# Patient Record
Sex: Female | Born: 1935 | Race: White | Hispanic: No | Marital: Married | Smoking: Never smoker
Health system: Southern US, Community
[De-identification: ages and names within clinical notes are randomized; demographics above are authoritative.]

## PROBLEM LIST (undated history)

## (undated) ENCOUNTER — Emergency Department: Discharge status: Absent without leave | Payer: Medicare Other

## (undated) DIAGNOSIS — E785 Hyperlipidemia, unspecified: Secondary | ICD-10-CM

## (undated) DIAGNOSIS — D72829 Elevated white blood cell count, unspecified: Secondary | ICD-10-CM

## (undated) DIAGNOSIS — N179 Acute kidney failure, unspecified: Secondary | ICD-10-CM

## (undated) DIAGNOSIS — F0390 Unspecified dementia without behavioral disturbance: Secondary | ICD-10-CM

## (undated) DIAGNOSIS — J189 Pneumonia, unspecified organism: Secondary | ICD-10-CM

## (undated) DIAGNOSIS — I10 Essential (primary) hypertension: Secondary | ICD-10-CM

## (undated) HISTORY — PX: ABDOMINAL HYSTERECTOMY: SHX81

---

## 1998-07-29 ENCOUNTER — Emergency Department (HOSPITAL_COMMUNITY): Admission: EM | Admit: 1998-07-29 | Discharge: 1998-07-29 | Payer: Self-pay | Admitting: *Deleted

## 2000-09-18 ENCOUNTER — Other Ambulatory Visit: Admission: RE | Admit: 2000-09-18 | Discharge: 2000-09-18 | Payer: Self-pay | Admitting: Family Medicine

## 2003-06-30 ENCOUNTER — Ambulatory Visit (HOSPITAL_COMMUNITY): Admission: RE | Admit: 2003-06-30 | Discharge: 2003-06-30 | Payer: Self-pay | Admitting: Gastroenterology

## 2018-04-30 ENCOUNTER — Other Ambulatory Visit: Payer: Self-pay

## 2018-04-30 ENCOUNTER — Observation Stay (HOSPITAL_COMMUNITY)
Admission: EM | Admit: 2018-04-30 | Discharge: 2018-05-02 | Disposition: A | Discharge status: Home / Self Care | Payer: Medicare Other | Attending: Internal Medicine | Admitting: Internal Medicine

## 2018-04-30 ENCOUNTER — Emergency Department (HOSPITAL_COMMUNITY): Payer: Medicare Other

## 2018-04-30 ENCOUNTER — Encounter (HOSPITAL_COMMUNITY): Payer: Self-pay

## 2018-04-30 DIAGNOSIS — J181 Lobar pneumonia, unspecified organism: Secondary | ICD-10-CM | POA: Diagnosis not present

## 2018-04-30 DIAGNOSIS — Z79899 Other long term (current) drug therapy: Secondary | ICD-10-CM | POA: Insufficient documentation

## 2018-04-30 DIAGNOSIS — I10 Essential (primary) hypertension: Secondary | ICD-10-CM | POA: Insufficient documentation

## 2018-04-30 DIAGNOSIS — K449 Diaphragmatic hernia without obstruction or gangrene: Secondary | ICD-10-CM | POA: Diagnosis not present

## 2018-04-30 DIAGNOSIS — D72829 Elevated white blood cell count, unspecified: Secondary | ICD-10-CM

## 2018-04-30 DIAGNOSIS — E876 Hypokalemia: Secondary | ICD-10-CM | POA: Diagnosis present

## 2018-04-30 DIAGNOSIS — J189 Pneumonia, unspecified organism: Principal | ICD-10-CM

## 2018-04-30 DIAGNOSIS — I700 Atherosclerosis of aorta: Secondary | ICD-10-CM | POA: Insufficient documentation

## 2018-04-30 DIAGNOSIS — N179 Acute kidney failure, unspecified: Secondary | ICD-10-CM | POA: Diagnosis not present

## 2018-04-30 DIAGNOSIS — Z90710 Acquired absence of both cervix and uterus: Secondary | ICD-10-CM | POA: Insufficient documentation

## 2018-04-30 DIAGNOSIS — Z7982 Long term (current) use of aspirin: Secondary | ICD-10-CM | POA: Insufficient documentation

## 2018-04-30 DIAGNOSIS — Z8249 Family history of ischemic heart disease and other diseases of the circulatory system: Secondary | ICD-10-CM | POA: Insufficient documentation

## 2018-04-30 HISTORY — DX: Elevated white blood cell count, unspecified: D72.829

## 2018-04-30 HISTORY — DX: Acute kidney failure, unspecified: N17.9

## 2018-04-30 HISTORY — DX: Pneumonia, unspecified organism: J18.9

## 2018-04-30 LAB — BASIC METABOLIC PANEL
ANION GAP: 13 (ref 5–15)
BUN: 32 mg/dL — ABNORMAL HIGH (ref 6–20)
CALCIUM: 8.6 mg/dL — AB (ref 8.9–10.3)
CO2: 23 mmol/L (ref 22–32)
CREATININE: 2.12 mg/dL — AB (ref 0.44–1.00)
Chloride: 96 mmol/L — ABNORMAL LOW (ref 101–111)
GFR, EST AFRICAN AMERICAN: 23 mL/min — AB (ref >60)
GFR, EST NON AFRICAN AMERICAN: 20 mL/min — AB (ref >60)
Glucose, Bld: 148 mg/dL — ABNORMAL HIGH (ref 65–99)
Potassium: 2.6 mmol/L — CL (ref 3.5–5.1)
Sodium: 132 mmol/L — ABNORMAL LOW (ref 135–145)

## 2018-04-30 LAB — CBC
HCT: 36.8 % (ref 36.0–46.0)
Hemoglobin: 12.6 g/dL (ref 12.0–15.0)
MCH: 30.9 pg (ref 26.0–34.0)
MCHC: 34.2 g/dL (ref 30.0–36.0)
MCV: 90.2 fL (ref 78.0–100.0)
PLATELETS: 335 10*3/uL (ref 150–400)
RBC: 4.08 MIL/uL (ref 3.87–5.11)
RDW: 13.4 % (ref 11.5–15.5)
WBC: 16.6 10*3/uL — ABNORMAL HIGH (ref 4.0–10.5)

## 2018-04-30 LAB — MAGNESIUM: MAGNESIUM: 2.2 mg/dL (ref 1.7–2.4)

## 2018-04-30 MED ORDER — ATORVASTATIN CALCIUM 40 MG PO TABS
40.0000 mg | ORAL_TABLET | Freq: Every day | ORAL | Status: DC
  Administered 2018-05-01: 40 mg via ORAL
  Filled 2018-04-30: qty 1

## 2018-04-30 MED ORDER — ROSUVASTATIN CALCIUM 20 MG PO TABS: 20.0000 mg | ORAL_TABLET | Freq: Every day | ORAL | Status: DC

## 2018-04-30 MED ORDER — ENOXAPARIN SODIUM 30 MG/0.3ML ~~LOC~~ SOLN
30.0000 mg | SUBCUTANEOUS | Status: DC
  Administered 2018-04-30 – 2018-05-01 (×2): 30 mg via SUBCUTANEOUS
  Filled 2018-04-30 (×2): qty 0.3

## 2018-04-30 MED ORDER — DOXYCYCLINE HYCLATE 100 MG PO TABS
100.0000 mg | ORAL_TABLET | Freq: Once | ORAL | Status: AC
  Administered 2018-04-30: 100 mg via ORAL
  Filled 2018-04-30: qty 1

## 2018-04-30 MED ORDER — SODIUM CHLORIDE 0.9 % IV SOLN
INTRAVENOUS | Status: DC
  Administered 2018-04-30: 17:00:00 via INTRAVENOUS

## 2018-04-30 MED ORDER — POTASSIUM CHLORIDE CRYS ER 20 MEQ PO TBCR
40.0000 meq | EXTENDED_RELEASE_TABLET | Freq: Once | ORAL | Status: AC
  Administered 2018-04-30: 40 meq via ORAL
  Filled 2018-04-30: qty 2

## 2018-04-30 MED ORDER — POTASSIUM CHLORIDE 10 MEQ/100ML IV SOLN
10.0000 meq | Freq: Once | INTRAVENOUS | Status: AC
  Administered 2018-04-30: 10 meq via INTRAVENOUS
  Filled 2018-04-30: qty 100

## 2018-04-30 MED ORDER — ASPIRIN EC 81 MG PO TBEC
81.0000 mg | DELAYED_RELEASE_TABLET | Freq: Every day | ORAL | Status: DC
  Administered 2018-05-01 – 2018-05-02 (×2): 81 mg via ORAL
  Filled 2018-04-30 (×2): qty 1

## 2018-04-30 MED ORDER — SODIUM CHLORIDE 0.9 % IV SOLN
1.0000 g | INTRAVENOUS | Status: DC
  Administered 2018-05-01: 1 g via INTRAVENOUS
  Filled 2018-04-30: qty 1
  Filled 2018-04-30: qty 10

## 2018-04-30 MED ORDER — SODIUM CHLORIDE 0.9 % IV SOLN
INTRAVENOUS | Status: AC
  Administered 2018-05-01: 01:00:00 via INTRAVENOUS

## 2018-04-30 MED ORDER — SODIUM CHLORIDE 0.9 % IV SOLN
1.0000 g | Freq: Once | INTRAVENOUS | Status: AC
  Administered 2018-04-30: 1 g via INTRAVENOUS
  Filled 2018-04-30: qty 10

## 2018-04-30 MED ORDER — PANTOPRAZOLE SODIUM 40 MG PO TBEC
40.0000 mg | DELAYED_RELEASE_TABLET | ORAL | Status: DC
  Administered 2018-05-01: 40 mg via ORAL
  Filled 2018-04-30: qty 1

## 2018-04-30 MED ORDER — POTASSIUM CHLORIDE CRYS ER 20 MEQ PO TBCR: 40.0000 meq | EXTENDED_RELEASE_TABLET | Freq: Once | ORAL | Status: DC

## 2018-04-30 NOTE — ED Notes (Signed)
Patient transported to X-ray 

## 2018-04-30 NOTE — ED Notes (Signed)
ED TO INPATIENT HANDOFF REPORT  Name/Age/Gender Mandy Adams 84 y.o. female  Code Status    Code Status Orders  (From admission, onward)        Start     Ordered   04/30/18 1801  Full code  Continuous     04/30/18 1801    Code Status History    This patient has a current code status but no historical code status.    Advance Directive Documentation     Most Recent Value  Type of Advance Directive  Healthcare Power of Attorney  Pre-existing out of facility DNR order (yellow form or pink MOST form)  -  "MOST" Form in Place?  -      Home/SNF/Other Home  Chief Complaint dry cough lack of energy  Level of Care/Admitting Diagnosis ED Disposition    ED Disposition Condition [place]: [facility] [100102]  Level of Care: Med-Surg [16]  Diagnosis: Hypokalemia [510258]  Admitting Physician: Georgette Shell [[id number]]  Attending Physician: Georgette Shell [[id number]]  PT Class (Do Not Modify): Observation [104]  PT Acc Code (Do Not Modify): Observation [10022]       Medical History Past Medical History:  Diagnosis Date  . AKI (acute kidney injury) ([place]) 04/30/2018  . Community acquired pneumonia 04/30/2018  . Leukocytosis 04/30/2018    Allergies No Known Allergies  IV Location/Drains/Wounds Patient Lines/Drains/Airways Status   Active Line/Drains/Airways    Name:   Placement date:   Placement time:   Site:   Days:   Peripheral IV 04/30/18 Right Antecubital   04/30/18    1648    Antecubital   less than 1          Labs/Imaging Results for orders placed or performed during the hospital encounter of 04/30/18 (from the past 48 hour(s))  Basic metabolic panel     Status: Abnormal   Collection Time: 04/30/18  2:39 PM  Result Value Ref Range   Sodium 132 (L) 135 - 145 mmol/L   Potassium 2.6 (LL) 3.5 - 5.1 mmol/L    Comment: CRITICAL RESULT CALLED TO, READ BACK BY AND VERIFIED WITH: Tsosie Billing [id number] @  1517 BY J SCOTTON    Chloride 96 (L) 101 - 111 mmol/L   CO2 23 22 - 32 mmol/L   Glucose, Bld 148 (H) 65 - 99 mg/dL   BUN 32 (H) 6 - 20 mg/dL   Creatinine, Ser 2.12 (H) 0.44 - 1.00 mg/dL   Calcium 8.6 (L) 8.9 - 10.3 mg/dL   GFR calc non Af Amer 20 (L) >60 mL/min   GFR calc Af Amer 23 (L) >60 mL/min    Comment: (NOTE) The eGFR has been calculated using the CKD EPI equation. This calculation has not been validated in all clinical situations. eGFR's persistently <60 mL/min signify possible Chronic Kidney Disease.    Anion gap 13 5 - 15    Comment: Performed at [facility], [place] [address] [place], [place] [address]  CBC     Status: Abnormal   Collection Time: 04/30/18  2:39 PM  Result Value Ref Range   WBC 16.6 (H) 4.0 - 10.5 K/uL   RBC 4.08 3.87 - 5.11 MIL/uL   Hemoglobin 12.6 12.0 - 15.0 g/dL   HCT 36.8 36.0 - 46.0 %   MCV 90.2 78.0 - 100.0 fL   MCH 30.9 26.0 - 34.0 pg   MCHC 34.2 30.0 - 36.0 g/dL   RDW 13.4  11.5 - 15.5 %   Platelets 335 150 - 400 K/uL    Comment: Performed at [facility], [place] [address] [place], [place] [address]  Magnesium     Status: None   Collection Time: 04/30/18  2:39 PM  Result Value Ref Range   Magnesium 2.2 1.7 - 2.4 mg/dL    Comment: Performed at [facility], [place] [address] [place], [place] [address]   Ct Chest Wo Contrast  Result Date: 04/30/2018 CLINICAL DATA:  84 year old female with cough and leukocytosis. Abdomen chest radiographs earlier today. EXAM: CT CHEST WITHOUT CONTRAST TECHNIQUE: Multidetector CT imaging of the chest was performed following the standard protocol without IV contrast. COMPARISON:  [facility] chest radiographs reported 1144 hours today. FINDINGS: Cardiovascular: Vascular patency is not evaluated in the absence of IV contrast. Calcified aortic atherosclerosis. Mild for age calcified coronary artery atherosclerosis. No cardiomegaly or pericardial effusion.  Mediastinum/Nodes: Very large left side diaphragmatic hernia with herniation of the stomach, splenic flexure, and mesentery into the left chest. The esophageal hiatus is also capacious and contributes to the hernia. Mass effect on the heart which is effaced anteriorly. The thoracic esophagus is nondilated. The herniated stomach demonstrates rotation such that the antrum is located cephalad and the cardia is located caudal - organo-axial rotation. The stomach is moderately distended with food and/or fluid, but does not appear acutely obstructed. The gastric outlet continues into the upper chest, where the proximal duodenum appears within normal limits. No free fluid or extraluminal gas within the herniation sac. No superimposed mediastinal lymphadenopathy. Lungs/Pleura: The major airways are patent. There is mass effect on the left hilum from the large thoracic hernia. There is right lower compressive atelectasis with mild associated consolidation along the hernia sac as seen on series 5, image 54. No left pleural effusion. No pneumothorax. There is mild lower lobe atelectasis or scarring in the right lung. Upper Abdomen: Large left thoracic hernia with contents described above. In the upper abdomen the noncontrast visible liver, gallbladder, spleen, pancreas, adrenal glands, and kidneys appear unremarkable. The proximal transverse colon remaining in the abdomen appears nondilated. The visible proximal jejunum appear normal. Musculoskeletal: Mild dextroconvex thoracic and levoconvex upper lumbar scoliosis. No acute osseous abnormality identified. IMPRESSION: 1. Large but probably chronic left diaphragmatic hernia containing the stomach and large bowel splenic flexure along with mesentery. There is organo-axial rotation of the stomach, but no evidence of gastric or bowel obstruction. The hernia contents do NOT appear inflamed. 2. Associated mass effect on the left lower lung and mediastinum. Left lower lobe  atelectasis. Mild left lower lobe pneumonia is difficult to exclude, but there is no pleural effusion. 3. Negative right lung. No cardiomegaly or pericardial effusion. Calcified coronary artery and Aortic Atherosclerosis (ICD10-I70.0). 4. Negative visible viscera which were remain within the upper abdomen. Electronically Signed   By: Genevie Ann M.D.   On: 04/30/2018 16:44    Pending Labs Unresulted Labs (From admission, onward)   Start     Ordered   05/01/18 0500  CBC  Tomorrow morning,   R     04/30/18 1803   05/01/18 2956  Basic metabolic panel  Tomorrow morning,   R     04/30/18 1803      Vitals/Pain Today's Vitals   04/30/18 1547 04/30/18 1635 04/30/18 1700 04/30/18 1812  BP: (!) 129/53 (!) 123/55 115/62 118/70  Pulse: 94 (!) 102 (!) 105 93  Resp: (!) 21 (!) 22 (!) 22 20  Temp:  TempSrc:      SpO2: 98% 99% 97% 100%  Weight:      Height:      PainSc:        Isolation Precautions No active isolations  Medications Medications  enoxaparin (LOVENOX) injection 30 mg (has no administration in time range)  aspirin EC tablet 81 mg (has no administration in time range)  rosuvastatin (CRESTOR) tablet 20 mg (has no administration in time range)  pantoprazole (PROTONIX) EC tablet 40 mg (has no administration in time range)  potassium chloride SA (K-DUR,KLOR-CON) CR tablet 40 mEq (has no administration in time range)  0.9 %  sodium chloride infusion (has no administration in time range)  cefTRIAXone (ROCEPHIN) 1 g in sodium chloride 0.9 % 100 mL IVPB (has no administration in time range)  potassium chloride 10 mEq in 100 mL IVPB (0 mEq Intravenous Stopped 04/30/18 1750)  potassium chloride SA (K-DUR,KLOR-CON) CR tablet 40 mEq (40 mEq Oral Given 04/30/18 1635)  cefTRIAXone (ROCEPHIN) 1 g in sodium chloride 0.9 % 100 mL IVPB (0 g Intravenous Stopped 04/30/18 1832)  doxycycline (VIBRA-TABS) tablet 100 mg (100 mg Oral Given 04/30/18 1754)    Mobility walks

## 2018-04-30 NOTE — ED Notes (Signed)
ED Provider at bedside. 

## 2018-04-30 NOTE — ED Provider Notes (Signed)
[facility]-EMERGENCY DEPT Provider Note   CSN: [id number] Arrival date & time: 04/30/18  1414     History   Chief Complaint Chief Complaint  Patient presents with  . Cough  . Fatigue    HPI Mandy Adams is a 84 y.o. female.  HPI Pt presents to the ED for evaluation of an abnormal CXR and cough.   Patient states she is been having trouble with a hacking cough and fatigue for the last 3 months.  Patient denies any trouble with any fevers.  The cough has mostly been dry but occasionally productive white sputum.  She denies any leg swelling.  She denies any chest pain.  No vomiting or diarrhea.  Patient went to her primary care doctor today and had outpatient x-ray and lab tests.  According to the notes from her doctor's office the chest x-ray showed a diaphragmatic hernia versus marked elevation left hemidiaphragm.  Chest CT scanning was recommended.  Patient was sent to the ED for that reason. History reviewed. No pertinent past medical history.  There are no active problems to display for this patient.   Past Surgical History:  Procedure Laterality Date  . ABDOMINAL HYSTERECTOMY       OB History   None      Home Medications    Prior to Admission medications   Not on File    Family History Family History  Problem Relation Age of Onset  . Heart failure Father     Social History Social History   Tobacco Use  . Smoking status: Never Smoker  . Smokeless tobacco: Never Used  Substance Use Topics  . Alcohol use: Never    Frequency: Never  . Drug use: Never     Allergies   Patient has no known allergies.   Review of Systems Review of Systems  All other systems reviewed and are negative.    Physical Exam Updated Vital Signs BP (!) 123/55   Pulse (!) 102   Temp 97.9 F (36.6 C) (Oral)   Resp (!) 22   Ht 1.524 m (5')   Wt 47.6 kg (105 lb)   SpO2 99%   BMI 20.51 kg/m   Physical Exam  Constitutional: No distress.    HENT:  Head: Normocephalic and atraumatic.  Right Ear: External ear normal.  Left Ear: External ear normal.  Eyes: Conjunctivae are normal. Right eye exhibits no discharge. Left eye exhibits no discharge. No scleral icterus.  Neck: Neck supple. No tracheal deviation present.  Cardiovascular: Normal rate, regular rhythm and intact distal pulses.  Pulmonary/Chest: Effort normal and breath sounds normal. No stridor. No respiratory distress. She has no wheezes. She has no rales.  Abdominal: Soft. Bowel sounds are normal. She exhibits no distension. There is no tenderness. There is no rebound and no guarding.  Musculoskeletal: She exhibits edema. She exhibits no tenderness.  Mild lower extremity  Neurological: She is alert. She has normal strength. No cranial nerve deficit (no facial droop, extraocular movements intact, no slurred speech) or sensory deficit. She exhibits normal muscle tone. She displays no seizure activity. Coordination normal.  Skin: Skin is warm and dry. No rash noted. She is not diaphoretic.  Psychiatric: She has a normal mood and affect.  Nursing note and vitals reviewed.    ED Treatments / Results  Labs (all labs ordered are listed, but only abnormal results are displayed) Labs Reviewed  BASIC METABOLIC PANEL - Abnormal; Notable for the following components:  Result Value   Sodium 132 (*)    Potassium 2.6 (*)    Chloride 96 (*)    Glucose, Bld 148 (*)    BUN 32 (*)    Creatinine, Ser 2.12 (*)    Calcium 8.6 (*)    GFR calc non Af Amer 20 (*)    GFR calc Af Amer 23 (*)    All other components within normal limits  CBC - Abnormal; Notable for the following components:   WBC 16.6 (*)    All other components within normal limits  MAGNESIUM    EKG EKG Interpretation  Date/Time:  Wednesday April 30 2018 16:34:50 EDT Ventricular Rate:  106 PR Interval:    QRS Duration: 110 QT Interval:  349 QTC Calculation: 464 R Axis:   25 Text Interpretation:  Sinus  tachycardia Low voltage, extremity and precordial leads Minimal ST elevation, inferior leads Baseline wander in lead(s) V2 No old tracing to compare Confirmed by Linwood Dibbles [phone]) on 04/30/2018 4:46:34 PM   Radiology Ct Chest Wo Contrast  Result Date: 04/30/2018 CLINICAL DATA:  84 year old female with cough and leukocytosis. Abdomen chest radiographs earlier today. EXAM: CT CHEST WITHOUT CONTRAST TECHNIQUE: Multidetector CT imaging of the chest was performed following the standard protocol without IV contrast. COMPARISON:  [facility] chest radiographs reported 1144 hours today. FINDINGS: Cardiovascular: Vascular patency is not evaluated in the absence of IV contrast. Calcified aortic atherosclerosis. Mild for age calcified coronary artery atherosclerosis. No cardiomegaly or pericardial effusion. Mediastinum/Nodes: Very large left side diaphragmatic hernia with herniation of the stomach, splenic flexure, and mesentery into the left chest. The esophageal hiatus is also capacious and contributes to the hernia. Mass effect on the heart which is effaced anteriorly. The thoracic esophagus is nondilated. The herniated stomach demonstrates rotation such that the antrum is located cephalad and the cardia is located caudal - organo-axial rotation. The stomach is moderately distended with food and/or fluid, but does not appear acutely obstructed. The gastric outlet continues into the upper chest, where the proximal duodenum appears within normal limits. No free fluid or extraluminal gas within the herniation sac. No superimposed mediastinal lymphadenopathy. Lungs/Pleura: The major airways are patent. There is mass effect on the left hilum from the large thoracic hernia. There is right lower compressive atelectasis with mild associated consolidation along the hernia sac as seen on series 5, image 54. No left pleural effusion. No pneumothorax. There is mild lower lobe atelectasis or scarring in the right lung. Upper  Abdomen: Large left thoracic hernia with contents described above. In the upper abdomen the noncontrast visible liver, gallbladder, spleen, pancreas, adrenal glands, and kidneys appear unremarkable. The proximal transverse colon remaining in the abdomen appears nondilated. The visible proximal jejunum appear normal. Musculoskeletal: Mild dextroconvex thoracic and levoconvex upper lumbar scoliosis. No acute osseous abnormality identified. IMPRESSION: 1. Large but probably chronic left diaphragmatic hernia containing the stomach and large bowel splenic flexure along with mesentery. There is organo-axial rotation of the stomach, but no evidence of gastric or bowel obstruction. The hernia contents do NOT appear inflamed. 2. Associated mass effect on the left lower lung and mediastinum. Left lower lobe atelectasis. Mild left lower lobe pneumonia is difficult to exclude, but there is no pleural effusion. 3. Negative right lung. No cardiomegaly or pericardial effusion. Calcified coronary artery and Aortic Atherosclerosis (ICD10-I70.0). 4. Negative visible viscera which were remain within the upper abdomen. Electronically Signed   By: Odessa Fleming M.D.   On: 04/30/2018 16:44  Procedures Procedures (including critical care time)  Medications Ordered in ED Medications  0.9 %  sodium chloride infusion ( Intravenous New Bag/Given 04/30/18 1649)  potassium chloride 10 mEq in 100 mL IVPB (10 mEq Intravenous New Bag/Given 04/30/18 1649)  cefTRIAXone (ROCEPHIN) 1 g in sodium chloride 0.9 % 100 mL IVPB (has no administration in time range)  doxycycline (VIBRA-TABS) tablet 100 mg (has no administration in time range)  potassium chloride SA (K-DUR,KLOR-CON) CR tablet 40 mEq (40 mEq Oral Given 04/30/18 1635)     Initial Impression / Assessment and Plan / ED Course  I have reviewed the triage vital signs and the nursing notes.  Pertinent labs & imaging results that were available during my care of the patient were reviewed  by me and considered in my medical decision making (see chart for details).  Clinical Course as of May 01 1711  Wed Apr 30, 2018  1554 No records available in care everywhere   [JK]    Clinical Course User Index [JK] Linwood Dibbles, MD    Patient presented to the emergency room for evaluation of an abnormal chest x-ray and a chronic cough.  Patient's laboratory tests are notable for hypokalemia as well as an elevated BUN and creatinine.  No old labs are available to see if this is chronic.  Patient is not aware that she has chronic kidney disease.  Patient does have a leukocytosis.  This raises the concern of infection.  CT scan of her chest demonstrates a diaphragmatic hernia which is most likely chronic however that cannot exclude pneumonia.  With her persistent cough, elevated white blood cell count, elevated BUN and creatinine, hypokalemia and her age I think she is poor candidate for outpatient treatment.  I will consult the medical service for admission and IV antibiotics and further evaluation  Final Clinical Impressions(s) / ED Diagnoses   Final diagnoses:  Community acquired pneumonia of left lung, unspecified part of lung  Diaphragmatic hernia without obstruction and without gangrene  Hypokalemia      Linwood Dibbles, MD 04/30/18 1713

## 2018-04-30 NOTE — H&P (Signed)
History and Physical    Mandy Adams ZOX:[id number] DOB: 1931/12/29 DOA: 04/30/2018  PCP: Daisy Floro, MD Patient coming from: Home  Chief Complaint: Cough and shortness of breath  84 year old very pleasant young lady admitted with cough elevated white count and abnormal chest x-ray from the PCPs office.  Patient reports having a Hacking cough for the last 4 weeks.  She denies any fever or chills.  The cough is mostly dry but occasionally brings up white phlegm.  She denies any chest pain she does complain of shortness of breath.  Denies syncope, nausea, vomiting, diarrhea.  Patient lives alone at home.  A chest x-ray was done at the PCPs office that showed a diaphragmatic hernia versus marketed elevation of the left hemidiaphragm she was sent into the ER to have a chest CT done.   ED Course: Patient received antibiotics ceftriaxone, doxycycline, she was started on normal saline 125 cc an hour, she received potassium 40 mEq p.o. and 10 mEq IV.  Vital signs slightly tachycardic at 102 blood pressure 12/04/1951 respiration 22 saturation 99% on room air temperature 97.9.  Review of Systems: As per HPI otherwise all other systems reviewed and are negative  Ambulatory Status: She is ambulatory at home.  History reviewed. No pertinent past medical history.  Past Surgical History:  Procedure Laterality Date  . ABDOMINAL HYSTERECTOMY      Social History   Socioeconomic History  . Marital status: Married    Spouse name: Not on file  . Number of children: Not on file  . Years of education: Not on file  . Highest education level: Not on file  Occupational History  . Not on file  Social Needs  . Financial resource strain: Not on file  . Food insecurity:    Worry: Not on file    Inability: Not on file  . Transportation needs:    Medical: Not on file    Non-medical: Not on file  Tobacco Use  . Smoking status: Never Smoker  . Smokeless tobacco: Never Used  Substance and  Sexual Activity  . Alcohol use: Never    Frequency: Never  . Drug use: Never  . Sexual activity: Not on file  Lifestyle  . Physical activity:    Days per week: Not on file    Minutes per session: Not on file  . Stress: Not on file  Relationships  . Social connections:    Talks on phone: Not on file    Gets together: Not on file    Attends religious service: Not on file    Active member of club or organization: Not on file    Attends meetings of clubs or organizations: Not on file    Relationship status: Not on file  . Intimate partner violence:    Fear of current or ex partner: Not on file    Emotionally abused: Not on file    Physically abused: Not on file    Forced sexual activity: Not on file  Other Topics Concern  . Not on file  Social History Narrative  . Not on file    No Known Allergies  Family History  Problem Relation Age of Onset  . Heart failure Father     Prior to Admission medications   Medication Sig Start Date End Date Taking? Authorizing Provider  acetaminophen (TYLENOL) 500 MG tablet Take 1,000 mg by mouth 2 (two) times daily as needed for moderate pain or headache.   Yes [provider]  aspirin EC 81 MG tablet Take 81 mg by mouth daily.   Yes [provider]  clobetasol cream (TEMOVATE) 0.05 % Apply 1 application topically 2 (two) times daily.   Yes [provider]  estradiol (ESTRACE) 0.1 MG/GM vaginal cream Place 1 Applicatorful vaginally 3 (three) times a week.   Yes [provider]  hydrochlorothiazide (HYDRODIURIL) 25 MG tablet Take 25 mg by mouth daily. 02/04/18  Yes [provider]  meclizine (ANTIVERT) 25 MG tablet Take 25 mg by mouth daily as needed for dizziness.   Yes [provider]  pantoprazole (PROTONIX) 40 MG tablet Take 40 mg by mouth every other day. 02/04/18  Yes [provider]  rosuvastatin (CRESTOR) 20 MG tablet Take 20 mg by mouth daily.   Yes [provider]    TOPROL XL 25 MG 24 hr tablet Take 25 mg by mouth daily. 02/04/18  Yes [provider]    Physical Exam: Vitals:   04/30/18 1430 04/30/18 1547 04/30/18 1635 04/30/18 1700  BP:  (!) 129/53 (!) 123/55 115/62  Pulse:  94 (!) 102 (!) 105  Resp:  (!) 21 (!) 22 (!) 22  Temp:      TempSrc:      SpO2:  98% 99% 97%  Weight: 47.6 kg (105 lb)     Height: 5' (1.524 m)        General: Appears calm and comfortable Eyes: PERRL, EOMI, normal lids, iris ENT:  grossly normal hearing, lips & tongue, mmm Neck:  no LAD, masses or thyromegaly Cardiovascular: RRR, no m/r/g. No LE edema.  Respiratory: Decreased breath sounds at the bases. Abdomen: soft, ntnd, NABS Skin:  no rash or induration seen on limited exam Musculoskeletal:  grossly normal tone BUE/BLE, good ROM, no bony abnormality Psychiatric:  grossly normal mood and affect, speech fluent and appropriate, AOx3 Neurologic: CN 2-12 grossly intact, moves all extremities in coordinated fashion, sensation intact  Labs on Admission: I have personally reviewed following labs and imaging studies  CBC: Recent Labs  Lab 04/30/18 1439  WBC 16.6*  HGB 12.6  HCT 36.8  MCV 90.2  PLT 335   Basic Metabolic Panel: Recent Labs  Lab 04/30/18 1439  NA 132*  K 2.6*  CL 96*  CO2 23  GLUCOSE 148*  BUN 32*  CREATININE 2.12*  CALCIUM 8.6*  MG 2.2   GFR: Estimated Creatinine Clearance: 14.2 mL/min (A) (by C-G formula based on SCr of 2.12 mg/dL (H)). Liver Function Tests: No results for input(s): AST, ALT, ALKPHOS, BILITOT, PROT, ALBUMIN in the last 168 hours. No results for input(s): LIPASE, AMYLASE in the last 168 hours. No results for input(s): AMMONIA in the last 168 hours. Coagulation Profile: No results for input(s): INR, PROTIME in the last 168 hours. Cardiac Enzymes: No results for input(s): CKTOTAL, CKMB, CKMBINDEX, TROPONINI in the last 168 hours. BNP (last 3 results) No results for input(s): PROBNP in the last 8760  hours. HbA1C: No results for input(s): HGBA1C in the last 72 hours. CBG: No results for input(s): GLUCAP in the last 168 hours. Lipid Profile: No results for input(s): CHOL, HDL, LDLCALC, TRIG, CHOLHDL, LDLDIRECT in the last 72 hours. Thyroid Function Tests: No results for input(s): TSH, T4TOTAL, FREET4, T3FREE, THYROIDAB in the last 72 hours. Anemia Panel: No results for input(s): VITAMINB12, FOLATE, FERRITIN, TIBC, IRON, RETICCTPCT in the last 72 hours. Urine analysis: No results found for: COLORURINE, APPEARANCEUR, LABSPEC, PHURINE, GLUCOSEU, HGBUR, BILIRUBINUR, KETONESUR, PROTEINUR, UROBILINOGEN, NITRITE, LEUKOCYTESUR  Creatinine Clearance:  Estimated Creatinine Clearance: 14.2 mL/min (A) (by C-G formula based on SCr of 2.12 mg/dL (H)).  Sepsis Labs: @LABRCNTIP (procalcitonin:4,lacticidven:4) )No results found for this or any previous visit (from the past 240 hour(s)).   Radiological Exams on Admission: Ct Chest Wo Contrast  Result Date: 04/30/2018 CLINICAL DATA:  84 year old female with cough and leukocytosis. Abdomen chest radiographs earlier today. EXAM: CT CHEST WITHOUT CONTRAST TECHNIQUE: Multidetector CT imaging of the chest was performed following the standard protocol without IV contrast. COMPARISON:  [facility] chest radiographs reported 1144 hours today. FINDINGS: Cardiovascular: Vascular patency is not evaluated in the absence of IV contrast. Calcified aortic atherosclerosis. Mild for age calcified coronary artery atherosclerosis. No cardiomegaly or pericardial effusion. Mediastinum/Nodes: Very large left side diaphragmatic hernia with herniation of the stomach, splenic flexure, and mesentery into the left chest. The esophageal hiatus is also capacious and contributes to the hernia. Mass effect on the heart which is effaced anteriorly. The thoracic esophagus is nondilated. The herniated stomach demonstrates rotation such that the antrum is located cephalad and the cardia is  located caudal - organo-axial rotation. The stomach is moderately distended with food and/or fluid, but does not appear acutely obstructed. The gastric outlet continues into the upper chest, where the proximal duodenum appears within normal limits. No free fluid or extraluminal gas within the herniation sac. No superimposed mediastinal lymphadenopathy. Lungs/Pleura: The major airways are patent. There is mass effect on the left hilum from the large thoracic hernia. There is right lower compressive atelectasis with mild associated consolidation along the hernia sac as seen on series 5, image 54. No left pleural effusion. No pneumothorax. There is mild lower lobe atelectasis or scarring in the right lung. Upper Abdomen: Large left thoracic hernia with contents described above. In the upper abdomen the noncontrast visible liver, gallbladder, spleen, pancreas, adrenal glands, and kidneys appear unremarkable. The proximal transverse colon remaining in the abdomen appears nondilated. The visible proximal jejunum appear normal. Musculoskeletal: Mild dextroconvex thoracic and levoconvex upper lumbar scoliosis. No acute osseous abnormality identified. IMPRESSION: 1. Large but probably chronic left diaphragmatic hernia containing the stomach and large bowel splenic flexure along with mesentery. There is organo-axial rotation of the stomach, but no evidence of gastric or bowel obstruction. The hernia contents do NOT appear inflamed. 2. Associated mass effect on the left lower lung and mediastinum. Left lower lobe atelectasis. Mild left lower lobe pneumonia is difficult to exclude, but there is no pleural effusion. 3. Negative right lung. No cardiomegaly or pericardial effusion. Calcified coronary artery and Aortic Atherosclerosis (ICD10-I70.0). 4. Negative visible viscera which were remain within the upper abdomen. Electronically Signed   By: Odessa FlemingH  Hall M.D.   On: 04/30/2018 16:44    Assessment/Plan Active Problems:   * No  active hospital problems. *   1] cough with abnormal chest x-ray and abnormal CT scan of the chest-CT showed large chronic left diaphragmatic hernia containing stomach and large bowel at the splenic flexure along with mesentery.  No evidence of gastric or bowel obstruction.  Associated mass-effect on the left lower lung and mediastinum.  Left lower lobe atelectasis.  Mild left lower lobe pneumonia is difficult to exclude there is no pleural effusion.  Negative right lung.  I will give her Rocephin daily and changed to p.o. very quickly as patient is basically having a dry cough with no fever.  She does have leukocytosis at 16.6 possibly related to dehydration versus reactive versus infection.  We will follow-up tomorrow.  2] AKI with a  creatinine of 2.12 we do not have a baseline however I will give her IV hydration and recheck levels tomorrow.  Hold HCTZ her blood pressure is soft.  3] hypokalemia replete and recheck tomorrow.  Magnesium normal at 2.2.    DVT prophylaxis: lovenox Code Status: Full code Family Communication: Discussed with son Disposition Plan: TBD Consults called: None Admission status: Observation   Alwyn Ren MD [facility]  If 7PM-7AM, please contact night-coverage [email or web address] Password [facility]  04/30/2018, 5:52 PM

## 2018-04-30 NOTE — ED Triage Notes (Signed)
Patient c/o dry hacking cough and fatigue x 3 months. Patient went to PCP today and had blood work and a CXR. Patient was sent to the ED for a CT scan. CXR showed a hernia and patient had an elevated WBC-18.2.

## 2018-04-30 NOTE — Progress Notes (Signed)
Pt gave permission to ask questions on the nursing admission history with her son present in the room. Mandy Adams. [name] BSN, RN-BC Admissions RN 04/30/2018 6:14 PM

## 2018-05-01 DIAGNOSIS — N179 Acute kidney failure, unspecified: Secondary | ICD-10-CM | POA: Diagnosis not present

## 2018-05-01 DIAGNOSIS — J189 Pneumonia, unspecified organism: Secondary | ICD-10-CM | POA: Diagnosis not present

## 2018-05-01 DIAGNOSIS — K449 Diaphragmatic hernia without obstruction or gangrene: Secondary | ICD-10-CM | POA: Diagnosis not present

## 2018-05-01 DIAGNOSIS — E876 Hypokalemia: Secondary | ICD-10-CM | POA: Diagnosis not present

## 2018-05-01 LAB — BASIC METABOLIC PANEL
ANION GAP: 9 (ref 5–15)
BUN: 27 mg/dL — AB (ref 6–20)
CALCIUM: 7.9 mg/dL — AB (ref 8.9–10.3)
CO2: 21 mmol/L — AB (ref 22–32)
Chloride: 107 mmol/L (ref 101–111)
Creatinine, Ser: 1.47 mg/dL — ABNORMAL HIGH (ref 0.44–1.00)
GFR calc Af Amer: 37 mL/min — ABNORMAL LOW (ref >60)
GFR calc non Af Amer: 32 mL/min — ABNORMAL LOW (ref >60)
GLUCOSE: 109 mg/dL — AB (ref 65–99)
Potassium: 2.8 mmol/L — ABNORMAL LOW (ref 3.5–5.1)
Sodium: 137 mmol/L (ref 135–145)

## 2018-05-01 LAB — CBC
HEMATOCRIT: 33.6 % — AB (ref 36.0–46.0)
Hemoglobin: 11.4 g/dL — ABNORMAL LOW (ref 12.0–15.0)
MCH: 30 pg (ref 26.0–34.0)
MCHC: 33.9 g/dL (ref 30.0–36.0)
MCV: 88.4 fL (ref 78.0–100.0)
Platelets: 312 10*3/uL (ref 150–400)
RBC: 3.8 MIL/uL — ABNORMAL LOW (ref 3.87–5.11)
RDW: 13.4 % (ref 11.5–15.5)
WBC: 12.3 10*3/uL — AB (ref 4.0–10.5)

## 2018-05-01 MED ORDER — POTASSIUM CHLORIDE CRYS ER 20 MEQ PO TBCR: 40.0000 meq | EXTENDED_RELEASE_TABLET | ORAL | Status: DC

## 2018-05-01 MED ORDER — POTASSIUM CHLORIDE 10 MEQ/100ML IV SOLN
10.0000 meq | INTRAVENOUS | Status: AC
  Administered 2018-05-01 (×2): 10 meq via INTRAVENOUS
  Filled 2018-05-01 (×2): qty 100

## 2018-05-01 MED ORDER — BOOST PLUS PO LIQD
237.0000 mL | Freq: Two times a day (BID) | ORAL | Status: DC
  Administered 2018-05-01 – 2018-05-02 (×2): 237 mL via ORAL
  Filled 2018-05-01 (×3): qty 237

## 2018-05-01 MED ORDER — POLYETHYLENE GLYCOL 3350 17 G PO PACK
17.0000 g | PACK | Freq: Every day | ORAL | Status: DC | PRN
  Administered 2018-05-01: 17 g via ORAL
  Filled 2018-05-01: qty 1

## 2018-05-01 MED ORDER — POTASSIUM CHLORIDE CRYS ER 20 MEQ PO TBCR
40.0000 meq | EXTENDED_RELEASE_TABLET | ORAL | Status: AC
  Administered 2018-05-01 (×2): 40 meq via ORAL
  Filled 2018-05-01 (×2): qty 2

## 2018-05-01 NOTE — Progress Notes (Addendum)
PROGRESS NOTE    Mandy SimmerGeraldine Bonner  ZOX:[id number] DOB: 04/30/1932 DOA: 04/30/2018 PCP: Daisy Florooss, Charles Alan, MD Brief Narrative[age] year old very pleasant young lady admitted with cough elevated white count and abnormal chest x-ray from the PCPs office.  Patient reports having a Hacking cough for the last 4 weeks.  She denies any fever or chills.  The cough is mostly dry but occasionally brings up white phlegm.  She denies any chest pain she does complain of shortness of breath.  Denies syncope, nausea, vomiting, diarrhea.  Patient lives alone at home.  A chest x-ray was done at the PCPs office that showed a diaphragmatic hernia versus marketed elevation of the left hemidiaphragm she was sent into the ER to have a chest CT done.   ED Course: Patient received antibiotics ceftriaxone, doxycycline, she was started on normal saline 125 cc an hour, she received potassium 40 mEq p.o. and 10 mEq IV.  Vital signs slightly tachycardic at 102 blood pressure 12/04/1951 respiration 22 saturation 99% on room air temperature 97.9.      Assessment & Plan:   Active Problems:   Hypokalemia   Community acquired pneumonia   Leukocytosis   AKI (acute kidney injury) (HCC)  1]CAP-continue Rocephin for now plan discharge home tomorrow on p.o. antibiotics to finish a course of 5 days.  2] large chronic left diaphragmatic hernia will need surgical follow-up as an outpatient.  3] hypokalemia replete and recheck tomorrow.  4] AKI improved with hydration and holding HCTZ.    DVT prophylaxis: Lovenox Code Status: Full code Family Communication: No family available today Disposition Plan: Plan discharge tomorrowConsultants: None   Procedures: None Antimicrobials: Rocephin  Subjective: Feeling better denies any specific complaints cough is better  Objective: Vitals:   04/30/18 1905 04/30/18 1926 04/30/18 2058 05/01/18 0544  BP: 129/70 126/68 135/60 110/64  Pulse: 89 90 98 96  Resp: (!) 21 (!) 21 20     Temp:  99.9 F (37.7 C) 98.6 F (37 C) 100.3 F (37.9 C)  TempSrc:  Oral Oral Oral  SpO2: 98% 99% 97% 96%  Weight:      Height:        Intake/Output Summary (Last 24 hours) at 05/01/2018 1124 Last data filed at 05/01/2018 0911 Gross per 24 hour  Intake 760.83 ml  Output -  Net 760.83 ml   Filed Weights   04/30/18 1430  Weight: 47.6 kg (105 lb)    Examination:  General exam: Appears calm and comfortable  Respiratory system: Few rhonchi LLB auscultation. Respiratory effort normal. Cardiovascular system: S1 & S2 heard, RRR. No JVD, murmurs, rubs, gallops or clicks. No pedal edema. Gastrointestinal system: Abdomen is nondistended, soft and nontender. No organomegaly or masses felt. Normal bowel sounds heard. Central nervous system: Alert and oriented. No focal neurological deficits. Extremities: Symmetric 5 x 5 power. Skin: No rashes, lesions or ulcers Psychiatry: Judgement and insight appear normal. Mood & affect appropriate.     Data Reviewed: I have personally reviewed following labs and imaging studies  CBC: Recent Labs  Lab 04/30/18 1439 05/01/18 0549  WBC 16.6* 12.3*  HGB 12.6 11.4*  HCT 36.8 33.6*  MCV 90.2 88.4  PLT 335 312   Basic Metabolic Panel: Recent Labs  Lab 04/30/18 1439 05/01/18 0549  NA 132* 137  K 2.6* 2.8*  CL 96* 107  CO2 23 21*  GLUCOSE 148* 109*  BUN 32* 27*  CREATININE 2.12* 1.47*  CALCIUM 8.6* 7.9*  MG 2.2  --    GFR:  Estimated Creatinine Clearance: 20.5 mL/min (A) (by C-G formula based on SCr of 1.47 mg/dL (H)). Liver Function Tests: No results for input(s): AST, ALT, ALKPHOS, BILITOT, PROT, ALBUMIN in the last 168 hours. No results for input(s): LIPASE, AMYLASE in the last 168 hours. No results for input(s): AMMONIA in the last 168 hours. Coagulation Profile: No results for input(s): INR, PROTIME in the last 168 hours. Cardiac Enzymes: No results for input(s): CKTOTAL, CKMB, CKMBINDEX, TROPONINI in the last 168 hours. BNP  (last 3 results) No results for input(s): PROBNP in the last 8760 hours. HbA1C: No results for input(s): HGBA1C in the last 72 hours. CBG: No results for input(s): GLUCAP in the last 168 hours. Lipid Profile: No results for input(s): CHOL, HDL, LDLCALC, TRIG, CHOLHDL, LDLDIRECT in the last 72 hours. Thyroid Function Tests: No results for input(s): TSH, T4TOTAL, FREET4, T3FREE, THYROIDAB in the last 72 hours. Anemia Panel: No results for input(s): VITAMINB12, FOLATE, FERRITIN, TIBC, IRON, RETICCTPCT in the last 72 hours. Sepsis Labs: No results for input(s): PROCALCITON, LATICACIDVEN in the last 168 hours.  No results found for this or any previous visit (from the past 240 hour(s)).       Radiology Studies: Ct Chest Wo Contrast  Result Date: 04/30/2018 CLINICAL DATA:  84 year old female with cough and leukocytosis. Abdomen chest radiographs earlier today. EXAM: CT CHEST WITHOUT CONTRAST TECHNIQUE: Multidetector CT imaging of the chest was performed following the standard protocol without IV contrast. COMPARISON:  [facility] chest radiographs reported 1144 hours today. FINDINGS: Cardiovascular: Vascular patency is not evaluated in the absence of IV contrast. Calcified aortic atherosclerosis. Mild for age calcified coronary artery atherosclerosis. No cardiomegaly or pericardial effusion. Mediastinum/Nodes: Very large left side diaphragmatic hernia with herniation of the stomach, splenic flexure, and mesentery into the left chest. The esophageal hiatus is also capacious and contributes to the hernia. Mass effect on the heart which is effaced anteriorly. The thoracic esophagus is nondilated. The herniated stomach demonstrates rotation such that the antrum is located cephalad and the cardia is located caudal - organo-axial rotation. The stomach is moderately distended with food and/or fluid, but does not appear acutely obstructed. The gastric outlet continues into the upper chest, where the  proximal duodenum appears within normal limits. No free fluid or extraluminal gas within the herniation sac. No superimposed mediastinal lymphadenopathy. Lungs/Pleura: The major airways are patent. There is mass effect on the left hilum from the large thoracic hernia. There is right lower compressive atelectasis with mild associated consolidation along the hernia sac as seen on series 5, image 54. No left pleural effusion. No pneumothorax. There is mild lower lobe atelectasis or scarring in the right lung. Upper Abdomen: Large left thoracic hernia with contents described above. In the upper abdomen the noncontrast visible liver, gallbladder, spleen, pancreas, adrenal glands, and kidneys appear unremarkable. The proximal transverse colon remaining in the abdomen appears nondilated. The visible proximal jejunum appear normal. Musculoskeletal: Mild dextroconvex thoracic and levoconvex upper lumbar scoliosis. No acute osseous abnormality identified. IMPRESSION: 1. Large but probably chronic left diaphragmatic hernia containing the stomach and large bowel splenic flexure along with mesentery. There is organo-axial rotation of the stomach, but no evidence of gastric or bowel obstruction. The hernia contents do NOT appear inflamed. 2. Associated mass effect on the left lower lung and mediastinum. Left lower lobe atelectasis. Mild left lower lobe pneumonia is difficult to exclude, but there is no pleural effusion. 3. Negative right lung. No cardiomegaly or pericardial effusion. Calcified coronary artery and  Aortic Atherosclerosis (ICD10-I70.0). 4. Negative visible viscera which were remain within the upper abdomen. Electronically Signed   By: Odessa Fleming M.D.   On: 04/30/2018 16:44        Scheduled Meds: . aspirin EC  81 mg Oral Daily  . atorvastatin  40 mg Oral q1800  . enoxaparin (LOVENOX) injection  30 mg Subcutaneous Q24H  . pantoprazole  40 mg Oral QODAY  . potassium chloride  40 mEq Oral Q4H   Continuous  Infusions: . cefTRIAXone (ROCEPHIN)  IV       LOS: 0 days     Alwyn Ren, MD [facility] If 7PM-7AM, please contact night-coverage [email or web address] Password [facility] 05/01/2018, 11:24 AM

## 2018-05-01 NOTE — Progress Notes (Signed)
RN texted paged MD to clarify potassium orders for both IV and PO. Waiting for MD response at this time.

## 2018-05-01 NOTE — Progress Notes (Signed)
Nutrition Brief Note  Patient identified on the Malnutrition Screening Tool (MST) Report  Patient reports no issues with her appetite and eating 3 meals a day at home. States the main change is that she doesn't cook for herself that much since her husband passed away. Pt states she was told she had weight loss but states her UBW is 105 lb. Pt does drink Boosts at home, so will order those per pt request.  Wt Readings from Last 15 Encounters:  04/30/18 105 lb (47.6 kg)    Body mass index is 20.51 kg/m. Patient meets criteria for normal based on current BMI.   Current diet order is regular, patient is consuming approximately 85% of meals at this time. Labs and medications reviewed.   No nutrition interventions warranted at this time. If nutrition issues arise, please consult RD.   Tilda FrancoLindsey Averill Winters, MS, RD, LDN [facility] Clinical Dietitian Pager: [phone] After Hours Pager: [phone]

## 2018-05-01 NOTE — Care Management Obs Status (Signed)
MEDICARE OBSERVATION STATUS NOTIFICATION   Patient Details  Name: Mandy Adams MRN: [id number] Date of Birth: 09-Sep-1932   Medicare Observation Status Notification Given:  Yes    Golda AcreDavis, Emiliana Blaize Lynn, RN 05/01/2018, 10:12 AM

## 2018-05-02 DIAGNOSIS — J189 Pneumonia, unspecified organism: Secondary | ICD-10-CM | POA: Diagnosis not present

## 2018-05-02 DIAGNOSIS — K449 Diaphragmatic hernia without obstruction or gangrene: Secondary | ICD-10-CM | POA: Diagnosis not present

## 2018-05-02 DIAGNOSIS — E876 Hypokalemia: Secondary | ICD-10-CM | POA: Diagnosis not present

## 2018-05-02 DIAGNOSIS — N179 Acute kidney failure, unspecified: Secondary | ICD-10-CM | POA: Diagnosis not present

## 2018-05-02 LAB — BASIC METABOLIC PANEL
ANION GAP: 7 (ref 5–15)
BUN: 24 mg/dL — ABNORMAL HIGH (ref 6–20)
CALCIUM: 8.5 mg/dL — AB (ref 8.9–10.3)
CO2: 25 mmol/L (ref 22–32)
CREATININE: 1.15 mg/dL — AB (ref 0.44–1.00)
Chloride: 109 mmol/L (ref 101–111)
GFR calc non Af Amer: 42 mL/min — ABNORMAL LOW (ref >60)
GFR, EST AFRICAN AMERICAN: 49 mL/min — AB (ref >60)
Glucose, Bld: 98 mg/dL (ref 65–99)
Potassium: 4.4 mmol/L (ref 3.5–5.1)
Sodium: 141 mmol/L (ref 135–145)

## 2018-05-02 MED ORDER — AMOXICILLIN-POT CLAVULANATE 500-125 MG PO TABS: 500.0000 mg | ORAL_TABLET | Freq: Three times a day (TID) | ORAL | 0 refills | Status: DC

## 2018-05-02 NOTE — Discharge Summary (Signed)
Physician Discharge Summary  Mandy Adams JWJ:[id number] DOB: [birth year] DOA: 04/30/2018  PCP: Daisy Floro, MD  Admit date: 04/30/2018 Discharge date: 05/02/2018  Admitted From:HOME Disposition: HOME Recommendations for Outpatient Follow-up:  1. Follow up with PCP in 1-2 weeks 2. Please obtain BMP/CBC in one week  Home Health:NONE Equipment/Devices NONE Discharge Condition STABLE CODE STATUS:FULL Diet recommendation: CARDIAC  Brief/Interim Summary:84 year old very pleasant young lady admitted with cough elevated white count and abnormal chest x-ray from the PCPs office.  Patient reports having a Hacking cough for the last 4 weeks.  She denies any fever or chills.  The cough is mostly dry but occasionally brings up white phlegm.  She denies any chest pain she does complain of shortness of breath.  Denies syncope, nausea, vomiting, diarrhea.  Patient lives alone at home.  A chest x-ray was done at the PCPs office that showed a diaphragmatic hernia versus marketed elevation of the left hemidiaphragm she was sent into the ER to have a chest CT done.   ED Course: Patient received antibiotics ceftriaxone, doxycycline, she was started on normal saline 125 cc an hour, she received potassium 40 mEq p.o. and 10 mEq IV.  Vital signs slightly tachycardic at 102 blood pressure 12/04/1951 respiration 22 saturation 99% on room air temperature 97.9.   Discharge Diagnoses:  Active Problems:   Hypokalemia   Community acquired pneumonia   Leukocytosis   AKI (acute kidney injury) (HCC)  1] cough with abnormal chest x-ray and abnormal CT scan of the chest-CT showed large chronic left diaphragmatic hernia containing stomach and large bowel at the splenic flexure along with mesentery.  No evidence of gastric or bowel obstruction.  Associated mass-effect on the left lower lung and mediastinum.  Left lower lobe atelectasis.  Mild left lower lobe pneumonia is difficult to exclude there is no pleural  effusion.  Negative right lung.  She was treated with Rocephin empirically with improvement in her cough.  She was afebrile during her hospital stay.  She will be discharged on Augmentin for 4 more days.  Her WBC count was 16.6 on admission came down to 12.3 upon discharge.   2] AKI with a creatinine of 2.12 on admission she was hydrated with IV fluids creatinine came down to 1.15.  HCTZ was on hold during the hospital stay.   3] hypokalemia repleted.  Magnesium normal at 2.2.  4] hypertension her blood pressure was soft normal during the hospital stay so HCTZ was on hold..  Continue for now she will follow-up with her primary care physician restart as needed.    Discharge Instructions   Allergies as of 05/02/2018   No Known Allergies     Medication List    STOP taking these medications   hydrochlorothiazide 25 MG tablet Commonly known as:  HYDRODIURIL     TAKE these medications   acetaminophen 500 MG tablet Commonly known as:  TYLENOL Take 1,000 mg by mouth 2 (two) times daily as needed for moderate pain or headache.   amoxicillin-clavulanate 500-125 MG tablet Commonly known as:  AUGMENTIN Take 1 tablet (500 mg total) by mouth 3 (three) times daily.   aspirin EC 81 MG tablet Take 81 mg by mouth daily.   clobetasol cream 0.05 % Commonly known as:  TEMOVATE Apply 1 application topically 2 (two) times daily.   estradiol 0.1 MG/GM vaginal cream Commonly known as:  ESTRACE Place 1 Applicatorful vaginally 3 (three) times a week.   meclizine 25 MG tablet Commonly known as:  ANTIVERT Take 25 mg by mouth daily as needed for dizziness.   pantoprazole 40 MG tablet Commonly known as:  PROTONIX Take 40 mg by mouth every other day.   rosuvastatin 20 MG tablet Commonly known as:  CRESTOR Take 20 mg by mouth daily.   TOPROL XL 25 MG 24 hr tablet Generic drug:  metoprolol succinate Take 25 mg by mouth daily.       No Known  Allergies  Consultations: None  Procedures/Studies: Ct Chest Wo Contrast  Result Date: 04/30/2018 CLINICAL DATA:  84 year old female with cough and leukocytosis. Abdomen chest radiographs earlier today. EXAM: CT CHEST WITHOUT CONTRAST TECHNIQUE: Multidetector CT imaging of the chest was performed following the standard protocol without IV contrast. COMPARISON:  [facility] chest radiographs reported 1144 hours today. FINDINGS: Cardiovascular: Vascular patency is not evaluated in the absence of IV contrast. Calcified aortic atherosclerosis. Mild for age calcified coronary artery atherosclerosis. No cardiomegaly or pericardial effusion. Mediastinum/Nodes: Very large left side diaphragmatic hernia with herniation of the stomach, splenic flexure, and mesentery into the left chest. The esophageal hiatus is also capacious and contributes to the hernia. Mass effect on the heart which is effaced anteriorly. The thoracic esophagus is nondilated. The herniated stomach demonstrates rotation such that the antrum is located cephalad and the cardia is located caudal - organo-axial rotation. The stomach is moderately distended with food and/or fluid, but does not appear acutely obstructed. The gastric outlet continues into the upper chest, where the proximal duodenum appears within normal limits. No free fluid or extraluminal gas within the herniation sac. No superimposed mediastinal lymphadenopathy. Lungs/Pleura: The major airways are patent. There is mass effect on the left hilum from the large thoracic hernia. There is right lower compressive atelectasis with mild associated consolidation along the hernia sac as seen on series 5, image 54. No left pleural effusion. No pneumothorax. There is mild lower lobe atelectasis or scarring in the right lung. Upper Abdomen: Large left thoracic hernia with contents described above. In the upper abdomen the noncontrast visible liver, gallbladder, spleen, pancreas, adrenal glands,  and kidneys appear unremarkable. The proximal transverse colon remaining in the abdomen appears nondilated. The visible proximal jejunum appear normal. Musculoskeletal: Mild dextroconvex thoracic and levoconvex upper lumbar scoliosis. No acute osseous abnormality identified. IMPRESSION: 1. Large but probably chronic left diaphragmatic hernia containing the stomach and large bowel splenic flexure along with mesentery. There is organo-axial rotation of the stomach, but no evidence of gastric or bowel obstruction. The hernia contents do NOT appear inflamed. 2. Associated mass effect on the left lower lung and mediastinum. Left lower lobe atelectasis. Mild left lower lobe pneumonia is difficult to exclude, but there is no pleural effusion. 3. Negative right lung. No cardiomegaly or pericardial effusion. Calcified coronary artery and Aortic Atherosclerosis (ICD10-I70.0). 4. Negative visible viscera which were remain within the upper abdomen. Electronically Signed   By: Odessa Fleming M.D.   On: 04/30/2018 16:44    (Echo, Carotid, EGD, Colonoscopy, ERCP)    Subjective:   Discharge Exam: Vitals:   05/01/18 2034 05/02/18 0645  BP: 110/65 110/64  Pulse: 98 72  Resp: 19 16  Temp: 99.3 F (37.4 C) 98.2 F (36.8 C)  SpO2: 95% 98%   Vitals:   05/01/18 0544 05/01/18 1640 05/01/18 2034 05/02/18 0645  BP: 110/64 119/60 110/65 110/64  Pulse: 96 92 98 72  Resp:  16 19 16   Temp: 100.3 F (37.9 C) 98.6 F (37 C) 99.3 F (37.4 C) 98.2 F (36.8  C)  TempSrc: Oral Oral  Oral  SpO2: 96% 98% 95% 98%  Weight:      Height:        General: Pt is alert, awake, not in acute distress Cardiovascular: RRR, S1/S2 +, no rubs, no gallops Respiratory: CTA bilaterally, no wheezing, no rhonchi Abdominal: Soft, NT, ND, bowel sounds + Extremities: no edema, no cyanosis    The results of significant diagnostics from this hospitalization (including imaging, microbiology, ancillary and laboratory) are listed below for  reference.     Microbiology: No results found for this or any previous visit (from the past 240 hour(s)).   Labs: BNP (last 3 results) No results for input(s): BNP in the last 8760 hours. Basic Metabolic Panel: Recent Labs  Lab 04/30/18 1439 05/01/18 0549 05/02/18 0819  NA 132* 137 141  K 2.6* 2.8* 4.4  CL 96* 107 109  CO2 23 21* 25  GLUCOSE 148* 109* 98  BUN 32* 27* 24*  CREATININE 2.12* 1.47* 1.15*  CALCIUM 8.6* 7.9* 8.5*  MG 2.2  --   --    Liver Function Tests: No results for input(s): AST, ALT, ALKPHOS, BILITOT, PROT, ALBUMIN in the last 168 hours. No results for input(s): LIPASE, AMYLASE in the last 168 hours. No results for input(s): AMMONIA in the last 168 hours. CBC: Recent Labs  Lab 04/30/18 1439 05/01/18 0549  WBC 16.6* 12.3*  HGB 12.6 11.4*  HCT 36.8 33.6*  MCV 90.2 88.4  PLT 335 312   Cardiac Enzymes: No results for input(s): CKTOTAL, CKMB, CKMBINDEX, TROPONINI in the last 168 hours. BNP: Invalid input(s): POCBNP CBG: No results for input(s): GLUCAP in the last 168 hours. D-Dimer No results for input(s): DDIMER in the last 72 hours. Hgb A1c No results for input(s): HGBA1C in the last 72 hours. Lipid Profile No results for input(s): CHOL, HDL, LDLCALC, TRIG, CHOLHDL, LDLDIRECT in the last 72 hours. Thyroid function studies No results for input(s): TSH, T4TOTAL, T3FREE, THYROIDAB in the last 72 hours.  Invalid input(s): FREET3 Anemia work up No results for input(s): VITAMINB12, FOLATE, FERRITIN, TIBC, IRON, RETICCTPCT in the last 72 hours. Urinalysis No results found for: COLORURINE, APPEARANCEUR, LABSPEC, PHURINE, GLUCOSEU, HGBUR, BILIRUBINUR, KETONESUR, PROTEINUR, UROBILINOGEN, NITRITE, LEUKOCYTESUR Sepsis Labs Invalid input(s): PROCALCITONIN,  WBC,  LACTICIDVEN Microbiology No results found for this or any previous visit (from the past 240 hour(s)).   Time coordinating discharge:37 minutes  SIGNED:   Alwyn RenElizabeth G Alixis Harmon, MD  [facility] 05/02/2018, 11:12 AM  If 7PM-7AM, please contact night-coverage [email or web address] Password [email or web address]

## 2018-05-02 NOTE — Care Management Note (Signed)
Case Management Note  Patient Details  Name: Wendie SimmerGeraldine Swindell MRN: [id number] Date of Birth: January 25, 1932  Subjective/Objective:                  discharge  Action/Plan: Home to self care no needs  Expected Discharge Date:  05/02/18               Expected Discharge Plan:  Home/Self Care  In-House Referral:     Discharge planning Services  CM Consult  Post Acute Care Choice:    Choice offered to:     DME Arranged:    DME Agency:     HH Arranged:    HH Agency:     Status of Service:  Completed, signed off  If discussed at [employer] Length of Stay Meetings, dates discussed:    Additional Comments:  Golda AcreDavis, Rhonda Lynn, RN 05/02/2018, 12:39 PM

## 2018-05-02 NOTE — Evaluation (Signed)
Physical Therapy Evaluation Patient Details Name: Mandy Adams MRN: [id number] DOB: 01-31-1932 Today's Date: 05/02/2018   History of Present Illness  Pt is an 84 y/o female admitted 04/30/2018 with hypokalemia. PMH: CAP, AKI.  Clinical Impression  Patient overall does very well with minimal balance deficits noted. Patient does have a small dog at home that is of concern for fall risk. Patient would benefit from OPPT to decrease risk of falls and improve balance deficits. Patient continues to stand to shower which also increases her risk of falls. Pt would benefit from a tub bench to improve her safety while maintaining her independence. Patient declined a BSC. Patient evaluated by Physical Therapy with no further acute PT needs identified. All education has been completed and the patient has no further questions. See below for any follow-up Physical Therapy or equipment needs. PT is signing off. Thank you for this referral.    Follow Up Recommendations Outpatient PT;Supervision - Intermittent    Equipment Recommendations  Other (comment)(tub bench)    Recommendations for Other Services       Precautions / Restrictions Restrictions Weight Bearing Restrictions: No      Mobility  Bed Mobility Overal bed mobility: Independent                Transfers Overall transfer level: Independent                  Ambulation/Gait Ambulation/Gait assistance: Supervision Gait Distance (Feet): 150 Feet Assistive device: None Gait Pattern/deviations: Step-through pattern;Decreased stride length Gait velocity: decr   General Gait Details: no overt LOB but patient tended to stop to converse with PT instead of walking and talking  Stairs            Wheelchair Mobility    Modified Rankin (Stroke Patients Only)       Balance Overall balance assessment: Mild deficits observed, not formally tested                                           Pertinent  Vitals/Pain Pain Assessment: No/denies pain    Home Living Family/patient expects to be discharged to:: Private residence Living Arrangements: Alone Available Help at Discharge: Family Type of Home: House Home Access: Stairs to enter     Home Layout: Multi-level Home Equipment: None      Prior Function Level of Independence: Independent         Comments: drove to church, grocery store, small area around her neighborhood     Hand Dominance        Extremity/Trunk Assessment   Upper Extremity Assessment Upper Extremity Assessment: Generalized weakness    Lower Extremity Assessment Lower Extremity Assessment: Generalized weakness    Cervical / Trunk Assessment Cervical / Trunk Assessment: Kyphotic  Communication   Communication: No difficulties  Cognition Arousal/Alertness: Awake/alert Behavior During Therapy: WFL for tasks assessed/performed Overall Cognitive Status: Within Functional Limits for tasks assessed                                        General Comments General comments (skin integrity, edema, etc.): patient was able to stand with feet together balance with 5 horizontal head turns with mild LOB, stepping out of position to regain balance    Exercises     Assessment/Plan  PT Assessment All further PT needs can be met in the next venue of care  PT Problem List Decreased strength;Decreased balance;Decreased mobility       PT Treatment Interventions      PT Goals (Current goals can be found in the Care Plan section)  Acute Rehab PT Goals Patient Stated Goal: go home today PT Goal Formulation: With patient/family Time For Goal Achievement: 05/09/18 Potential to Achieve Goals: Good    Frequency     Barriers to discharge        Co-evaluation               AM-PAC PT "6 Clicks" Daily Activity  Outcome Measure Difficulty turning over in bed (including adjusting bedclothes, sheets and blankets)?: None Difficulty  moving from lying on back to sitting on the side of the bed? : None Difficulty sitting down on and standing up from a chair with arms (e.g., wheelchair, bedside commode, etc,.)?: None Help needed moving to and from a bed to chair (including a wheelchair)?: None Help needed walking in hospital room?: A Little Help needed climbing 3-5 steps with a railing? : A Little 6 Click Score: 22    End of Session Equipment Utilized During Treatment: Gait belt Activity Tolerance: Patient tolerated treatment well Patient left: in bed;with family/visitor present;with call bell/phone within reach Nurse Communication: Mobility status PT Visit Diagnosis: Unsteadiness on feet (R26.81);Muscle weakness (generalized) (M62.81)    Time: 1030-1058 PT Time Calculation (min) (ACUTE ONLY): 28 min   Charges:   PT Evaluation $PT Eval Low Complexity: 1 Low PT Treatments $Gait Training: 8-22 mins   PT G Codes:        Jeanluc Wegman D. Hartnett-Rands, MS, PT Per [place] [phone] 05/02/2018, 11:05 AM

## 2018-12-28 IMAGING — CT CT CHEST W/O CM
2 of 3 series · 15 of 36 positions shown, 18 images · non-contrast
Comparison: [facility] chest radiographs reported 9977 hours
today.

CLINICAL DATA: 86-year-old female with cough and leukocytosis.
Abdomen chest radiographs earlier today.

EXAM:
CT CHEST WITHOUT CONTRAST
TECHNIQUE: Multidetector CT imaging of the chest was performed following the
standard protocol without IV contrast.

[Series 2: thorax · axial · 0.65mm/px · z∈[+1418,+1684]mm · 12 of 157 slices shown, 15 images]
[im 12/157  mediastinal]
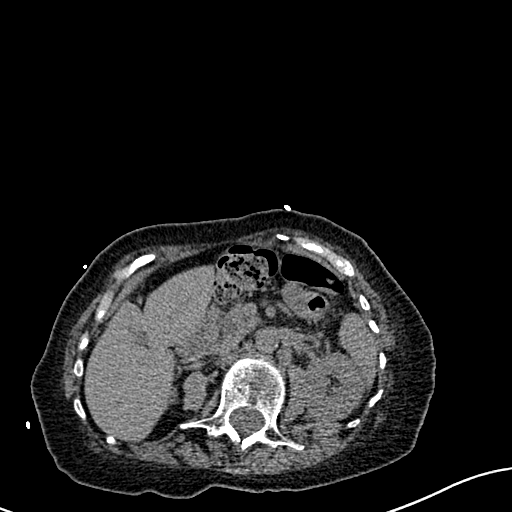
[im 12/157  lung]
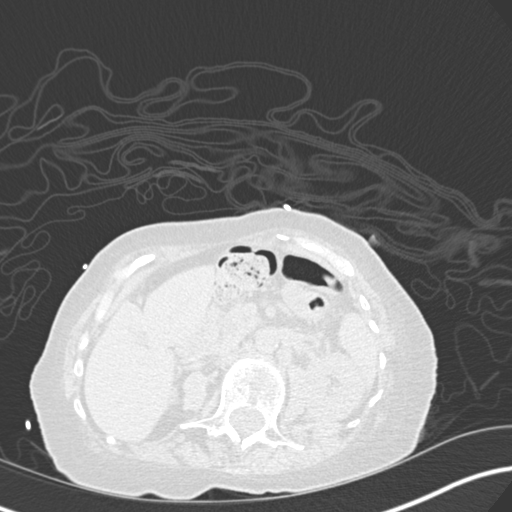
[im 24/157  lung]
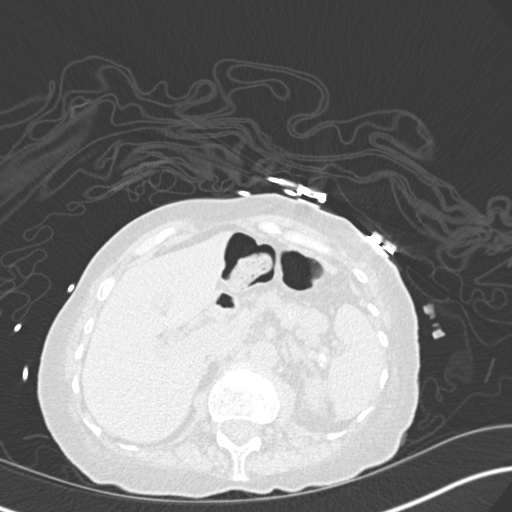
[im 35/157  lung]
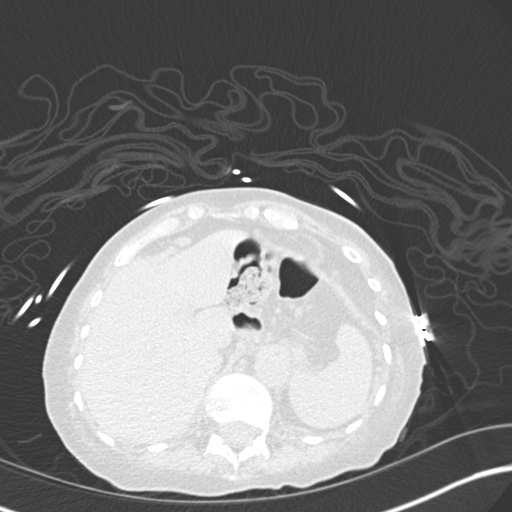
[im 47/157  lung]
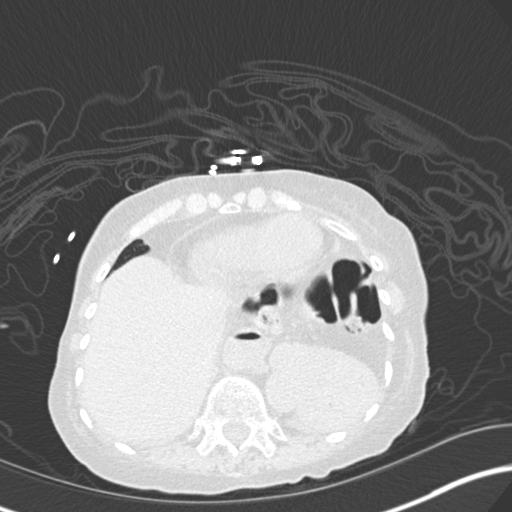
[im 58/157  mediastinal]
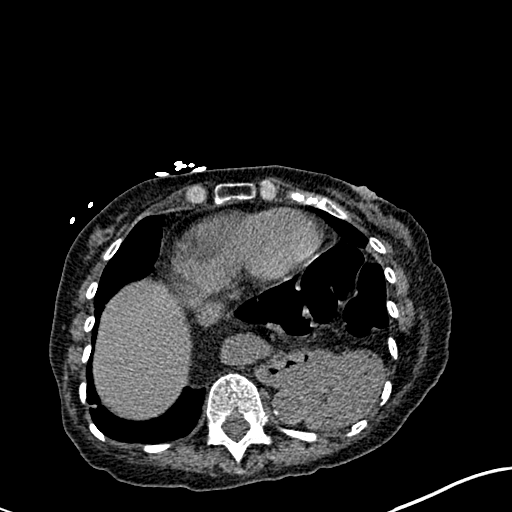
[im 58/157  lung]
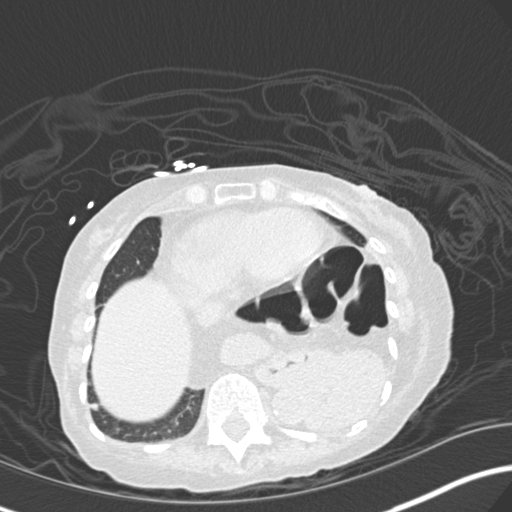
[im 70/157  lung]
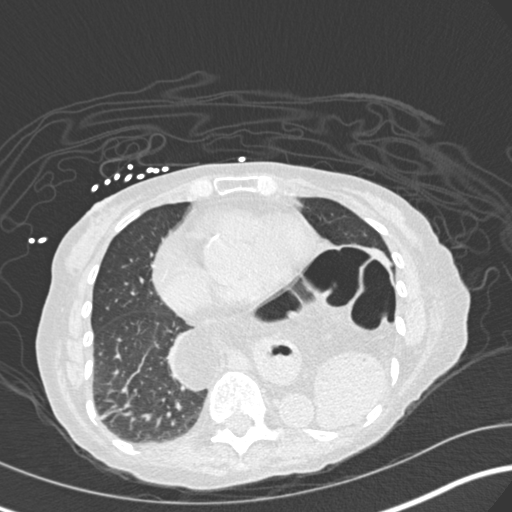
[im 87/157  lung]
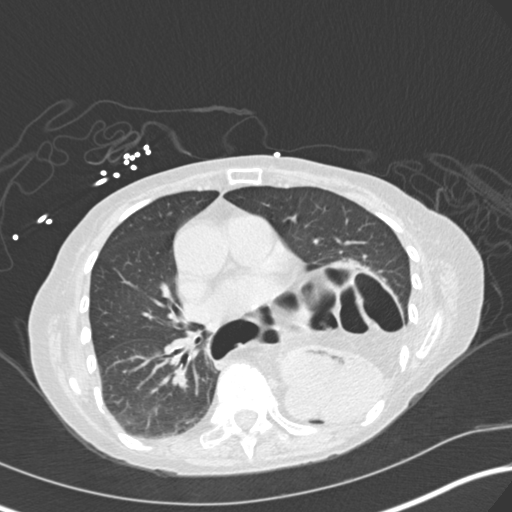
[im 99/157  lung]
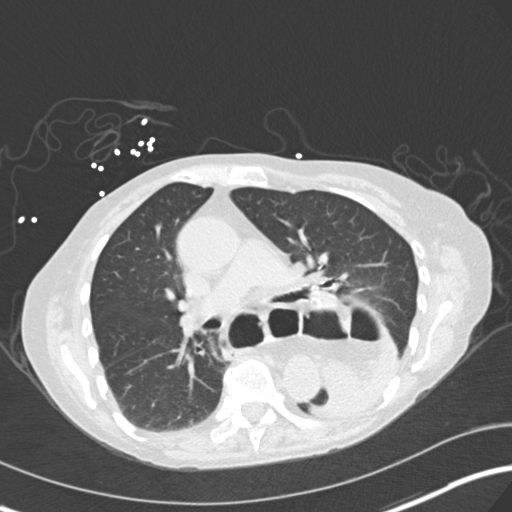
[im 110/157  mediastinal]
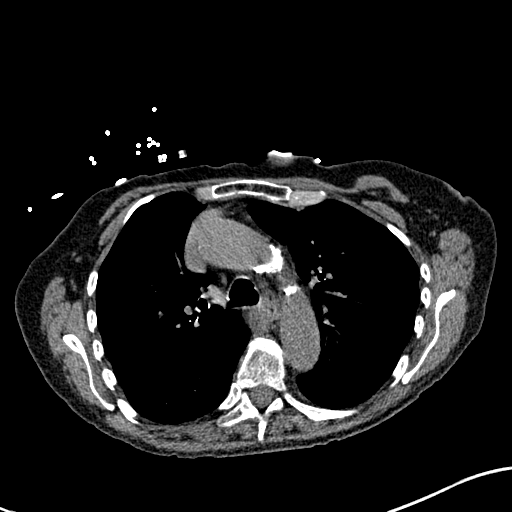
[im 110/157  lung]
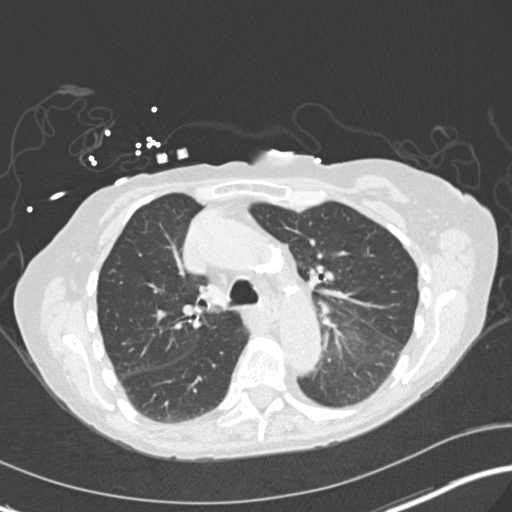
[im 122/157  lung]
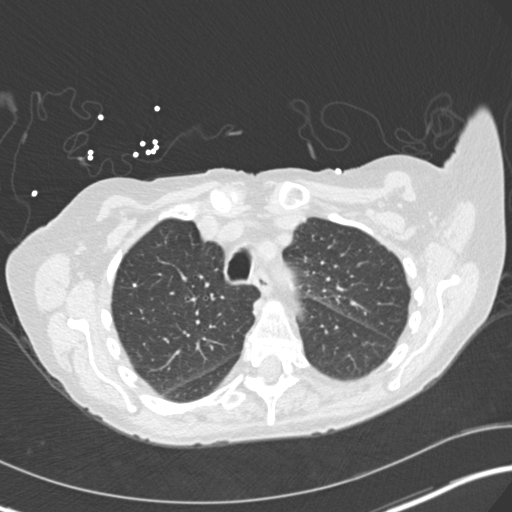
[im 133/157  lung]
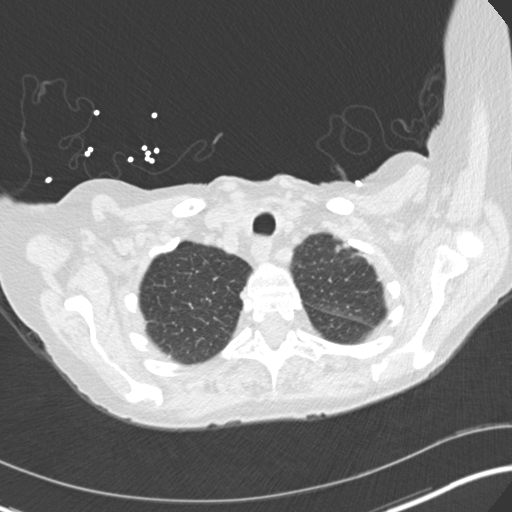
[im 145/157  lung]
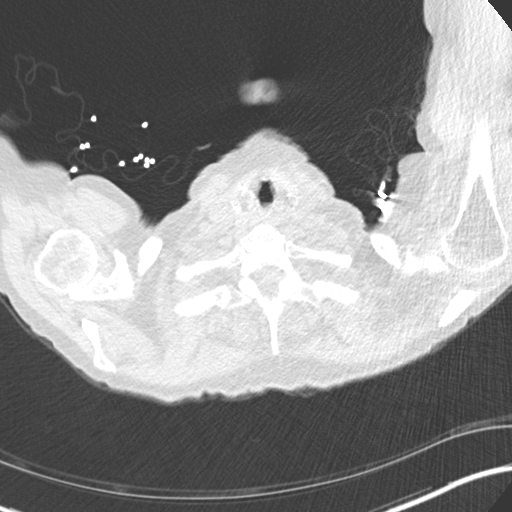

[Series 6: coronal · coronal · 0.56mm/px · 3 of 127 slices shown]
[im 26/127  lung]
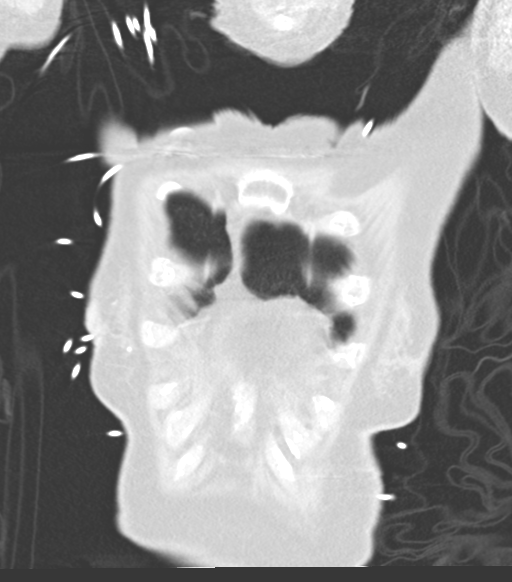
[im 51/127  lung]
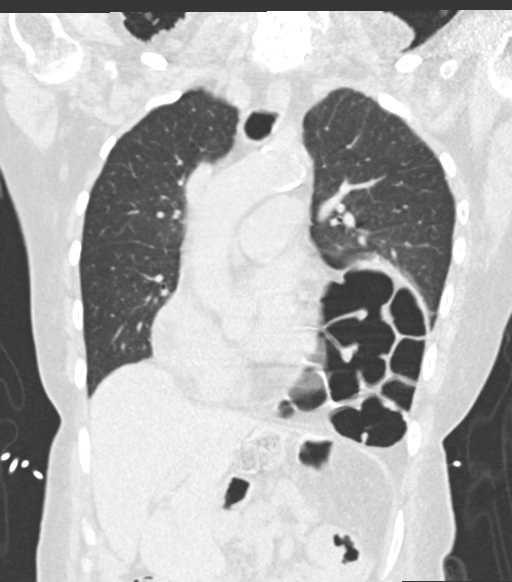
[im 76/127  lung]
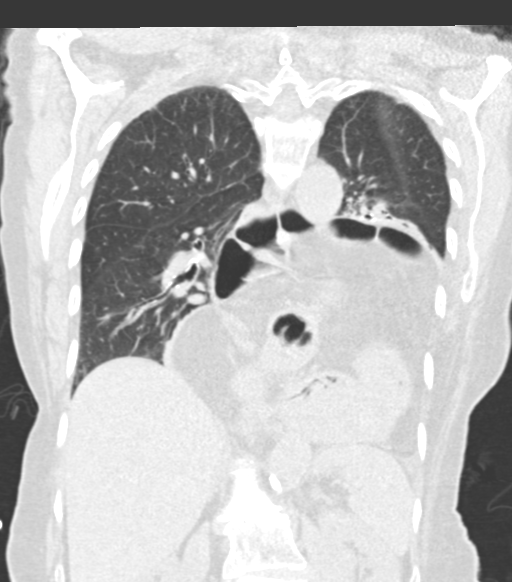

[15 of 36 positions shown; findings below may reference images not displayed]

FINDINGS: Cardiovascular: Vascular patency is not evaluated in the absence of
IV contrast. Calcified aortic atherosclerosis. Mild for age
calcified coronary artery atherosclerosis. No cardiomegaly or
pericardial effusion.

Mediastinum/Nodes: Very large left side diaphragmatic hernia with
herniation of the stomach, splenic flexure, and mesentery into the
left chest. The esophageal hiatus is also capacious and contributes
to the hernia.

Mass effect on the heart which is effaced anteriorly.

The thoracic esophagus is nondilated. The herniated stomach
demonstrates rotation such that the antrum is located cephalad and
the cardia is located caudal - organo-axial rotation. The stomach is
moderately distended with food and/or fluid, but does not appear
acutely obstructed. The gastric outlet continues into the upper
chest, where the proximal duodenum appears within normal limits.

No free fluid or extraluminal gas within the herniation sac.

No superimposed mediastinal lymphadenopathy.

Lungs/Pleura: The major airways are patent. There is mass effect on
the left hilum from the large thoracic hernia. There is right lower
compressive atelectasis with mild associated consolidation along the
hernia sac as seen on series 5, image 54. No left pleural effusion.
No pneumothorax. There is mild lower lobe atelectasis or scarring in
the right lung.

Upper Abdomen: Large left thoracic hernia with contents described
above. In the upper abdomen the noncontrast visible liver,
gallbladder, spleen, pancreas, adrenal glands, and kidneys appear
unremarkable. The proximal transverse colon remaining in the abdomen
appears nondilated. The visible proximal jejunum appear normal.

Musculoskeletal: Mild dextroconvex thoracic and levoconvex upper
lumbar scoliosis. No acute osseous abnormality identified.
IMPRESSION: 1. Large but probably chronic left diaphragmatic hernia containing
the stomach and large bowel splenic flexure along with mesentery.
There is organo-axial rotation of the stomach, but no evidence of
gastric or bowel obstruction. The hernia contents do NOT appear
inflamed.
2. Associated mass effect on the left lower lung and mediastinum.
Left lower lobe atelectasis. Mild left lower lobe pneumonia is
difficult to exclude, but there is no pleural effusion.
3. Negative right lung. No cardiomegaly or pericardial effusion.
Calcified coronary artery and Aortic Atherosclerosis ([id number]-SR0.0).
4. Negative visible viscera which were remain within the upper
abdomen.

## 2019-12-11 ENCOUNTER — Ambulatory Visit: Payer: Medicare Other

## 2019-12-22 ENCOUNTER — Ambulatory Visit: Payer: Medicare Other

## 2019-12-25 ENCOUNTER — Ambulatory Visit: Payer: Medicare Other | Attending: Internal Medicine

## 2019-12-25 DIAGNOSIS — Z23 Encounter for immunization: Secondary | ICD-10-CM | POA: Insufficient documentation

## 2019-12-25 NOTE — Progress Notes (Signed)
   Covid-19 Vaccination Clinic  Name:  Mandy Adams    MRN: [id number] DOB: 1932-10-08  12/25/2019  [name] was observed post Covid-19 immunization for 15 minutes without incidence. She was provided with Vaccine Information Sheet and instruction to access the V-Safe system.   [name] was instructed to call 911 with any severe reactions post vaccine: Marland Kitchen Difficulty breathing  . Swelling of your face and throat  . A fast heartbeat  . A bad rash all over your body  . Dizziness and weakness    Immunizations Administered    Name Date Dose VIS Date Route   Pfizer COVID-19 Vaccine 12/25/2019  4:35 PM 0.3 mL 10/23/2019 Intramuscular   Manufacturer: [employer], [employer]   Lot: [id number]   NDC: [address]-9

## 2020-01-17 ENCOUNTER — Ambulatory Visit: Payer: Medicare Other | Attending: Internal Medicine

## 2020-01-17 DIAGNOSIS — Z23 Encounter for immunization: Secondary | ICD-10-CM | POA: Insufficient documentation

## 2020-01-17 NOTE — Progress Notes (Signed)
   Covid-19 Vaccination Clinic  Name:  Mandy Adams    MRN: [id number] DOB: 12/23/1931  01/17/2020  [name] was observed post Covid-19 immunization for 15 minutes without incident. She was provided with Vaccine Information Sheet and instruction to access the V-Safe system.   [name] was instructed to call 911 with any severe reactions post vaccine: Marland Kitchen Difficulty breathing  . Swelling of face and throat  . A fast heartbeat  . A bad rash all over body  . Dizziness and weakness   Immunizations Administered    Name Date Dose VIS Date Route   Pfizer COVID-19 Vaccine 01/17/2020  3:08 PM 0.3 mL 10/23/2019 Intramuscular   Manufacturer: [employer], [employer]   Lot: [id number]   NDC: [address]-2

## 2020-05-30 ENCOUNTER — Other Ambulatory Visit: Payer: Self-pay

## 2020-05-30 ENCOUNTER — Emergency Department (HOSPITAL_COMMUNITY): Payer: Medicare Other

## 2020-05-30 ENCOUNTER — Encounter (HOSPITAL_COMMUNITY): Payer: Self-pay

## 2020-05-30 ENCOUNTER — Emergency Department (HOSPITAL_COMMUNITY)
Admission: EM | Admit: 2020-05-30 | Discharge: 2020-05-30 | Disposition: A | Discharge status: Home / Self Care | Payer: Medicare Other | Attending: Emergency Medicine | Admitting: Emergency Medicine

## 2020-05-30 DIAGNOSIS — R2243 Localized swelling, mass and lump, lower limb, bilateral: Secondary | ICD-10-CM | POA: Diagnosis not present

## 2020-05-30 DIAGNOSIS — R0602 Shortness of breath: Secondary | ICD-10-CM | POA: Insufficient documentation

## 2020-05-30 DIAGNOSIS — R42 Dizziness and giddiness: Secondary | ICD-10-CM | POA: Insufficient documentation

## 2020-05-30 DIAGNOSIS — I951 Orthostatic hypotension: Secondary | ICD-10-CM | POA: Diagnosis not present

## 2020-05-30 DIAGNOSIS — Z5321 Procedure and treatment not carried out due to patient leaving prior to being seen by health care provider: Secondary | ICD-10-CM | POA: Diagnosis not present

## 2020-05-30 LAB — CBC WITH DIFFERENTIAL/PLATELET
Abs Immature Granulocytes: 0.03 10*3/uL (ref 0.00–0.07)
Basophils Absolute: 0.1 10*3/uL (ref 0.0–0.1)
Basophils Relative: 1 %
Eosinophils Absolute: 0.2 10*3/uL (ref 0.0–0.5)
Eosinophils Relative: 2 %
HCT: 42.6 % (ref 36.0–46.0)
Hemoglobin: 13.9 g/dL (ref 12.0–15.0)
Immature Granulocytes: 0 %
Lymphocytes Relative: 30 %
Lymphs Abs: 2.7 10*3/uL (ref 0.7–4.0)
MCH: 29.8 pg (ref 26.0–34.0)
MCHC: 32.6 g/dL (ref 30.0–36.0)
MCV: 91.4 fL (ref 80.0–100.0)
Monocytes Absolute: 0.7 10*3/uL (ref 0.1–1.0)
Monocytes Relative: 8 %
Neutro Abs: 5.4 10*3/uL (ref 1.7–7.7)
Neutrophils Relative %: 59 %
Platelets: 376 10*3/uL (ref 150–400)
RBC: 4.66 MIL/uL (ref 3.87–5.11)
RDW: 13.6 % (ref 11.5–15.5)
WBC: 9.1 10*3/uL (ref 4.0–10.5)
nRBC: 0 % (ref 0.0–0.2)

## 2020-05-30 LAB — COMPREHENSIVE METABOLIC PANEL
ALT: 13 U/L (ref 0–44)
AST: 18 U/L (ref 15–41)
Albumin: 4.1 g/dL (ref 3.5–5.0)
Alkaline Phosphatase: 69 U/L (ref 38–126)
Anion gap: 9 (ref 5–15)
BUN: 17 mg/dL (ref 8–23)
CO2: 25 mmol/L (ref 22–32)
Calcium: 9.2 mg/dL (ref 8.9–10.3)
Chloride: 103 mmol/L (ref 98–111)
Creatinine, Ser: 1.23 mg/dL — ABNORMAL HIGH (ref 0.44–1.00)
GFR calc Af Amer: 46 mL/min — ABNORMAL LOW (ref >60)
GFR calc non Af Amer: 40 mL/min — ABNORMAL LOW (ref >60)
Glucose, Bld: 126 mg/dL — ABNORMAL HIGH (ref 70–99)
Potassium: 3.7 mmol/L (ref 3.5–5.1)
Sodium: 137 mmol/L (ref 135–145)
Total Bilirubin: 0.6 mg/dL (ref 0.3–1.2)
Total Protein: 7.4 g/dL (ref 6.5–8.1)

## 2020-05-30 NOTE — ED Notes (Signed)
Pt allowing nurse to triage her now.

## 2020-05-30 NOTE — ED Triage Notes (Signed)
Pt seen earlier in triage and left afterward because she didn't know she was waiting for a doctor. Comes back tonight to be seen for dizziness. Denies shob from earlier.

## 2020-05-30 NOTE — ED Triage Notes (Addendum)
Patient c/o SOB and dizziness x 3 months. Patient has swelling to her bilateral feet and ankles.  Patient went to [facility] in clinic  Also stated that the patient had orthostatic hypotension

## 2020-05-30 NOTE — ED Notes (Signed)
Called [facility] for room placement. Eloped from waiting area.  

## 2020-05-30 NOTE — ED Notes (Signed)
Pt refusing to be triaged.  

## 2020-05-31 ENCOUNTER — Emergency Department (HOSPITAL_COMMUNITY)
Admission: EM | Admit: 2020-05-31 | Discharge: 2020-05-31 | Discharge status: Against Medical Advice | Payer: Medicare Other | Source: Home / Self Care

## 2020-05-31 LAB — URINALYSIS, ROUTINE W REFLEX MICROSCOPIC
Bilirubin Urine: NEGATIVE
Glucose, UA: NEGATIVE mg/dL
Hgb urine dipstick: NEGATIVE
Ketones, ur: NEGATIVE mg/dL
Nitrite: NEGATIVE
Protein, ur: NEGATIVE mg/dL
Specific Gravity, Urine: 1.015 (ref 1.005–1.030)
pH: 5 (ref 5.0–8.0)

## 2020-05-31 NOTE — ED Notes (Addendum)
Explained/advised pt of extended wait times, thanked pt for patience and ensured will be seen as soon as possible. Pt confirmed/acknowledged understanding. ENMiles 

## 2020-05-31 NOTE — ED Notes (Signed)
Pts son Harvie Heck and wife come to ED looking for pt. Pt had not answered after several calls. Pt was reported by son to police as missing since no one could give update. After a staff member remembered seeing her in lobby doorway, security tape was reviewed. Pt left in [employer] #30 at 0529. Updated son who verified pt was safe at her home.

## 2020-07-20 ENCOUNTER — Other Ambulatory Visit: Payer: Self-pay

## 2020-07-20 ENCOUNTER — Ambulatory Visit: Payer: Medicare Other | Admitting: Neurology

## 2020-07-20 ENCOUNTER — Encounter: Payer: Self-pay | Admitting: Neurology

## 2020-07-20 ENCOUNTER — Telehealth: Payer: Self-pay | Admitting: Neurology

## 2020-07-20 ENCOUNTER — Ambulatory Visit (INDEPENDENT_AMBULATORY_CARE_PROVIDER_SITE_OTHER): Payer: Medicare Other | Admitting: Neurology

## 2020-07-20 VITALS — BP 114/87

## 2020-07-20 DIAGNOSIS — G301 Alzheimer's disease with late onset: Secondary | ICD-10-CM

## 2020-07-20 DIAGNOSIS — R413 Other amnesia: Secondary | ICD-10-CM

## 2020-07-20 DIAGNOSIS — G309 Alzheimer's disease, unspecified: Secondary | ICD-10-CM | POA: Diagnosis not present

## 2020-07-20 DIAGNOSIS — F0280 Dementia in other diseases classified elsewhere without behavioral disturbance: Secondary | ICD-10-CM | POA: Diagnosis not present

## 2020-07-20 MED ORDER — MEMANTINE HCL 28 X 5 MG & 21 X 10 MG PO TABS
ORAL_TABLET | ORAL | 12 refills | Status: DC
Start: 2020-07-20 — End: 2020-08-16

## 2020-07-20 NOTE — Patient Instructions (Signed)
I had a long discussion with the patient and her daughter regarding her subacute memory and cognitive decline which likely represents late onset Alzheimer's dementia.  Recommend further evaluation for treatable causes by checking memory panel labs, EEG and MRI scan of the brain.  Trial of Namenda starter pack increase as tolerated.  She was advised to use a cane at all times and to do memory compensation strategies.  She will need increasing help at home and if unable to do so may need moving into assisted living.  She will return for follow-up in the future in 2 months or call earlier if necessary. Memory Compensation Strategies  1. Use "WARM" strategy.  W= write it down  A= associate it  R= repeat it  M= make a mental note  2.   You can keep a Glass blower/designer.  Use a 3-ring notebook with sections for the following: calendar, important names and phone numbers,  medications, doctors' names/phone numbers, lists/reminders, and a section to journal what you did  each day.   3.    Use a calendar to write appointments down.  4.    Write yourself a schedule for the day.  This can be placed on the calendar or in a separate section of the Memory Notebook.  Keeping a  regular schedule can help memory.  5.    Use medication organizer with sections for each day or morning/evening pills.  You may need help loading it  6.    Keep a basket, or pegboard by the door.  Place items that you need to take out with you in the basket or on the pegboard.  You may also want to  include a message board for reminders.  7.    Use sticky notes.  Place sticky notes with reminders in a place where the task is performed.  For example: " turn off the  stove" placed by the stove, "lock the door" placed on the door at eye level, " take your medications" on  the bathroom mirror or by the place where you normally take your medications.  8.    Use alarms/timers.  Use while cooking to remind yourself to check on food or as a  reminder to take your medicine, or as a  reminder to make a call, or as a reminder to perform another task, etc.  Alzheimer Disease Caregiver Guide  Alzheimer disease causes a person to lose the ability to remember things and make decisions. A person who has Alzheimer disease may not be able to take care of himself or herself. He or she may need help with simple tasks. The tips below can help you care for the person. What kind of changes does this condition cause? This condition makes a person:  Forget things.  Feel confused.  Act differently.  Have different moods. These things get worse with time. Tips to help with symptoms  Be calm and patient.  Respond with a simple, short answer.  Avoid correcting the person in a negative way.  Try not to take things personally, even if the person forgets your name.  Do not argue with the person. This may make the person more upset. Tips to lessen frustration  Make appointments and do daily tasks when the person is at his or her best.  Take your time. Simple tasks may take longer. Allow plenty of time to complete tasks.  Limit choices for the person.  Involve the person in what you are doing.  Keep  a daily routine.  Avoid new or crowded places, if possible.  Use simple words, short sentences, and a calm voice. Only give one direction at a time.  Buy clothes and shoes that are easy to put on and take off.  Organize medicines in a pillbox for each day of the week.  Keep a calendar in a central location to remind the person of meetings or other activities.  Let people help if they offer. Take a break when needed. Tips to prevent injury  Keep floors clear. Remove rugs, magazine racks, and floor lamps.  Keep hallways well-lit.  Put a handrail and non-slip mat in the bathtub or shower.  Put childproof locks on cabinets that have dangerous items in them. These items include medicine, alcohol, guns, toxic cleaning items, sharp  tools, matches, and lighters.  Put locks on doors where the person cannot see or reach them. This helps the person to not wander out of the house and get lost.  Be prepared for emergencies. Keep a list of emergency phone numbers and addresses close by.  Bracelets may be worn that track location and identify the person as having memory problems. This should be worn at all times for safety. Tips for the future  Discuss financial and legal planning early. People with this disease have trouble managing their money as the disease gets worse. Get help from a professional.  Talk about advance directives, safety, and daily care. Take these steps: ? Create a living will and choose a power of attorney. This is someone who can make decisions for the person with Alzheimer disease when he or she can no longer do so. ? Discuss driving safety and when to stop driving. The person's doctor can help with this. ? If the person lives alone, make sure he or she is safe. Some people need extra help at home. Other people need more care at a nursing home or care center. Where to find support You can find support by joining a support group near you. Some benefits of joining a support group include:  Learning ways to manage stress.  Sharing experiences with others.  Getting emotional comfort and support.  Learning about caregiving as the disease progresses.  Knowing what community resources are available and making use of them. Where to find more information  Alzheimer's Association: [employer] Contact a doctor if:  The person has a fever.  The person has a sudden behavior change that does not get better with calming strategies.  The person is not able to take care of himself or herself at home.  The person threatens you or anyone else, including himself or herself.  You are no longer able to care for the person. Summary  Alzheimer disease causes a person to forget things and to be confused.  A  person who has this condition may not be able to take care of himself or herself.  Take steps to keep the person from getting hurt. Plan for future care.  You can find support by joining a support group near you. This information is not intended to replace advice given to you by your health care provider. Make sure you discuss any questions you have with your health care provider. Document Revised: 02/17/2019 Document Reviewed: 10/24/2017 Elsevier Patient Education  2020 [employer].

## 2020-07-20 NOTE — Progress Notes (Signed)
[facility] [address] [place]. [place] [address] [phone]       OFFICE CONSULT NOTE  [name] Date of Birth:  1932/03/26 Medical Record Number:  [id number]   Referring MD: Elon Alas Reason for Referral: Memory loss HPI: [name] is a pleasant 86 year old Caucasian lady seen today for initial office consultation visit for memory loss.  She is accompanied by her daughter.  History is obtained from them, review of referral notes and electronic medical records.  No imaging films available for review today.  She has a past medical history of hyperlipidemia, hypertension, eczema, back pain, dizziness, esophagitis who has noticed cognitive decline with memory loss, confusion and disorientation for the last several months.  This was brought on by recent urine tract infection in July 2021 which has been treated but her deficits have persisted.  Patient has apparently not had any previous cognitive eval.  She is currently living at home with family has brought in a caregiver to help 4 to 5 hours a day.  Patient has not been driving for years and her daughter does have finances.  She can ambulate independently but recently has started using a cane.  Her balance has not been as good but she has had no falls or injuries.  The daughter denies any delusions or hallucinations, unsafe behavior.  Patient does get at times disoriented and confused but can be redirected easily.  She has not tried any medications like Aricept or any other over-the-counter memory pills.  She has no prior history of strokes, TIAs, migraines, seizures, significant head injury with loss of consciousness.  There is no family history of dementia.  The family was concerned because she had not been eating well and in fact they found an ulcer at her bottom indicating that she had not been ambulating and moving around as much as she should have been.  ROS:   14 system review of systems is positive for  memory loss, confusion, disorientation, lack of organization, impaired thought processing and all other systems negative  PMH:  Past Medical History:  Diagnosis Date  . AKI (acute kidney injury) (HCC) 04/30/2018  . Community acquired pneumonia 04/30/2018  . Leukocytosis 04/30/2018    Social History:  Social History   Socioeconomic History  . Marital status: Married    Spouse name: Not on file  . Number of children: Not on file  . Years of education: Not on file  . Highest education level: Not on file  Occupational History  . Not on file  Tobacco Use  . Smoking status: Never Smoker  . Smokeless tobacco: Never Used  Vaping Use  . Vaping Use: Never used  Substance and Sexual Activity  . Alcohol use: Never  . Drug use: Never  . Sexual activity: Not on file  Other Topics Concern  . Not on file  Social History Narrative  . Not on file   Social Determinants of Health   Financial Resource Strain:   . Difficulty of Paying Living Expenses: Not on file  Food Insecurity:   . Worried About Programme researcher, broadcasting/film/video in the Last Year: Not on file  . Ran Out of Food in the Last Year: Not on file  Transportation Needs:   . Lack of Transportation (Medical): Not on file  . Lack of Transportation (Non-Medical): Not on file  Physical Activity:   . Days of Exercise per Week: Not on file  . Minutes of Exercise per Session: Not on file  Stress:   . Feeling of Stress : Not on file  Social Connections:   . Frequency of Communication with Friends and Family: Not on file  . Frequency of Social Gatherings with Friends and Family: Not on file  . Attends Religious Services: Not on file  . Active Member of Clubs or Organizations: Not on file  . Attends Banker Meetings: Not on file  . Marital Status: Not on file  Intimate Partner Violence:   . Fear of Current or Ex-Partner: Not on file  . Emotionally Abused: Not on file  . Physically Abused: Not on file  . Sexually Abused: Not on  file    Medications:   Current Outpatient Medications on File Prior to Visit  Medication Sig Dispense Refill  . diclofenac Sodium (VOLTAREN) 1 % GEL Apply 2 g topically 4 (four) times daily as needed (pain).    Marland Kitchen estradiol (ESTRACE) 0.1 MG/GM vaginal cream Place 1 Applicatorful vaginally 3 (three) times a week.    . meclizine (ANTIVERT) 25 MG tablet Take 25 mg by mouth daily as needed for dizziness.    . pantoprazole (PROTONIX) 40 MG tablet Take 40 mg by mouth every other day.    . simvastatin (ZOCOR) 40 MG tablet Take 40 mg by mouth daily at 6 PM.    . TOPROL XL 25 MG 24 hr tablet Take 25 mg by mouth daily.     No current facility-administered medications on file prior to visit.    Allergies:  No Known Allergies  Physical Exam General: Frail looking petite elderly Caucasian lady seated, in no evident distress Head: head normocephalic and atraumatic.   Neck: supple with no carotid or supraclavicular bruits Cardiovascular: regular rate and rhythm, no murmurs Musculoskeletal: Severe kyphoscoliosis. Skin:  no rash/petichiae Vascular:  Normal pulses all extremities  Neurologic Exam Mental Status: Awake and fully alert. Oriented to place and time. Recent and remote memory poor attention span, concentration and fund of knowledge diminished. Mood and affect appropriate.  Mini-Mental status exam she scored 19/30 with deficits in orientation, attention, calculation, recall.  She was unable to copy intersecting pentagons.  Clock drawing score was 2/4.  She was able to name only 7 animals which can walk on 4 legs. Cranial Nerves: Fundoscopic exam reveals sharp disc margins. Pupils equal, briskly reactive to light. Extraocular movements full without nystagmus. Visual fields full to confrontation. Hearing mildly diminished bilaterally facial sensation intact. Face, tongue, palate moves normally and symmetrically.  Motor: Normal bulk and tone. Normal strength in all tested extremity  muscles. Sensory.: intact to touch , pinprick , position and vibratory sensation.  Coordination: Rapid alternating movements normal in all extremities. Finger-to-nose and heel-to-shin performed accurately bilaterally. Gait and Station: Arises from chair without difficulty. Stance is slightly stooped. Gait demonstrates normal stride length and balance uses a cane to walk.  Tandem walking not tested. Reflexes: 1+ and symmetric. Toes downgoing.       ASSESSMENT: 86 year old Caucasian lady with subacute memory and cognitive decline likely due to Alzheimer's dementia.  Evaluation for reversible causes has not yet been done     PLAN: I had a long discussion with the patient and her daughter regarding her subacute memory and cognitive decline which likely represents late onset Alzheimer's dementia.  Recommend further evaluation for treatable causes by checking memory panel labs, EEG and MRI scan of the brain.  Trial of Namenda starter pack increase as tolerated.  She was advised to use a cane at all times and  to do memory compensation strategies.  She will need increasing help at home and if unable to do so may need moving into assisted living.  Greater than 50% time during this 45-minute consultation visit was spent in counseling and coordination of care about memory loss and dementia and answering questions she will return for follow-up in the future in 2 months or call earlier if necessary. Delia Heady, MD Note: This document was prepared with digital dictation and possible smart phrase technology. Any transcriptional errors that result from this process are unintentional.

## 2020-07-20 NOTE — Telephone Encounter (Signed)
[employer] medicare order sent to [facility]. No auth they will reach out to the patient to schedule.  

## 2020-07-21 ENCOUNTER — Telehealth: Payer: Self-pay

## 2020-07-21 LAB — DEMENTIA PANEL
Homocysteine: 22.2 umol/L — ABNORMAL HIGH (ref 0.0–21.3)
RPR Ser Ql: NONREACTIVE
TSH: 2.57 u[IU]/mL (ref 0.450–4.500)
Vitamin B-12: 330 pg/mL (ref 232–1245)

## 2020-07-21 NOTE — Progress Notes (Signed)
Kindly inform the patient that lab work for reversible causes of memory loss was satisfactory

## 2020-07-21 NOTE — Telephone Encounter (Signed)
Pt verified by name and DOB, results given per provider, voiced understanding all question answered. 

## 2020-07-21 NOTE — Telephone Encounter (Signed)
-----   Message from Micki Riley, MD sent at 07/21/2020  4:36 PM EDT ----- Joneen Roach inform the patient that lab work for reversible causes of memory loss was satisfactory

## 2020-07-25 ENCOUNTER — Ambulatory Visit: Payer: Medicare Other | Admitting: Neurology

## 2020-08-01 ENCOUNTER — Ambulatory Visit (INDEPENDENT_AMBULATORY_CARE_PROVIDER_SITE_OTHER): Payer: Medicare Other | Admitting: Neurology

## 2020-08-01 DIAGNOSIS — R410 Disorientation, unspecified: Secondary | ICD-10-CM | POA: Diagnosis not present

## 2020-08-01 DIAGNOSIS — F0280 Dementia in other diseases classified elsewhere without behavioral disturbance: Secondary | ICD-10-CM

## 2020-08-02 ENCOUNTER — Other Ambulatory Visit: Payer: Self-pay

## 2020-08-02 ENCOUNTER — Ambulatory Visit
Admission: RE | Admit: 2020-08-02 | Discharge: 2020-08-02 | Disposition: A | Discharge status: Home / Self Care | Payer: Medicare Other | Source: Ambulatory Visit | Attending: Neurology | Admitting: Neurology

## 2020-08-02 DIAGNOSIS — R413 Other amnesia: Secondary | ICD-10-CM | POA: Diagnosis not present

## 2020-08-02 MED ORDER — GADOBUTROL 1 MMOL/ML IV SOLN
13.0000 mL | Freq: Once | INTRAVENOUS | Status: AC | PRN
  Administered 2020-08-02: 13 mL via INTRAVENOUS

## 2020-08-02 NOTE — Progress Notes (Signed)
Kindly inform the patient that brainwave study shows mild slowing of brain activity which may be seen in patients due to old age of memory loss and is not a worrisome finding.  No definite seizure activity noted.

## 2020-08-03 ENCOUNTER — Encounter: Payer: Self-pay | Admitting: *Deleted

## 2020-08-04 ENCOUNTER — Encounter: Payer: Self-pay | Admitting: *Deleted

## 2020-08-04 NOTE — Progress Notes (Signed)
Kindly inform the patient that MRI scan of the brain showed moderate degree of shrinkage of the brain which is commensurate with her history of memory loss.  No big surprise and nothing to worry about

## 2020-08-12 ENCOUNTER — Other Ambulatory Visit: Payer: Self-pay | Admitting: Neurology

## 2020-08-15 ENCOUNTER — Other Ambulatory Visit: Payer: Self-pay | Admitting: Neurology

## 2020-08-15 ENCOUNTER — Telehealth: Payer: Self-pay | Admitting: Neurology

## 2020-08-15 MED ORDER — MEMANTINE HCL 10 MG PO TABS
10.0000 mg | ORAL_TABLET | Freq: Two times a day (BID) | ORAL | 3 refills | Status: DC
Start: 2020-08-15 — End: 2020-08-16

## 2020-08-15 NOTE — Telephone Encounter (Signed)
Guzek, Randy(on [facility]) is calling to report that pt is doing well on the titration dose, pt is ready for the maintenance dose, please call son to discuss changing pt to  maintenance dose.

## 2020-08-15 NOTE — Telephone Encounter (Signed)
I called and left a message at the patient's son's cell phone stating I am going to prescribe Namenda 10 mg twice daily which is generic but if patient's insurance will cover Namenda XR 28 mg which is brand-name and more expensive I would prefer that.

## 2020-08-15 NOTE — Telephone Encounter (Signed)
Called and spoke to patient's son Harvie Heck (on [place]).  Stated patient has been on Memantine for past week 10 mg, 2x day and she has been tolerating it well.  He didn't think he could see a difference, however, he wanted to have her transition to the maintenance dose,  The prescription she currently has is for the titration dose.

## 2020-08-16 ENCOUNTER — Other Ambulatory Visit: Payer: Self-pay | Admitting: Emergency Medicine

## 2020-08-16 MED ORDER — MEMANTINE HCL 10 MG PO TABS
10.0000 mg | ORAL_TABLET | Freq: Two times a day (BID) | ORAL | 3 refills | Status: DC
Start: 2020-08-16 — End: 2020-08-16

## 2020-08-16 MED ORDER — MEMANTINE HCL 10 MG PO TABS
10.0000 mg | ORAL_TABLET | Freq: Two times a day (BID) | ORAL | 3 refills | Status: DC
Start: 2020-08-16 — End: 2020-09-13

## 2020-08-16 NOTE — Telephone Encounter (Signed)
Pt's son Harvie Heck called back and stated from what he understands the insurance does not cover the Brand Name but will cover the generic of Namenda. Please advise.

## 2020-08-16 NOTE — Telephone Encounter (Signed)
Okay I have already prescribed generic Namenda 10 mg twice daily for maintenance.

## 2020-08-16 NOTE — Addendum Note (Signed)
Addended by: Alexia Freestone R on: 08/16/2020 11:01 AM   Modules accepted: Orders

## 2020-09-09 ENCOUNTER — Other Ambulatory Visit: Payer: Self-pay | Admitting: Neurology

## 2020-09-29 ENCOUNTER — Ambulatory Visit: Payer: Medicare Other | Admitting: Neurology

## 2020-10-03 ENCOUNTER — Encounter: Payer: Self-pay | Admitting: Adult Health

## 2020-10-03 ENCOUNTER — Other Ambulatory Visit: Payer: Self-pay

## 2020-10-03 ENCOUNTER — Ambulatory Visit (INDEPENDENT_AMBULATORY_CARE_PROVIDER_SITE_OTHER): Payer: Medicare Other | Admitting: Adult Health

## 2020-10-03 VITALS — BP 135/87 | HR 83 | Ht 62.0 in | Wt 116.0 lb

## 2020-10-03 DIAGNOSIS — G301 Alzheimer's disease with late onset: Secondary | ICD-10-CM | POA: Diagnosis not present

## 2020-10-03 DIAGNOSIS — F0280 Dementia in other diseases classified elsewhere without behavioral disturbance: Secondary | ICD-10-CM | POA: Diagnosis not present

## 2020-10-03 NOTE — Progress Notes (Signed)
[facility] [address] [place]. [place] [address] [phone]       OFFICE FOLLOW UP NOTE  [name] Date of Birth:  Jul 04, 1932 Medical Record Number:  [id number]   Referring MD: Elon Alas Reason for Referral: Memory loss  Chief Complaint  Patient presents with  . Follow-up    memory, with son randy, states she is doing well, memory has remained the same      HPI:   Today, 10/03/2020, [name] returns for 70-month follow-up accompanied by her son, Mandy Adams, after prior initial consult visit with Dr. Pearlean Brownie on 07/20/2020 for memory loss concerns. MRI, EEG and dementia panel largely unremarkable. Initiated Namenda 10 mg twice daily after prior visit which she has tolerated well without side effects. Since prior visit, both patient and son report stable cognition.  MMSE today 21/30 (prior 20/30).  She lives independently able to maintain ADLs independently but does have home agency with aide assistance M-F to assist with IADLs.  No behavioral concerns.  No further concerns at this time.    History provided for reference purposes only Initial consult visit 07/20/2020 Dr. Pearlean Brownie: Ms. Stare is a pleasant 86 year old Caucasian lady seen today for initial office consultation visit for memory loss.  She is accompanied by her daughter.  History is obtained from them, review of referral notes and electronic medical records.  No imaging films available for review today.  She has a past medical history of hyperlipidemia, hypertension, eczema, back pain, dizziness, esophagitis who has noticed cognitive decline with memory loss, confusion and disorientation for the last several months.  This was brought on by recent urine tract infection in July 2021 which has been treated but her deficits have persisted.  Patient has apparently not had any previous cognitive eval.  She is currently living at home with family has brought in a caregiver to help 4 to 5 hours a day.  Patient has  not been driving for years and her daughter does have finances.  She can ambulate independently but recently has started using a cane.  Her balance has not been as good but she has had no falls or injuries.  The daughter denies any delusions or hallucinations, unsafe behavior.  Patient does get at times disoriented and confused but can be redirected easily.  She has not tried any medications like Aricept or any other over-the-counter memory pills.  She has no prior history of strokes, TIAs, migraines, seizures, significant head injury with loss of consciousness.  There is no family history of dementia.  The family was concerned because she had not been eating well and in fact they found an ulcer at her bottom indicating that she had not been ambulating and moving around as much as she should have been.  ROS:   14 system review of systems is positive for those listed in HPI and all other systems negative  PMH:  Past Medical History:  Diagnosis Date  . AKI (acute kidney injury) (HCC) 04/30/2018  . Community acquired pneumonia 04/30/2018  . Leukocytosis 04/30/2018    Social History:  Social History   Socioeconomic History  . Marital status: Married    Spouse name: Not on file  . Number of children: Not on file  . Years of education: Not on file  . Highest education level: Not on file  Occupational History  . Not on file  Tobacco Use  . Smoking status: Never Smoker  . Smokeless tobacco: Never Used  Vaping Use  .  Vaping Use: Never used  Substance and Sexual Activity  . Alcohol use: Never  . Drug use: Never  . Sexual activity: Not on file  Other Topics Concern  . Not on file  Social History Narrative  . Not on file   Social Determinants of Health   Financial Resource Strain:   . Difficulty of Paying Living Expenses: Not on file  Food Insecurity:   . Worried About Programme researcher, broadcasting/film/video in the Last Year: Not on file  . Ran Out of Food in the Last Year: Not on file  Transportation  Needs:   . Lack of Transportation (Medical): Not on file  . Lack of Transportation (Non-Medical): Not on file  Physical Activity:   . Days of Exercise per Week: Not on file  . Minutes of Exercise per Session: Not on file  Stress:   . Feeling of Stress : Not on file  Social Connections:   . Frequency of Communication with Friends and Family: Not on file  . Frequency of Social Gatherings with Friends and Family: Not on file  . Attends Religious Services: Not on file  . Active Member of Clubs or Organizations: Not on file  . Attends Banker Meetings: Not on file  . Marital Status: Not on file  Intimate Partner Violence:   . Fear of Current or Ex-Partner: Not on file  . Emotionally Abused: Not on file  . Physically Abused: Not on file  . Sexually Abused: Not on file    Medications:   Current Outpatient Medications on File Prior to Visit  Medication Sig Dispense Refill  . diclofenac Sodium (VOLTAREN) 1 % GEL Apply 2 g topically 4 (four) times daily as needed (pain).    Marland Kitchen estradiol (ESTRACE) 0.1 MG/GM vaginal cream Place 1 Applicatorful vaginally 3 (three) times a week.    . memantine (NAMENDA) 10 MG tablet TAKE 1 TABLET BY MOUTH TWICE A DAY 180 tablet 2  . pantoprazole (PROTONIX) 40 MG tablet Take 40 mg by mouth every other day.    . simvastatin (ZOCOR) 40 MG tablet Take 40 mg by mouth daily at 6 PM.    . TOPROL XL 25 MG 24 hr tablet Take 25 mg by mouth daily.     No current facility-administered medications on file prior to visit.    Allergies:  No Known Allergies  Physical  Today's Vitals   10/03/20 1526  BP: 135/87  Pulse: 83  Weight: 116 lb (52.6 kg)  Height: 5\' 2"  (1.575 m)   Body mass index is 21.22 kg/m.  General: Frail looking petite very pleasant elderly Caucasian lady seated, in no evident distress Head: head normocephalic and atraumatic.   Neck: supple with no carotid or supraclavicular bruits Cardiovascular: regular rate and rhythm, no  murmurs Musculoskeletal: Severe kyphoscoliosis. Skin:  no rash/petichiae Vascular:  Normal pulses all extremities  Neurologic Exam Mental Status: Awake and fully alert. Oriented to place and time. Recent and remote memory poor.  Attention span, concentration and fund of knowledge diminished. Mood and affect appropriate.   MMSE - Mini Mental State Exam 10/03/2020 07/20/2020  Orientation to time 4 2  Orientation to Place 5 5  Registration 3 3  Attention/ Calculation 1 1  Recall 0 0  Language- name 2 objects 2 2  Language- repeat 1 1  Language- follow 3 step command 3 3  Language- read & follow direction 1 1  Write a sentence 1 1  Copy design 0 1  Copy  design-comments 6 animals 4 animals  Total score 21 20   Cranial Nerves: Pupils equal, briskly reactive to light. Extraocular movements full without nystagmus. Visual fields full to confrontation.  HOH bilaterally.  Facial sensation intact. Face, tongue, palate moves normally and symmetrically.  Motor: Normal bulk and tone. Normal strength in all tested extremity muscles. Sensory.: intact to touch , pinprick , position and vibratory sensation.  Coordination: Rapid alternating movements normal in all extremities. Finger-to-nose and heel-to-shin performed accurately bilaterally. Gait and Station: Arises from chair without difficulty. Stance is slightly stooped. Gait demonstrates normal stride length and balance uses a cane to walk.  Tandem walking not tested. Reflexes: 1+ and symmetric. Toes downgoing.       ASSESSMENT/PLAN: Ms. Kathan is a very pleasant 86 year old Caucasian lady with subacute memory and cognitive decline likely due to Alzheimer's dementia.  Evaluation for reversible causes unremarkable.  MR brain w/wo 08/03/2020 moderate atrophy, moderate ventriculomegaly and mild chronic small vessel ischemic disease. No evidence of new or prior infarcts. EEG 08/02/2020 showed nonspecific mild bihemispheric slowing without definite  epileptiform activity identified Dementia panel 07/20/2020 satisfactory  Cognition stable since prior visit 2 months ago.  Advised to continue Namenda 10 mg twice daily.  No behavioral related concerns at this time.  Advised use of cane at all times and assistance for IADLs as indicated.  Advised son to call office with any questions or concerns regarding her cognition or possible related behaviors.   Follow-up in 6 months or call earlier if needed   CC:  [facility] provider: Dr. Carron Brazen, Darlen Round, MD   I spent 30 minutes of face-to-face and non-face-to-face time with patient and son.  This included previsit chart review, lab review, study review, order entry, electronic health record documentation, patient and son education and discussion regarding cognitive decline likely Alzheimer's dementia, use of Namenda, completion and review of MMSE and answered all other questions to patient and son satisfaction  Ihor Austin, AGNP-BC  [facility] [address] [place], [place] [address]  Phone [phone] Fax [phone] Note: This document was prepared with digital dictation and possible smart phrase technology. Any transcriptional errors that result from this process are unintentional.

## 2020-10-03 NOTE — Patient Instructions (Signed)
Your Plan:  Your memory has been stable since prior visit - memory testing today 21/30 with prior testing 20/30  Continue Namenda 10mg  twice daily     Follow up in 6 months or call earlier if needed     Thank you for coming to see at [facility]. I hope we have been able to provide you high quality care today.  You may receive a patient satisfaction survey over the next few weeks. We would appreciate your feedback and comments so that we may continue to improve ourselves and the health of our patients.

## 2020-10-04 NOTE — Progress Notes (Signed)
I agree with the above plan 

## 2021-01-27 IMAGING — CR DG CHEST 2V
2 series · 2 of 2 positions shown · non-contrast
Comparison: Chest radiograph dated 04/30/2018 and CT dated
04/30/2018

CLINICAL DATA: 88-year-old female with shortness of breath.

EXAM:
CHEST - 2 VIEW

[w chest pa]
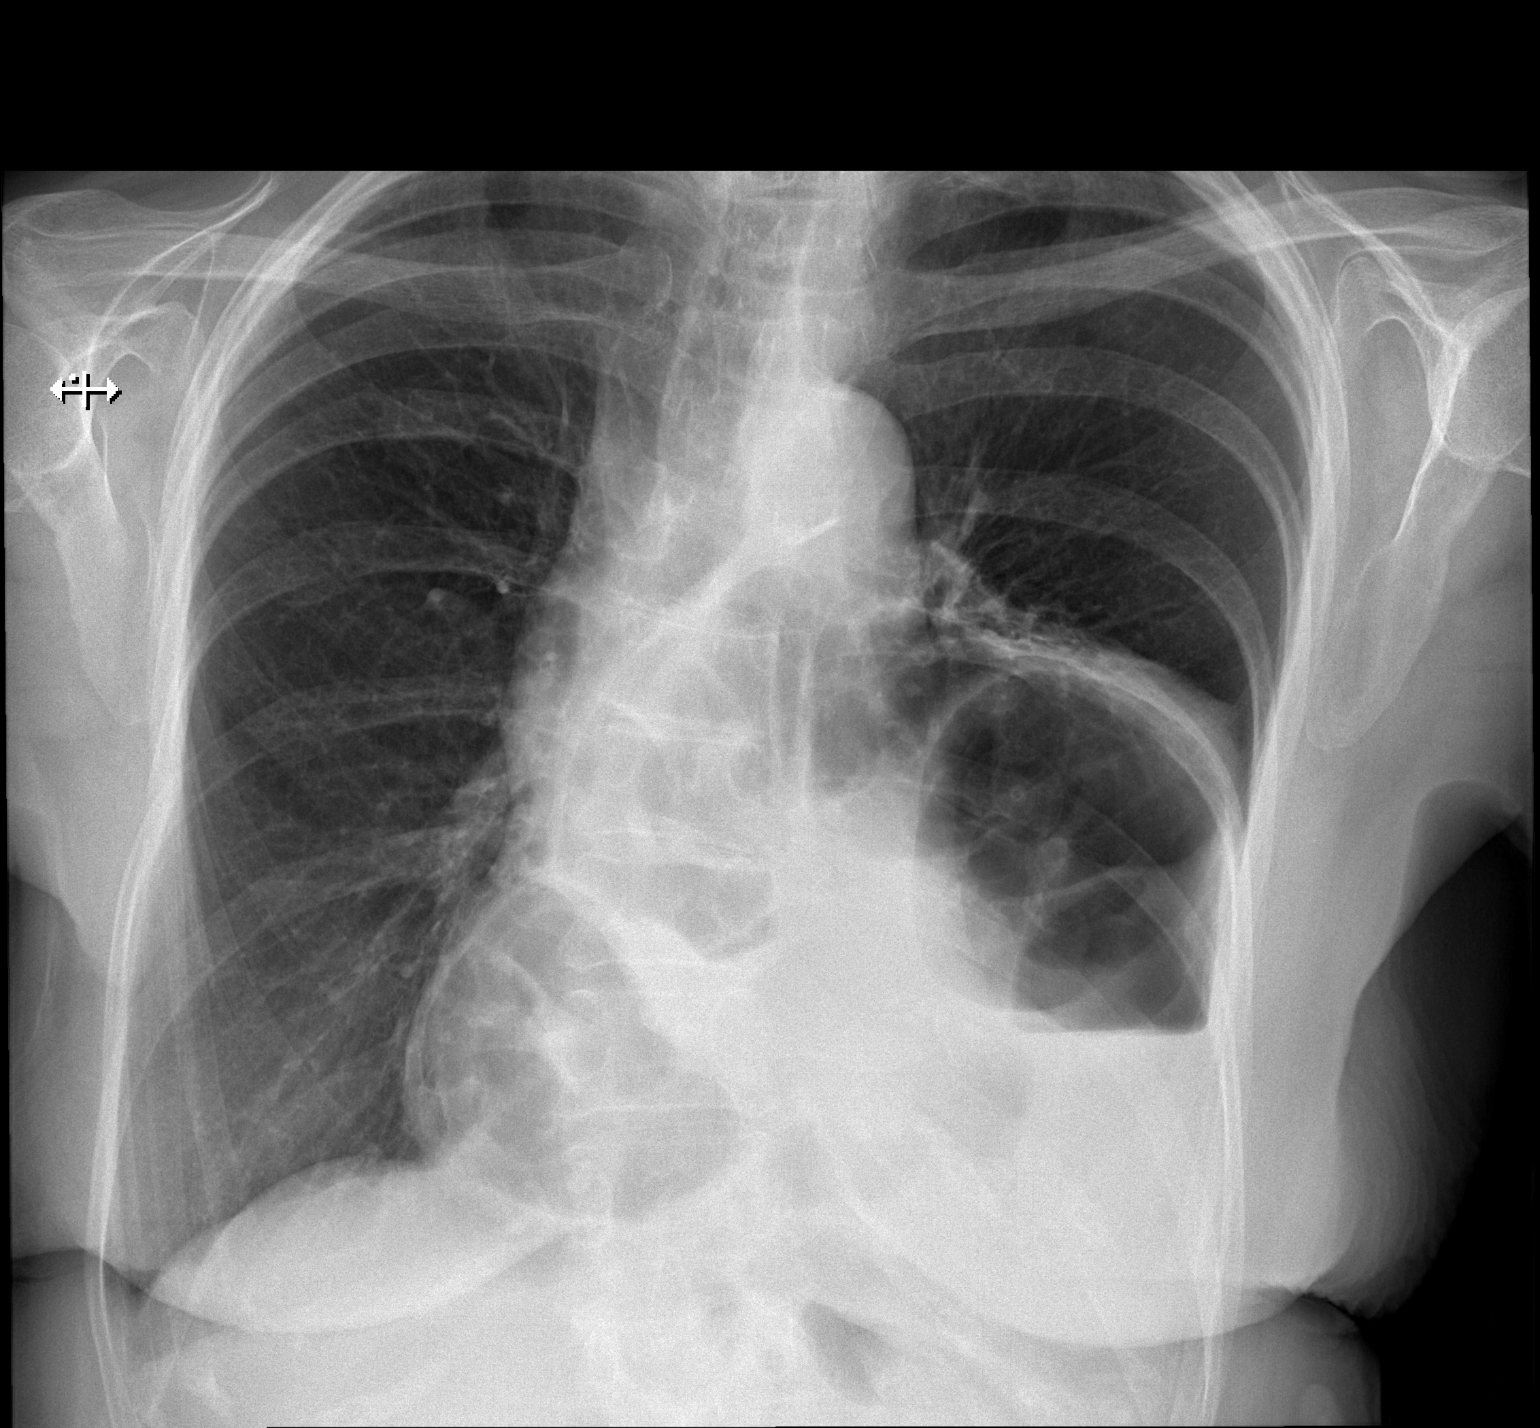

[w chest lat]
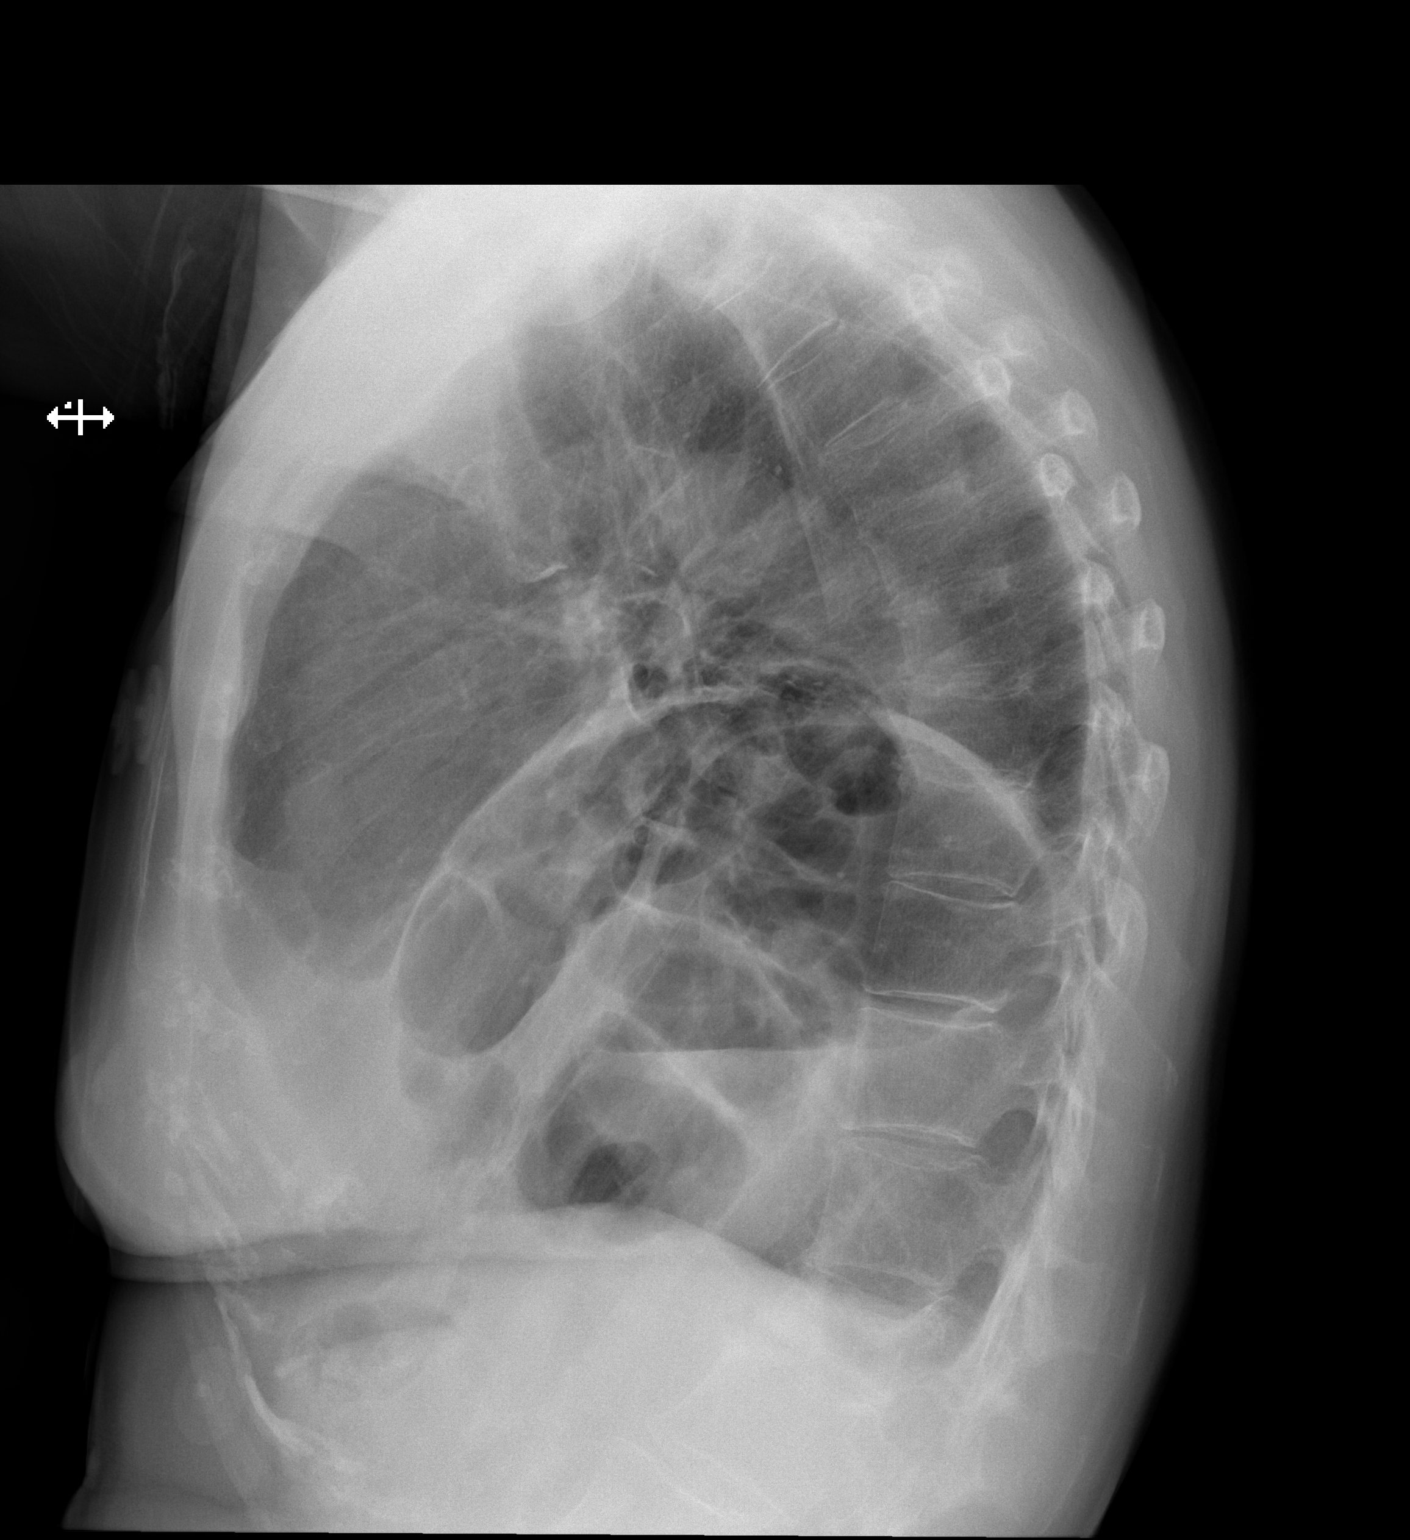

[2 of 2 positions shown; findings below may reference images not displayed]

FINDINGS: Large left diaphragmatic hernia similar or slightly increased in
size since the prior radiograph. There is associated partial
compressive atelectasis of the left lung base. No consolidative
changes. There is no pleural effusion pneumothorax. Atherosclerotic
calcification of the aorta. Several dilated loops of bowel in the
hernia likely colonic loops. Clinical correlation is recommended to
exclude bowel obstruction. No acute osseous pathology.
IMPRESSION: 1. No acute cardiopulmonary process.
2. Large left diaphragmatic hernia similar or slightly increased in
size since the prior radiograph.

## 2021-04-03 ENCOUNTER — Encounter: Payer: Self-pay | Admitting: Adult Health

## 2021-04-03 ENCOUNTER — Ambulatory Visit (INDEPENDENT_AMBULATORY_CARE_PROVIDER_SITE_OTHER): Payer: Medicare Other | Admitting: Adult Health

## 2021-04-03 VITALS — BP 144/80 | HR 68 | Ht 60.0 in | Wt 122.0 lb

## 2021-04-03 DIAGNOSIS — G301 Alzheimer's disease with late onset: Secondary | ICD-10-CM | POA: Diagnosis not present

## 2021-04-03 DIAGNOSIS — F0280 Dementia in other diseases classified elsewhere without behavioral disturbance: Secondary | ICD-10-CM | POA: Diagnosis not present

## 2021-04-03 MED ORDER — MEMANTINE HCL 10 MG PO TABS: 10.0000 mg | ORAL_TABLET | Freq: Two times a day (BID) | ORAL | 3 refills | Status: DC

## 2021-04-03 NOTE — Patient Instructions (Signed)
Your Plan:  Continue Namenda 10 mg twice daily    Follow-up in 1 year or call earlier if needed     Thank you for coming to see [place] at [facility]. I hope we have been able to provide you high quality care today.  You may receive a patient satisfaction survey over the next few weeks. We would appreciate your feedback and comments so that we may continue to improve ourselves and the health of our patients.    Dementia Dementia is a condition that affects the way the brain works. It often affects memory and thinking. There are many types of dementia. Some types get worse with time and cannot be reversed. Some types of dementia include:  Alzheimer's disease. This is the most common type.  Vascular dementia. This type may happen due to a stroke.  Lewy body dementia. This type may happen to people who have Parkinson's disease.  Frontotemporal dementia. This type is caused by damage to nerve cells in certain parts of the brain. Some people may have more than one type. What are the causes? This condition is caused by damage to cells in the brain. Some causes that cannot be reversed include:  Having a condition that affects the blood vessels of the brain, such as diabetes, heart disease, or blood vessel disease.  Changes to genes. Some causes that can be reversed or slowed include:  Injury to the brain.  Certain medicines.  Infection.  Not having enough vitamin B12 in the body, or thyroid problems.  A tumor, blood clot, or too much fluid in the brain.  Certain diseases that cause your body's defense system (immune system) to attack healthy parts of the body. What are the signs or symptoms?  Problems remembering events or people.  Having trouble taking a bath or putting clothes on.  Forgetting appointments.  Forgetting to pay bills.  Trouble planning and making meals.  Having trouble speaking.  Getting lost easily.  Changes in behavior or  mood. How is this treated? Treatment depends on the cause of the dementia. It might include:  Taking medicines for symptoms or to help control or slow down the dementia.  Treating the cause of your dementia. Your doctor can help you find support groups and other doctors who can help with your care. Follow these instructions at home: Medicines  Take over-the-counter and prescription medicines only as told by your doctor.  Use a pill organizer to help you manage your medicines.  Avoidtaking medicines for pain or for sleep. Lifestyle  Make healthy choices: ? Be active as told by your doctor. ? Do not smoke or use any products that contain nicotine or tobacco. If you need help quitting, ask your doctor. ? Do not drink alcohol. ? When you get stressed, do something that will help you relax. Your doctor can give you tips. ? Spend time with other people.  Make sure you get good sleep. To get good sleep: ? Try not to take naps during the day. ? Keep your bedroom dark and cool. ? In the few hours before you go to bed, try not to do any exercise. ? Do not have foods and drinks with caffeine at night. Eating and drinking  Drink enough fluid to keep your pee (urine) pale yellow.  Eat a healthy diet. General instructions  Talk with your doctor to figure out: ? What you need help with. ? What your safety needs are.  Ask your doctor if it is safe  for you to drive.  If told, wear a bracelet that tracks where you are or shows that you are a person with memory loss.  Work with your family to make big decisions.  Keep all follow-up visits.   Where to find more information  Alzheimer's Association: LimitLaws.hu  [employer] on Aging: CashCowGambling.be  World Health Organization: https://castaneda-walker.com/ Contact a doctor if:  You have any new symptoms.  Your symptoms get worse.  You have problems with swallowing or choking. Get help right away if:  You feel very sad, or  feel that you want to harm yourself.  Your family members are worried for your safety. Get help right away if you feel like you may hurt yourself or others, or have thoughts about taking your own life. Go to your nearest emergency room or:  Call your local emergency services (911 in the [place]  Call the National Suicide Prevention Lifeline at 828-205-7246. This is open 24 hours a day.  Text the Crisis Text Line at 463-097-1630. Summary  Dementia often affects memory and thinking.  Some types of dementia get worse with time and cannot be reversed.  Treatment for this condition depends on the cause.  Talk with your doctor to figure out what you need help with.  Your doctor can help you find support groups and other doctors who can help with your care. This information is not intended to replace advice given to you by your health care provider. Make sure you discuss any questions you have with your health care provider. Document Revised: 03/14/2020 Document Reviewed: 03/14/2020 Elsevier Patient Education  2021 [employer].

## 2021-04-03 NOTE — Progress Notes (Signed)
I agree with the above plan 

## 2021-04-03 NOTE — Progress Notes (Signed)
[facility] [address] [place]. [place] [address] [phone]       OFFICE FOLLOW UP NOTE  Mandy Adams Date of Birth:  1931-11-25 Medical Record Number:  [id number]   Referring MD: Elon Alas Reason for Referral: Memory loss  Chief Complaint  Patient presents with  . Follow-up    Tr with spouse (randy) Pt is well and stable per her and husband. No new complications      HPI:   Today, 04/03/2021, Mandy Adams returns for 6 months follow-up accompanied by her son Mandy Adams.  She has been doing well since prior visit.  Her cognition has been stable and she remains living independently with aide assistance 5 days weekly for 4 hours as well as very supportive son and daughter checking on her routinely.  She remains on Namenda 10 mg twice daily tolerating without side effects.  No behavioral concerns.  No concerns at this time  MMSE - Mini Mental State Exam 04/03/2021 10/03/2020 07/20/2020  Orientation to time 1 4 2   Orientation to Place 4 5 5   Registration 3 3 3   Attention/ Calculation 5 1 1   Recall 0 0 0  Language- name 2 objects 2 2 2   Language- repeat 1 1 1   Language- follow 3 step command 3 3 3   Language- read & follow direction 1 1 1   Write a sentence 1 1 1   Copy design 0 0 1  Copy design-comments - 6 animals 4 animals  Total score 21 21 20     History provided for reference purposes only Update 10/03/2020 JM: Mandy Adams returns for 11-month follow-up accompanied by her son, , after prior initial consult visit with Dr. on 07/20/2020 for memory loss concerns. MRI, EEG and dementia panel largely unremarkable. Initiated Namenda 10 mg twice daily after prior visit which she has tolerated well without side effects. Since prior visit, both patient and son report stable cognition.  MMSE today 21/30 (prior 20/30).  She lives independently able to maintain ADLs independently but does have home agency with aide assistance M-F to assist with IADLs.  No  behavioral concerns.  No further concerns at this time.  Initial consult visit 07/20/2020 Dr. : Mandy Adams is a pleasant 87 year old Caucasian lady seen today for initial office consultation visit for memory loss.  She is accompanied by her daughter.  History is obtained from them, review of referral notes and electronic medical records.  No imaging films available for review today.  She has a past medical history of hyperlipidemia, hypertension, eczema, back pain, dizziness, esophagitis who has noticed cognitive decline with memory loss, confusion and disorientation for the last several months.  This was brought on by recent urine tract infection in July 2021 which has been treated but her deficits have persisted.  Patient has apparently not had any previous cognitive eval.  She is currently living at home with family has brought in a caregiver to help 4 to 5 hours a day.  Patient has not been driving for years and her daughter does have finances.  She can ambulate independently but recently has started using a cane.  Her balance has not been as good but she has had no falls or injuries.  The daughter denies any delusions or hallucinations, unsafe behavior.  Patient does get at times disoriented and confused but can be redirected easily.  She has not tried any medications like Aricept or any other over-the-counter memory pills.  She has no prior history of strokes, TIAs,  migraines, seizures, significant head injury with loss of consciousness.  There is no family history of dementia.  The family was concerned because she had not been eating well and in fact they found an ulcer at her bottom indicating that she had not been ambulating and moving around as much as she should have been.  ROS:   14 system review of systems is positive for those listed in HPI and all other systems negative  PMH:  Past Medical History:  Diagnosis Date  . AKI (acute kidney injury) (HCC) 04/30/2018  . Community acquired  pneumonia 04/30/2018  . Leukocytosis 04/30/2018    Social History:  Social History   Socioeconomic History  . Marital status: Married    Spouse name: Not on file  . Number of children: Not on file  . Years of education: Not on file  . Highest education level: Not on file  Occupational History  . Not on file  Tobacco Use  . Smoking status: Never Smoker  . Smokeless tobacco: Never Used  Vaping Use  . Vaping Use: Never used  Substance and Sexual Activity  . Alcohol use: Never  . Drug use: Never  . Sexual activity: Not on file  Other Topics Concern  . Not on file  Social History Narrative  . Not on file   Social Determinants of Health   Financial Resource Strain: Not on file  Food Insecurity: Not on file  Transportation Needs: Not on file  Physical Activity: Not on file  Stress: Not on file  Social Connections: Not on file  Intimate Partner Violence: Not on file    Medications:   Current Outpatient Medications on File Prior to Visit  Medication Sig Dispense Refill  . diclofenac Sodium (VOLTAREN) 1 % GEL Apply 2 g topically 4 (four) times daily as needed (pain).    Marland Kitchen estradiol (ESTRACE) 0.1 MG/GM vaginal cream Place 1 Applicatorful vaginally 3 (three) times a week.    . memantine (NAMENDA) 10 MG tablet TAKE 1 TABLET BY MOUTH TWICE A DAY 180 tablet 2  . pantoprazole (PROTONIX) 40 MG tablet Take 40 mg by mouth every other day.    . simvastatin (ZOCOR) 40 MG tablet Take 40 mg by mouth daily at 6 PM.    . TOPROL XL 25 MG 24 hr tablet Take 25 mg by mouth daily.     No current facility-administered medications on file prior to visit.    Allergies:  No Known Allergies  Physical  Today's Vitals   04/03/21 1425  BP: (!) 144/80  Pulse: 68  Weight: 122 lb (55.3 kg)  Height: 5' (1.524 m)   Body mass index is 23.83 kg/m.  General: Frail looking petite very pleasant elderly Caucasian lady seated, in no evident distress Head: head normocephalic and atraumatic.   Neck:  supple with no carotid or supraclavicular bruits Cardiovascular: regular rate and rhythm, no murmurs Musculoskeletal: Severe kyphoscoliosis. Skin:  no rash/petichiae Vascular:  Normal pulses all extremities  Neurologic Exam Mental Status: Awake and fully alert. Oriented to place and self but disoriented to time. Recent memory diminished and remote memory intact.  Attention span, concentration and fund of knowledge diminished. Mood and affect appropriate.  MMSE today 21/30 with deficits in orientation to time, recall and drawing Cranial Nerves: Pupils equal, briskly reactive to light. Extraocular movements full without nystagmus. Visual fields full to confrontation.  HOH bilaterally.  Facial sensation intact. Face, tongue, palate moves normally and symmetrically.  Motor: Normal bulk and tone. Normal  strength in all tested extremity muscles. Sensory.: intact to touch , pinprick , position and vibratory sensation.  Coordination: Rapid alternating movements normal in all extremities. Finger-to-nose and heel-to-shin performed accurately bilaterally. Gait and Station: Arises from chair without difficulty. Stance is slightly stooped. Gait demonstrates normal stride length and balance with use of a cane to walk.  Tandem walking not tested. Reflexes: 1+ and symmetric. Toes downgoing.       ASSESSMENT/PLAN: Mandy Adams is a very pleasant 87 year old Caucasian lady with subacute memory and cognitive decline likely due to Alzheimer's dementia.  Evaluation for reversible causes unremarkable including MRI, EEG and dementia panel.   Cognition remains stable without worsening or behavioral concerns Continue Namenda 10 mg twice daily -refill provided Advised use of cane at all times and assistance for IADLs as indicated    Follow-up in 1 year or call earlier if needed   CC:  [facility] provider: Dr. Carron Brazen, Darlen Round, MD    Ihor Austin, [facility]  [facility] [address]  [address] [place], [place] [address]  Phone [phone] Fax [phone] Note: This document was prepared with digital dictation and possible smart phrase technology. Any transcriptional errors that result from this process are unintentional.

## 2021-05-08 ENCOUNTER — Emergency Department (HOSPITAL_BASED_OUTPATIENT_CLINIC_OR_DEPARTMENT_OTHER)
Admission: EM | Admit: 2021-05-08 | Discharge: 2021-05-08 | Disposition: A | Discharge status: Home / Self Care | Payer: Medicare Other | Attending: Emergency Medicine | Admitting: Emergency Medicine

## 2021-05-08 ENCOUNTER — Other Ambulatory Visit: Payer: Self-pay

## 2021-05-08 ENCOUNTER — Encounter (HOSPITAL_BASED_OUTPATIENT_CLINIC_OR_DEPARTMENT_OTHER): Payer: Self-pay

## 2021-05-08 DIAGNOSIS — U071 COVID-19: Secondary | ICD-10-CM | POA: Insufficient documentation

## 2021-05-08 DIAGNOSIS — R059 Cough, unspecified: Secondary | ICD-10-CM | POA: Diagnosis present

## 2021-05-08 LAB — BASIC METABOLIC PANEL
Anion gap: 8 (ref 5–15)
BUN: 22 mg/dL (ref 8–23)
CO2: 25 mmol/L (ref 22–32)
Calcium: 9.5 mg/dL (ref 8.9–10.3)
Chloride: 102 mmol/L (ref 98–111)
Creatinine, Ser: 1.14 mg/dL — ABNORMAL HIGH (ref 0.44–1.00)
GFR, Estimated: 47 mL/min — ABNORMAL LOW (ref >60)
Glucose, Bld: 109 mg/dL — ABNORMAL HIGH (ref 70–99)
Potassium: 4.7 mmol/L (ref 3.5–5.1)
Sodium: 135 mmol/L (ref 135–145)

## 2021-05-08 MED ORDER — NIRMATRELVIR/RITONAVIR (PAXLOVID)TABLET: 2.0000 | ORAL_TABLET | Freq: Two times a day (BID) | ORAL | 0 refills | Status: AC

## 2021-05-08 NOTE — ED Provider Notes (Signed)
[facility] EMERGENCY DEPT Provider Note   CSN: [id number] Arrival date & time: 05/08/21  2113     History Chief Complaint  Patient presents with   Shortness of Breath    Mandy Adams is a 87 y.o. female.  Presents with chief complaint of cough congestion generalized malaise ongoing for 3 days now.  She tested positive for COVID today and presents to the ER.  She states she is otherwise has been vaccinated.  Denies any headache or chest pain or difficulty breathing at this time.      Past Medical History:  Diagnosis Date   AKI (acute kidney injury) (HCC) 04/30/2018   Community acquired pneumonia 04/30/2018   Leukocytosis 04/30/2018    Patient Active Problem List   Diagnosis Date Noted   Hypokalemia 04/30/2018   Community acquired pneumonia 04/30/2018   Leukocytosis 04/30/2018   AKI (acute kidney injury) (HCC) 04/30/2018   Diaphragmatic hernia without obstruction and without gangrene     Past Surgical History:  Procedure Laterality Date   ABDOMINAL HYSTERECTOMY       OB History   No obstetric history on file.     Family History  Problem Relation Age of Onset   Heart failure Father     Social History   Tobacco Use   Smoking status: Never   Smokeless tobacco: Never  Vaping Use   Vaping Use: Never used  Substance Use Topics   Alcohol use: Never   Drug use: Never    Home Medications Prior to Admission medications   Medication Sig Start Date End Date Taking? Authorizing Provider  diclofenac Sodium (VOLTAREN) 1 % GEL Apply 2 g topically 4 (four) times daily as needed (pain).    [provider]  estradiol (ESTRACE) 0.1 MG/GM vaginal cream Place 1 Applicatorful vaginally 3 (three) times a week.    [provider]  memantine (NAMENDA) 10 MG tablet Take 1 tablet (10 mg total) by mouth 2 (two) times daily. 04/03/21   Ihor Austin, NP  pantoprazole (PROTONIX) 40 MG tablet Take 40 mg by mouth every other day. 02/04/18    [provider]  simvastatin (ZOCOR) 40 MG tablet Take 40 mg by mouth daily at 6 PM.    [provider]  TOPROL XL 25 MG 24 hr tablet Take 25 mg by mouth daily. 02/04/18   [provider]    Allergies    Patient has no known allergies.  Review of Systems   Review of Systems  Constitutional:  Positive for fever.  HENT:  Negative for ear pain.   Eyes:  Negative for pain.  Respiratory:  Positive for cough.   Cardiovascular:  Negative for chest pain.  Gastrointestinal:  Negative for abdominal pain.  Genitourinary:  Negative for flank pain.  Musculoskeletal:  Negative for back pain.  Skin:  Negative for rash.  Neurological:  Negative for headaches.   Physical Exam Updated Vital Signs BP 138/81 (BP Location: Right Arm)   Pulse 94   Temp 100.2 F (37.9 C) (Oral)   Resp 18   Ht 5\' 2"  (1.575 m)   Wt 55 kg   SpO2 97%   BMI 22.18 kg/m   Physical Exam Constitutional:      General: She is not in acute distress.    Appearance: Normal appearance.  HENT:     Head: Normocephalic.     Nose: Nose normal.  Eyes:     Extraocular Movements: Extraocular movements intact.  Cardiovascular:     Rate  and Rhythm: Normal rate.  Pulmonary:     Effort: Pulmonary effort is normal.  Musculoskeletal:        General: Normal range of motion.     Cervical back: Normal range of motion.  Neurological:     General: No focal deficit present.     Mental Status: She is alert. Mental status is at baseline.    ED Results / Procedures / Treatments   Labs (all labs ordered are listed, but only abnormal results are displayed) Labs Reviewed - No data to display  EKG None  Radiology No results found.  Procedures Procedures   Medications Ordered in ED Medications - No data to display  ED Course  I have reviewed the triage vital signs and the nursing notes.  Pertinent labs & imaging results that were available during my care of the patient were reviewed by me and  considered in my medical decision making (see chart for details).    MDM Rules/Calculators/A&P                          Patient presents with outpatient COVID-positive test with symptoms.  She is on day 4 today.  We will recommend Paxil today given her symptoms.  Advised immediate return for difficulty breathing or any additional concerns, advised isolation until 5 days after symptoms resolved.   Final Clinical Impression(s) / ED Diagnoses Final diagnoses:  COVID-19 virus infection    Rx / DC Orders ED Discharge Orders     None        Cheryll Cockayne, MD 05/08/21 2239

## 2021-05-08 NOTE — Discharge Instructions (Addendum)
You will need to stop simvastatin while taking Paxlovid.  After you have taken your last dose of Paxlovid,you will need to continue to hold simvastatin for 5 additional days .  If you had any testing done today, the results will show up on your MyChart phone app in 24 hours.  Call your primary care doctor in the next 1-2 days to arrange video follow-up.   Use a finger pulse oximeter at home.  You may purchase one at [facility] or [facility] or online.  If the numbers drops and stays below 90%, return immediately back to the ER.  Otherwise increase your fluid intake, isolate at home for 5 days after symptoms resolve, and inform recent close contacts of the need to test for Covid.

## 2021-05-08 NOTE — ED Triage Notes (Signed)
Pt was tested covid positive today  - home test   Per son , he got a call from home health staff that her mom have been having cough and SOB when she walk today   Pt has hx dementia

## 2021-05-27 ENCOUNTER — Other Ambulatory Visit: Payer: Self-pay | Admitting: Neurology

## 2021-07-12 ENCOUNTER — Other Ambulatory Visit: Payer: Self-pay

## 2021-07-12 ENCOUNTER — Encounter (HOSPITAL_BASED_OUTPATIENT_CLINIC_OR_DEPARTMENT_OTHER): Payer: Medicare Other | Attending: Physician Assistant | Admitting: Physician Assistant

## 2021-07-12 DIAGNOSIS — F0150 Vascular dementia without behavioral disturbance: Secondary | ICD-10-CM | POA: Insufficient documentation

## 2021-07-12 DIAGNOSIS — Z90710 Acquired absence of both cervix and uterus: Secondary | ICD-10-CM | POA: Diagnosis not present

## 2021-07-12 DIAGNOSIS — Z8249 Family history of ischemic heart disease and other diseases of the circulatory system: Secondary | ICD-10-CM | POA: Insufficient documentation

## 2021-07-12 DIAGNOSIS — I10 Essential (primary) hypertension: Secondary | ICD-10-CM | POA: Diagnosis not present

## 2021-07-12 DIAGNOSIS — L308 Other specified dermatitis: Secondary | ICD-10-CM | POA: Diagnosis present

## 2021-07-12 NOTE — Progress Notes (Signed)
DULSE, RUTAN ([id number]) Visit Report for 07/12/2021 Allergy List Details Patient Name: Date of Service: Mandy Adams [facility] 07/12/2021 1:15 PM Medical Record Number: [id number] Patient Account Number: [id number] Date of Birth/Sex: Treating RN: Aug 07, 1932 (87 y.o. Tommye Standard Primary Care Ronica Vivian: Gildardo Cranker Other Clinician: Referring Noam Karaffa: Treating Syanna Remmert/Extender: Felipe Drone, Talbert Forest in Treatment: 0 Allergies Active Allergies No Known Allergies Allergy Notes Electronic Signature(s) Signed: 07/12/2021 4:49:46 PM By: Zenaida Deed RN, BSN Entered By: Zenaida Deed on 07/12/2021 13:15:40 -------------------------------------------------------------------------------- Arrival Information Details Patient Name: Date of Service: Mandy Adams [facility] 07/12/2021 1:15 PM Medical Record Number: [id number] Patient Account Number: [id number] Date of Birth/Sex: Treating RN: Oct 13, 1932 (87 y.o. Tommye Standard Primary Care Chelly Dombeck: Gildardo Cranker Other Clinician: Referring Nelia Rogoff: Treating Ananya Mccleese/Extender: Jonell Cluck in Treatment: 0 Visit Information Patient Arrived: Danella Maiers Time: 13:10 Accompanied By: daughter Transfer Assistance: None Patient Identification Verified: Yes Secondary Verification Process Completed: Yes Patient Requires Transmission-Based Precautions: No Patient Has Alerts: No Electronic Signature(s) Signed: 07/12/2021 4:49:46 PM By: Zenaida Deed RN, BSN Entered By: Zenaida Deed on 07/12/2021 13:12:58 -------------------------------------------------------------------------------- Clinic Level of Care Assessment Details Patient Name: Date of Service: Mandy Adams [facility] 07/12/2021 1:15 PM Medical Record Number: [id number] Patient Account Number: [id number] Date of Birth/Sex: Treating RN: 02-08-1932 (87 y.o. Tommye Standard Primary Care Makyia Erxleben: Gildardo Cranker Other Clinician: Referring  Genice Kimberlin: Treating Vaiden Adames/Extender: Jonell Cluck in Treatment: 0 Clinic Level of Care Assessment Items TOOL 2 Quantity Score []  - 0 Use when only an EandM is performed on the INITIAL visit ASSESSMENTS - Nursing Assessment / Reassessment X- 1 20 General Physical Exam (combine w/ comprehensive assessment (listed just below) when performed on new pt. evals) X- 1 25 Comprehensive Assessment (HX, ROS, Risk Assessments, Wounds Hx, etc.) ASSESSMENTS - Wound and Skin A ssessment / Reassessment []  - 0 Simple Wound Assessment / Reassessment - one wound []  - 0 Complex Wound Assessment / Reassessment - multiple wounds []  - 0 Dermatologic / Skin Assessment (not related to wound area) ASSESSMENTS - Ostomy and/or Continence Assessment and Care []  - 0 Incontinence Assessment and Management []  - 0 Ostomy Care Assessment and Management (repouching, etc.) PROCESS - Coordination of Care X - Simple Patient / Family Education for ongoing care 1 15 []  - 0 Complex (extensive) Patient / Family Education for ongoing care X- 1 10 Staff obtains , Records, T Results / Process Orders est []  - 0 Staff telephones HHA, Nursing Homes / Clarify orders / etc []  - 0 Routine Transfer to another Facility (non-emergent condition) []  - 0 Routine Hospital Admission (non-emergent condition) X- 1 15 New Admissions / / Ordering NPWT Apligraf, etc. , []  - 0 Emergency Hospital Admission (emergent condition) X- 1 10 Simple Discharge Coordination []  - 0 Complex (extensive) Discharge Coordination PROCESS - Special Needs []  - 0 Pediatric / Minor Patient Management []  - 0 Isolation Patient Management []  - 0 Hearing / Language / Visual special needs []  - 0 Assessment of Community assistance (transportation, D/C planning, etc.) []  - 0 Additional assistance / Altered mentation []  - 0 Support Surface(s) Assessment (bed, cushion, seat, etc.) INTERVENTIONS  - Wound Cleansing / Measurement []  - 0 Wound Imaging (photographs - any number of wounds) []  - 0 Wound Tracing (instead of photographs) []  - 0 Simple Wound Measurement - one wound []  - 0 Complex Wound Measurement - multiple wounds []  - 0 Simple Wound Cleansing - one wound []  -  0 Complex Wound Cleansing - multiple wounds INTERVENTIONS - Wound Dressings []  - 0 Small Wound Dressing one or multiple wounds []  - 0 Medium Wound Dressing one or multiple wounds []  - 0 Large Wound Dressing one or multiple wounds []  - 0 Application of Medications - injection INTERVENTIONS - Miscellaneous []  - 0 External ear exam []  - 0 Specimen Collection (cultures, biopsies, blood, body fluids, etc.) []  - 0 Specimen(s) / Culture(s) sent or taken to Lab for analysis []  - 0 Patient Transfer (multiple staff / Lift / Similar devices) []  - 0 Simple Staple / Suture removal (25 or less) []  - 0 Complex Staple / Suture removal (26 or more) []  - 0 Hypo / Hyperglycemic Management (close monitor of Blood Glucose) []  - 0 Ankle / Brachial Index (ABI) - do not check if billed separately Has the patient been seen at the hospital within the last three years: Yes Total Score: 95 Level Of Care: New/Established - Level 3 Electronic Signature(s) Signed: 07/12/2021 4:49:46 PM By: RN, BSN Entered By: on 07/12/2021 13:55:20 -------------------------------------------------------------------------------- Encounter Discharge Information Details Patient Name: Date of Service: [facility] 07/12/2021 1:15 PM Medical Record Number: Michiel Sites Patient Account Number: Date of Birth/Sex: Treating RN: [birth year] (87 y.o. Primary Care Johni Narine: 07/14/2021 Other Clinician: Referring Jabori Henegar: Treating Neah Sporrer/Extender: Zenaida Deed in Treatment: 0 Encounter Discharge Information Items Discharge Condition: Stable Ambulatory  Status: Cane Discharge Destination: Home Transportation: Private Auto Accompanied By: daughter Schedule Follow-up Appointment: Yes Clinical Summary of Care: Patient Declined Electronic Signature(s) Signed: 07/12/2021 4:49:46 PM By: 07/14/2021 RN, BSN Entered By: Mandy Adams on 07/12/2021 14:06:33 -------------------------------------------------------------------------------- Lower Extremity Assessment Details Patient Name: Date of Service: 07/14/2021 [facility] 07/12/2021 1:15 PM Medical Record Number: 02/11/1932 Patient Account Number: [id number] Date of Birth/Sex: Treating RN: [birth year] (87 y.o. Jonell Cluck Primary Care Danni Shima: 07/14/2021 Other Clinician: Referring Evalin Shawhan: Treating Nikai Quest/Extender: Zenaida Deed in Treatment: 0 Electronic Signature(s) Signed: 07/12/2021 4:49:46 PM By: 07/14/2021 RN, BSN Signed: 07/12/2021 4:49:46 PM By: [facility] RN, BSN Entered By: 07/14/2021 on 07/12/2021 13:26:07 -------------------------------------------------------------------------------- Pain Assessment Details Patient Name: Date of Service: [id number] [facility] 07/12/2021 1:15 PM Medical Record Number: Tommye Standard Patient Account Number: Gildardo Cranker Date of Birth/Sex: Treating RN: Apr 22, 1932 (87 y.o. Zenaida Deed Primary Care Jonerik Sliker: 07/14/2021 Other Clinician: Referring Tametra Ahart: Treating Yida Hyams/Extender: Zenaida Deed in Treatment: 0 Active Problems Location of Pain Severity and Description of Pain Patient Has Paino Yes Site Locations Pain Location: Pain in Ulcers With Dressing Change: Yes Duration of the Pain. Constant / Intermittento Intermittent Rate the pain. Current Pain Level: 3 Least Pain Level: 0 Character of Pain Describe the Pain: Other: sore Pain Management and Medication Current Pain Management: Medication: Yes Is the Current Pain Management Adequate: Adequate How  does your wound impact your activities of daily livingo Sleep: No Bathing: No Appetite: No Relationship With Others: No Bladder Continence: No Emotions: No Bowel Continence: No Work: No Toileting: No Drive: No Dressing: No Hobbies: No Electronic Signature(s) Signed: 07/12/2021 4:49:46 PM By: 07/14/2021 RN, BSN Entered By: Mandy Adams on 07/12/2021 13:27:20 -------------------------------------------------------------------------------- Patient/Caregiver Education Details Patient Name: Date of Service: 07/14/2021 8/31/2022andnbsp1:15 PM Medical Record Number: [id number] Patient Account Number: 02/11/1932 Date of Birth/Gender: Treating RN: 01-23-1932 (87 y.o. Gildardo Cranker Primary Care Physician: Jonell Cluck Other Clinician: Referring Physician: Treating Physician/Extender: 07/14/2021,  Theresia Bough, Charles Weeks in Treatment: 0 Education Assessment Education Provided To: Patient Education Topics Provided Pressure: Handouts: Pressure Ulcers: Care and Offloading, Pressure Ulcers: Care and Offloading 2, Preventing Pressure Ulcers Methods: Explain/Verbal, Printed Responses: Reinforcements needed, State content correctly Wound/Skin Impairment: Handouts: Caring for Your Ulcer, Skin Care Do's and Dont's Methods: Explain/Verbal, Printed Responses: Reinforcements needed, State content correctly Electronic Signature(s) Signed: 07/12/2021 4:49:46 PM By: Zenaida Deed RN, BSN Entered By: Zenaida Deed on 07/12/2021 13:54:20 -------------------------------------------------------------------------------- Vitals Details Patient Name: Date of Service: Mandy Adams [facility] 07/12/2021 1:15 PM Medical Record Number: [id number] Patient Account Number: [id number] Date of Birth/Sex: Treating RN: Apr 27, 1932 (87 y.o. Tommye Standard Primary Care Brooklyn Alfredo: Gildardo Cranker Other Clinician: Referring Delorice Bannister: Treating Anshu Wehner/Extender: Jonell Cluck in Treatment: 0 Vital Signs Time Taken: 13:13 Temperature (F): 97.6 Height (in): 61 Pulse (bpm): 71 Source: Stated Respiratory Rate (breaths/min): 18 Weight (lbs): 120 Blood Pressure (mmHg): 118/71 Source: Stated Reference Range: 80 - 120 mg / dl Body Mass Index (BMI): 22.7 Electronic Signature(s) Signed: 07/12/2021 4:49:46 PM By: Zenaida Deed RN, BSN Entered By: Zenaida Deed on 07/12/2021 13:15:09

## 2021-07-12 NOTE — Progress Notes (Signed)
ROREY, HODGES ([id number]) Visit Report for 07/12/2021 Abuse/Suicide Risk Screen Details Patient Name: Date of Service: Mandy Adams [facility] 07/12/2021 1:15 PM Medical Record Number: [id number] Patient Account Number: [id number] Date of Birth/Sex: Treating RN: 05/15/1932 (87 y.o. Tommye Standard Primary Care Terron Merfeld: Gildardo Cranker Other Clinician: Referring Gerod Caligiuri: Treating Jenel Gierke/Extender: Jonell Cluck in Treatment: 0 Abuse/Suicide Risk Screen Items Answer ABUSE RISK SCREEN: Has anyone close to you tried to hurt or harm you recentlyo No Do you feel uncomfortable with anyone in your familyo No Has anyone forced you do things that you didnt want to doo No Electronic Signature(s) Signed: 07/12/2021 4:49:46 PM By: Zenaida Deed RN, BSN Entered By: Zenaida Deed on 07/12/2021 13:22:50 -------------------------------------------------------------------------------- Activities of Daily Living Details Patient Name: Date of Service: Mandy Adams 07/12/2021 1:15 PM Medical Record Number: [id number] Patient Account Number: [id number] Date of Birth/Sex: Treating RN: January 30, 1932 (87 y.o. Tommye Standard Primary Care Shaianne Nucci: Gildardo Cranker Other Clinician: Referring Joshawa Dubin: Treating Amon Costilla/Extender: Felipe Drone, Talbert Forest in Treatment: 0 Activities of Daily Living Items Answer Activities of Daily Living (Please select one for each item) Drive Automobile Not Able T Medications ake Need Assistance Use T elephone Completely Able Care for Appearance Need Assistance Use T oilet Completely Able Bath / Shower Need Assistance Dress Self Need Assistance Feed Self Completely Able Walk Need Assistance Get In / Out Bed Completely Able Housework Need Assistance Prepare Meals Need Assistance Handle Money Need Assistance Shop for Self Need Assistance Electronic Signature(s) Signed: 07/12/2021 4:49:46 PM By: Zenaida Deed RN,  BSN Entered By: Zenaida Deed on 07/12/2021 13:23:33 -------------------------------------------------------------------------------- Education Screening Details Patient Name: Date of Service: Mandy Adams [facility] 07/12/2021 1:15 PM Medical Record Number: [id number] Patient Account Number: [id number] Date of Birth/Sex: Treating RN: 1932-01-15 (87 y.o. Tommye Standard Primary Care Arlen Legendre: Gildardo Cranker Other Clinician: Referring Lorelee Mclaurin: Treating Yotam Rhine/Extender: Jonell Cluck in Treatment: 0 Primary Learner Assessed: Patient Learning Preferences/Education Level/Primary Language Learning Preference: Explanation, Demonstration, Printed Material Highest Education Level: High School Preferred Language: English Cognitive Barrier Language Barrier: No Translator Needed: No Memory Deficit: Yes Emotional Barrier: No Cultural/Religious Beliefs Affecting Medical Care: No Physical Barrier Impaired Vision: Yes Glasses Impaired Hearing: No Decreased Hand dexterity: No Knowledge/Comprehension Knowledge Level: Medium Comprehension Level: Medium Ability to understand written instructions: Medium Ability to understand verbal instructions: Medium Motivation Anxiety Level: Calm Cooperation: Cooperative Education Importance: Acknowledges Need Interest in Health Problems: Asks Questions Perception: Coherent Willingness to Engage in Self-Management Medium Activities: Readiness to Engage in Self-Management Medium Activities: Electronic Signature(s) Signed: 07/12/2021 4:49:46 PM By: Zenaida Deed RN, BSN Entered By: Zenaida Deed on 07/12/2021 13:24:24 -------------------------------------------------------------------------------- Fall Risk Assessment Details Patient Name: Date of Service: Mandy Adams [facility] 07/12/2021 1:15 PM Medical Record Number: [id number] Patient Account Number: [id number] Date of Birth/Sex: Treating RN: [birth year] (87 y.o. Tommye Standard Primary Care Freddie Dymek: Gildardo Cranker Other Clinician: Referring Rutger Salton: Treating Hardie Veltre/Extender: Jonell Cluck in Treatment: 0 Fall Risk Assessment Items Have you had 2 or more falls in the last 12 monthso 0 No Have you had any fall that resulted in injury in the last 12 monthso 0 No FALLS RISK SCREEN History of falling - immediate or within 3 months 0 No Secondary diagnosis (Do you have 2 or more medical diagnoseso) 0 No Ambulatory aid None/bed rest/wheelchair/nurse 0 No Crutches/cane/walker 15 Yes Furniture 0 No Intravenous therapy Access/Saline/Heparin Lock 0 No Gait/Transferring Normal/ bed rest/ wheelchair 0 Yes  Weak (short steps with or without shuffle, stooped but able to lift head while walking, may seek 0 No support from furniture) Impaired (short steps with shuffle, may have difficulty arising from chair, head down, impaired 0 No balance) Mental Status Oriented to own ability 0 Yes Electronic Signature(s) Signed: 07/12/2021 4:49:46 PM By: Zenaida Deed RN, BSN Entered By: Zenaida Deed on 07/12/2021 13:24:45 -------------------------------------------------------------------------------- Foot Assessment Details Patient Name: Date of Service: Mandy Adams [facility] 07/12/2021 1:15 PM Medical Record Number: [id number] Patient Account Number: [id number] Date of Birth/Sex: Treating RN: 1932/05/26 (87 y.o. Tommye Standard Primary Care Marquisha Nikolov: Gildardo Cranker Other Clinician: Referring Charda Janis: Treating Deysi Soldo/Extender: Jonell Cluck in Treatment: 0 Foot Assessment Items Site Locations + = Sensation present, - = Sensation absent, C = Callus, U = Ulcer R = Redness, W = Warmth, M = Maceration, PU = Pre-ulcerative lesion F = Fissure, S = Swelling, D = Dryness Assessment Right: Left: Other Deformity: No No Prior Foot Ulcer: No No Prior Amputation: No No Charcot Joint: No No Ambulatory Status:  Ambulatory With Help Assistance Device: Cane Gait: Steady Electronic Signature(s) Signed: 07/12/2021 4:49:46 PM By: Zenaida Deed RN, BSN Entered By: Zenaida Deed on 07/12/2021 13:26:00 -------------------------------------------------------------------------------- Nutrition Risk Screening Details Patient Name: Date of Service: Mandy Adams 07/12/2021 1:15 PM Medical Record Number: [id number] Patient Account Number: [id number] Date of Birth/Sex: Treating RN: 08/03/1932 (87 y.o. Tommye Standard Primary Care Dilara Navarrete: Gildardo Cranker Other Clinician: Referring Quaran Kedzierski: Treating Kentarius Partington/Extender: Felipe Drone, Talbert Forest in Treatment: 0 Height (in): 61 Weight (lbs): 120 Body Mass Index (BMI): 22.7 Nutrition Risk Screening Items Score Screening NUTRITION RISK SCREEN: I have an illness or condition that made me change the kind and/or amount of food I eat 0 No I eat fewer than two meals per day 0 No I eat few fruits and vegetables, or milk products 2 Yes I have three or more drinks of beer, liquor or wine almost every day 0 No I have tooth or mouth problems that make it hard for me to eat 0 No I don't always have enough money to buy the food I need 0 No I eat alone most of the time 0 No I take three or more different prescribed or over-the-counter drugs a day 1 Yes Without wanting to, I have lost or gained 10 pounds in the last six months 0 No I am not always physically able to shop, cook and/or feed myself 0 No Nutrition Protocols Good Risk Protocol Moderate Risk Protocol 0 Provide education on nutrition High Risk Proctocol Risk Level: Moderate Risk Score: 3 Electronic Signature(s) Signed: 07/12/2021 4:49:46 PM By: Zenaida Deed RN, BSN Entered By: Zenaida Deed on 07/12/2021 13:25:50

## 2021-07-12 NOTE — Progress Notes (Signed)
[name], [name] ([id number]) Visit Report for 07/12/2021 Chief Complaint Document Details Patient Name: Date of Service: Mandy Adams [facility] 07/12/2021 1:15 PM Medical Record Number: [id number] Patient Account Number: [id number] Date of Birth/Sex: Treating RN: [birth year] (87 y.o. Mandy Adams Primary Care Provider: Gildardo Adams Other Clinician: Referring Provider: Treating Provider/Extender: Mandy Adams in Treatment: 0 Information Obtained from: Patient Chief Complaint Dermatitis in the gluteal region Electronic Signature(s) Signed: 07/12/2021 1:52:28 PM By: Mandy Kelp PA-C Entered By: Mandy Adams on 07/12/2021 13:52:28 -------------------------------------------------------------------------------- HPI Details Patient Name: Date of Service: Mandy Adams [facility] 07/12/2021 1:15 PM Medical Record Number: [id number] Patient Account Number: [id number] Date of Birth/Sex: Treating RN: April 21, 1932 (87 y.o. Mandy Adams Primary Care Provider: Gildardo Adams Other Clinician: Referring Provider: Treating Provider/Extender: Mandy Adams in Treatment: 0 History of Present Illness HPI Description: 07/12/2021 upon evaluation today patient appears to be doing somewhat poorly in regard to her gluteal region. She actually has some dermatitis/skin irritation in the gluteal region. She does have some mild vascular dementia but nothing too significant it definitely does not change her overall demeanor. There is really no behavioral disturbance. Nonetheless otherwise she is a fairly healthy individual with no major medical problems. The biggest thing is she does tend to sit a lot. Her daughter is present with her today. She has a rocker chair, and a posterior chair, in a chair with a cushion out on the porch that she typically sits ischium rotating between the 3. She does not get up and move around as much as she probably should according to  what they tell me as well. Fortunately there does not appear to be any signs of active infection at this time which is great news. In fact there really does not appear to be anything truly broken down in regard to her skin this is improved significantly since they first call to make the appointment. Electronic Signature(s) Signed: 07/12/2021 5:18:30 PM By: Mandy Kelp PA-C Entered By: Mandy Adams on 07/12/2021 17:18:29 -------------------------------------------------------------------------------- Physical Exam Details Patient Name: Date of Service: Mandy Adams [facility] 07/12/2021 1:15 PM Medical Record Number: [id number] Patient Account Number: [id number] Date of Birth/Sex: Treating RN: 1931-12-08 (87 y.o. Mandy Adams Primary Care Provider: Other Clinician: Gildardo Adams Referring Provider: Treating Provider/Extender: Mandy Adams in Treatment: 0 Constitutional sitting or standing blood pressure is within target range for patient.. pulse regular and within target range for patient.Marland Kitchen respirations regular, non-labored and within target range for patient.Marland Kitchen temperature within target range for patient.. Well-nourished and well-hydrated in no acute distress. Eyes conjunctiva clear no eyelid edema noted. pupils equal round and reactive to light and accommodation. Ears, Nose, Mouth, and Throat no gross abnormality of ear auricles or external auditory canals. normal hearing noted during conversation. mucus membranes moist. Respiratory normal breathing without difficulty. Cardiovascular 2+ dorsalis pedis/posterior tibialis pulses. no clubbing, cyanosis, significant edema, <3 sec cap refill. Musculoskeletal normal gait and posture. no significant deformity or arthritic changes, no loss or range of motion, no clubbing. Psychiatric this patient is able to make decisions and demonstrates good insight into disease process. Alert and Oriented x 3. pleasant and  cooperative. Notes Upon inspection patient does not appear to be showing any signs of open wounds at this time which is great news. I do not see any evidence of active infection whatsoever either which is also good news. Fortunately I think that she is really doing quite  well. She is able to get up and walk there is no pressure injury that I see at the moment. I definitely see where there was some dermatitis/irritation in the gluteal region there is some Desitin in place I think the biggest thing is that she really needs to probably use something like AandE ointment as it is very dry in this region I think that will help protect the skin a little bit better. Outside of that she needs to make sure to get up and walk around or stand every hour on the hour in order to make sure that she does not have any ongoing issues with pressure injury here. Electronic Signature(s) Signed: 07/12/2021 5:19:47 PM By: Mandy Kelp PA-C Entered By: Mandy Adams on 07/12/2021 17:19:47 -------------------------------------------------------------------------------- Physician Orders Details Patient Name: Date of Service: Mandy Adams [facility] 07/12/2021 1:15 PM Medical Record Number: [id number] Patient Account Number: [id number] Date of Birth/Sex: Treating RN: [birth year] (87 y.o. Mandy Adams Primary Care Provider: Gildardo Adams Other Clinician: Referring Provider: Treating Provider/Extender: Mandy Adams in Treatment: 0 Verbal / Phone Orders: No Diagnosis Coding ICD-10 Coding Code Description L30.8 Other specified dermatitis F01.50 Vascular dementia without behavioral disturbance Discharge From [facility] Discharge from Wound Care Center Bathing/ Shower/ Hygiene May shower and wash wound with soap and water. Off-Loading Turn and reposition every 2 hours - stand up at least every hour during the day while awake Non Wound Condition Protect area with: - zinc paste (Desitin)  or AandD ointment to buttocks daily for skin irritation Electronic Signature(s) Signed: 07/12/2021 4:49:46 PM By: Mandy Deed RN, BSN Signed: 07/12/2021 5:21:16 PM By: Mandy Kelp PA-C Entered By: Mandy Adams on 07/12/2021 13:59:33 -------------------------------------------------------------------------------- Problem List Details Patient Name: Date of Service: Mandy Adams [facility] 07/12/2021 1:15 PM Medical Record Number: [id number] Patient Account Number: [id number] Date of Birth/Sex: Treating RN: 05/20/1932 (87 y.o. Mandy Adams Primary Care Provider: Gildardo Adams Other Clinician: Referring Provider: Treating Provider/Extender: Felipe Drone, [place] in Treatment: 0 Active Problems ICD-10 Encounter Code Description Active Date MDM Diagnosis L30.8 Other specified dermatitis 07/12/2021 No Yes F01.50 Vascular dementia without behavioral disturbance 07/12/2021 No Yes Inactive Problems Resolved Problems Electronic Signature(s) Signed: 07/12/2021 1:51:38 PM By: Mandy Kelp PA-C Entered By: Mandy Adams on 07/12/2021 13:51:37 -------------------------------------------------------------------------------- Progress Note Details Patient Name: Date of Service: Mandy Adams [facility] 07/12/2021 1:15 PM Medical Record Number: [id number] Patient Account Number: [id number] Date of Birth/Sex: Treating RN: Mar 23, 1932 (87 y.o. Mandy Adams Primary Care Provider: Gildardo Adams Other Clinician: Referring Provider: Treating Provider/Extender: Mandy Adams in Treatment: 0 Subjective Chief Complaint Information obtained from Patient Dermatitis in the gluteal region History of Present Illness (HPI) 07/12/2021 upon evaluation today patient appears to be doing somewhat poorly in regard to her gluteal region. She actually has some dermatitis/skin irritation in the gluteal region. She does have some mild vascular dementia but nothing too  significant it definitely does not change her overall demeanor. There is really no behavioral disturbance. Nonetheless otherwise she is a fairly healthy individual with no major medical problems. The biggest thing is she does tend to sit a lot. Her daughter is present with her today. She has a rocker chair, and a posterior chair, in a chair with a cushion out on the porch that she typically sits ischium rotating between the 3. She does not get up and move around as much as she probably should according to what  they tell me as well. Fortunately there does not appear to be any signs of active infection at this time which is great news. In fact there really does not appear to be anything truly broken down in regard to her skin this is improved significantly since they first call to make the appointment. Patient History Information obtained from Patient, Caregiver. Allergies No Known Allergies Family History Hypertension - Father, No family history of Cancer, Diabetes, Heart Disease, Hereditary Spherocytosis, Kidney Disease, Lung Disease, Seizures, Stroke, Thyroid Problems, Tuberculosis. Social History Never smoker, Marital Status - Widowed, Alcohol Use - Never, Drug Use - No History, Caffeine Use - Daily - coffee. Medical History Eyes Patient has history of Cataracts - bil removed Denies history of Glaucoma, Optic Neuritis Ear/Nose/Mouth/Throat Denies history of Chronic sinus problems/congestion, Middle ear problems Cardiovascular Patient has history of Hypertension Endocrine Denies history of Type I Diabetes, Type II Diabetes Integumentary (Skin) Denies history of History of Burn Neurologic Patient has history of Dementia Denies history of Seizure Disorder Oncologic Denies history of Received Chemotherapy, Received Radiation Psychiatric Denies history of Anorexia/bulimia, Confinement Anxiety Hospitalization/Surgery History - hysterectomy. Review of Systems (ROS) Constitutional  Symptoms (General Health) Denies complaints or symptoms of Fatigue, Fever, Chills, Marked Weight Change. Eyes Complains or has symptoms of Glasses / Contacts. Denies complaints or symptoms of Dry Eyes, Vision Changes. Ear/Nose/Mouth/Throat Denies complaints or symptoms of Chronic sinus problems or rhinitis. Respiratory Denies complaints or symptoms of Chronic or frequent coughs, Shortness of Breath. Cardiovascular Denies complaints or symptoms of Chest pain. Gastrointestinal Denies complaints or symptoms of Frequent diarrhea, Nausea, Vomiting. Endocrine Denies complaints or symptoms of Heat/cold intolerance. Genitourinary Denies complaints or symptoms of Frequent urination. Integumentary (Skin) Complains or has symptoms of Wounds - sacrum. Musculoskeletal Denies complaints or symptoms of Muscle Pain, Muscle Weakness. Neurologic Denies complaints or symptoms of Numbness/parasthesias. Psychiatric Denies complaints or symptoms of Claustrophobia, Suicidal. Objective Constitutional sitting or standing blood pressure is within target range for patient.. pulse regular and within target range for patient.Marland Kitchen. respirations regular, non-labored and within target range for patient.Marland Kitchen. temperature within target range for patient.. Well-nourished and well-hydrated in no acute distress. Vitals Time Taken: 1:13 PM, Height: 61 in, Source: Stated, Weight: 120 lbs, Source: Stated, BMI: 22.7, Temperature: 97.6 F, Pulse: 71 bpm, Respiratory Rate: 18 breaths/min, Blood Pressure: 118/71 mmHg. Eyes conjunctiva clear no eyelid edema noted. pupils equal round and reactive to light and accommodation. Ears, Nose, Mouth, and Throat no gross abnormality of ear auricles or external auditory canals. normal hearing noted during conversation. mucus membranes moist. Respiratory normal breathing without difficulty. Cardiovascular 2+ dorsalis pedis/posterior tibialis pulses. no clubbing, cyanosis, significant  edema, Musculoskeletal normal gait and posture. no significant deformity or arthritic changes, no loss or range of motion, no clubbing. Psychiatric this patient is able to make decisions and demonstrates good insight into disease process. Alert and Oriented x 3. pleasant and cooperative. General Notes: Upon inspection patient does not appear to be showing any signs of open wounds at this time which is great news. I do not see any evidence of active infection whatsoever either which is also good news. Fortunately I think that she is really doing quite well. She is able to get up and walk there is no pressure injury that I see at the moment. I definitely see where there was some dermatitis/irritation in the gluteal region there is some Desitin in place I think the biggest thing is that she really needs to probably use something like AandE ointment as  it is very dry in this region I think that will help protect the skin a little bit better. Outside of that she needs to make sure to get up and walk around or stand every hour on the hour in order to make sure that she does not have any ongoing issues with pressure injury here. Assessment Active Problems ICD-10 Other specified dermatitis Vascular dementia without behavioral disturbance Plan Discharge From [facility] Services: Discharge from Wound Care Center Bathing/ Shower/ Hygiene: May shower and wash wound with soap and water. Off-Loading: Turn and reposition every 2 hours - stand up at least every hour during the day while awake Non Wound Condition: Protect area with: - zinc paste (Desitin) or AandD ointment to buttocks daily for skin irritation 1. Would recommend currently that we go ahead and initiate just a consult only for today since the patient has nothing open I think that is actually appropriate. With that being said I am going to recommend AandE ointment for the time being if she has just dry skin and irritation like she does right now. She  develops any moisture areas then Desitin would be better. 2. With regard to offloading I think she should get up and either stand or walk for about 5 minutes every hour in order to get circulation in the gluteal region and prevent any pressure breakdown. She voiced understanding her daughter did as well. We did do quite a bit of teaching during the visit today I am very happy however that there were no actual wounds open at this point. We will see the patient back for follow-up visit as needed. Electronic Signature(s) Signed: 07/12/2021 5:20:45 PM By: Mandy Kelp PA-C Entered By: Mandy Adams on 07/12/2021 17:20:45 -------------------------------------------------------------------------------- HxROS Details Patient Name: Date of Service: Mandy Adams [facility] 07/12/2021 1:15 PM Medical Record Number: [id number] Patient Account Number: [id number] Date of Birth/Sex: Treating RN: [birth year] (87 y.o. Mandy Adams Primary Care Provider: Gildardo Adams Other Clinician: Referring Provider: Treating Provider/Extender: Mandy Adams in Treatment: 0 Information Obtained From Patient Caregiver Constitutional Symptoms (General Health) Complaints and Symptoms: Negative for: Fatigue; Fever; Chills; Marked Weight Change Eyes Complaints and Symptoms: Positive for: Glasses / Contacts Negative for: Dry Eyes; Vision Changes Medical History: Positive for: Cataracts - bil removed Negative for: Glaucoma; Optic Neuritis Ear/Nose/Mouth/Throat Complaints and Symptoms: Negative for: Chronic sinus problems or rhinitis Medical History: Negative for: Chronic sinus problems/congestion; Middle ear problems Respiratory Complaints and Symptoms: Negative for: Chronic or frequent coughs; Shortness of Breath Cardiovascular Complaints and Symptoms: Negative for: Chest pain Medical History: Positive for: Hypertension Gastrointestinal Complaints and Symptoms: Negative for:  Frequent diarrhea; Nausea; Vomiting Endocrine Complaints and Symptoms: Negative for: Heat/cold intolerance Medical History: Negative for: Type I Diabetes; Type II Diabetes Genitourinary Complaints and Symptoms: Negative for: Frequent urination Integumentary (Skin) Complaints and Symptoms: Positive for: Wounds - sacrum Medical History: Negative for: History of Burn Musculoskeletal Complaints and Symptoms: Negative for: Muscle Pain; Muscle Weakness Neurologic Complaints and Symptoms: Negative for: Numbness/parasthesias Medical History: Positive for: Dementia Negative for: Seizure Disorder Psychiatric Complaints and Symptoms: Negative for: Claustrophobia; Suicidal Medical History: Negative for: Anorexia/bulimia; Confinement Anxiety Hematologic/Lymphatic Immunological Oncologic Medical History: Negative for: Received Chemotherapy; Received Radiation HBO Extended History Items Eyes: Cataracts Immunizations Pneumococcal Vaccine: Received Pneumococcal Vaccination: No Implantable Devices None Hospitalization / Surgery History Type of Hospitalization/Surgery hysterectomy Family and Social History Cancer: No; Diabetes: No; Heart Disease: No; Hereditary Spherocytosis: No; Hypertension: Yes - Father; Kidney Disease: No; Lung Disease: No;  Seizures: No; Stroke: No; Thyroid Problems: No; Tuberculosis: No; Never smoker; Marital Status - Widowed; Alcohol Use: Never; Drug Use: No History; Caffeine Use: Daily - coffee; Financial Concerns: No; Food, Clothing or Shelter Needs: No; Support System Lacking: No; Transportation Concerns: No Electronic Signature(s) Signed: 07/12/2021 4:49:46 PM By: Mandy Deed RN, BSN Signed: 07/12/2021 5:21:16 PM By: Mandy Kelp PA-C Entered By: Mandy Adams on 07/12/2021 13:22:28 -------------------------------------------------------------------------------- SuperBill Details Patient Name: Date of Service: Mandy Adams [facility]  07/12/2021 Medical Record Number: [id number] Patient Account Number: [id number] Date of Birth/Sex: Treating RN: [birth year] (87 y.o. Mandy Adams Primary Care Provider: Gildardo Adams Other Clinician: Referring Provider: Treating Provider/Extender: Mandy Adams in Treatment: 0 Diagnosis Coding ICD-10 Codes Code Description L30.8 Other specified dermatitis F01.50 Vascular dementia without behavioral disturbance Facility Procedures CPT4 Code: [id number] Description: [phone] - WOUND CARE VISIT-LEV 3 EST PT Modifier: Quantity: 1 Physician Procedures : CPT4 Code Description Modifier [id number] WC PHYS LEVEL 3 NEW PT ICD-10 Diagnosis Description L30.8 Other specified dermatitis F01.50 Vascular dementia without behavioral disturbance Quantity: 1 Electronic Signature(s) Signed: 07/12/2021 5:20:58 PM By: Mandy Kelp PA-C Previous Signature: 07/12/2021 4:49:46 PM Version By: Mandy Deed RN, BSN Entered By: Mandy Adams on 07/12/2021 17:20:57

## 2022-03-08 ENCOUNTER — Telehealth: Payer: Self-pay | Admitting: Adult Health

## 2022-03-08 NOTE — Telephone Encounter (Signed)
Son (randy) is going to be sending secure email of pt forms through his cone email address to [email or web address].  ?Randy's email: [email or web address] ?Would like a call back if this is a problem. ?

## 2022-03-08 NOTE — Telephone Encounter (Signed)
Pt's son called stating that he has a form that is needing to be filled out regarding the pt's Competence. Son would like to know if she has to be seen for the form to be filled out or is the decision made based on the last OV. Please advise. ?

## 2022-03-09 NOTE — Telephone Encounter (Signed)
Noted, thank you

## 2022-03-09 NOTE — Telephone Encounter (Signed)
Called back pt son Shimika Ames), stated he has already sent the email but he will now be faxing forms over as well.  ?

## 2022-03-10 ENCOUNTER — Other Ambulatory Visit: Payer: Self-pay | Admitting: Neurology

## 2022-03-12 NOTE — Telephone Encounter (Signed)
Received forms from patient's son. Patient has follow up on 04/25/22.  ?

## 2022-04-05 ENCOUNTER — Ambulatory Visit: Payer: Medicare Other | Admitting: Adult Health

## 2022-04-25 ENCOUNTER — Ambulatory Visit: Payer: Medicare Other | Admitting: Adult Health

## 2022-05-08 NOTE — Progress Notes (Signed)
[facility] [address] [place]. [place] [address] [phone]       OFFICE FOLLOW UP NOTE  [name] Date of Birth:  02/15/1932 Medical Record Number:  [id number]   Referring MD: Elon Alas Reason for Referral: Memory loss  Chief Complaint  Patient presents with   Follow-up    Rm 3 with son Mandy Adams  Pt is well and stable, no new concerns      HPI:    Update 05/09/2022 JM: Patient returns for yearly follow-up regarding memory loss likely related to Alzheimer's dementia.  Reports cognition has been stable.  MMSE today 18/30 (prior 21/30). Does have good days/bad days but overall stable. Continues to live alone. Home instead agency aides Mon-Fri during the day and family is with her during the weekend. Continues ADLs independently. Daughter handles finances, aides/family assist with cooking and cleaning.  Ambulates with a cane.  No behavioral concerns. Remains on Namenda 10 mg twice daily, denies side effects. Family has been looking into estate planning, current home is a split-level residence which concerns family regarding patient's safety, they have toured a couple independent living facilities. Lawyer office requesting capacity form to be completed as she has a diagnosis of dementia.  No further concerns at this time.     History provided for reference purposes only Update 04/03/2021 JM: Mandy Adams returns for 6 months follow-up accompanied by her son Mandy Adams.  She has been doing well since prior visit.  Her cognition has been stable and she remains living independently with aide assistance 5 days weekly for 4 hours as well as very supportive son and daughter checking on her routinely.  She remains on Namenda 10 mg twice daily tolerating without side effects.  No behavioral concerns.  No concerns at this time  Update 10/03/2020 JM: Mandy Adams returns for 86-month follow-up accompanied by her son, Mandy Adams, after prior initial consult visit with Dr. Pearlean Brownie on  07/20/2020 for memory loss concerns. MRI, EEG and dementia panel largely unremarkable. Initiated Namenda 10 mg twice daily after prior visit which she has tolerated well without side effects. Since prior visit, both patient and son report stable cognition.  MMSE today 21/30 (prior 20/30).  She lives independently able to maintain ADLs independently but does have home agency with aide assistance M-F to assist with IADLs.  No behavioral concerns.  No further concerns at this time.  Initial consult visit 07/20/2020 Dr. Pearlean Brownie: Mandy Adams is a pleasant 88 year old Caucasian lady seen today for initial office consultation visit for memory loss.  She is accompanied by her daughter.  History is obtained from them, review of referral notes and electronic medical records.  No imaging films available for review today.  She has a past medical history of hyperlipidemia, hypertension, eczema, back pain, dizziness, esophagitis who has noticed cognitive decline with memory loss, confusion and disorientation for the last several months.  This was brought on by recent urine tract infection in July 2021 which has been treated but her deficits have persisted.  Patient has apparently not had any previous cognitive eval.  She is currently living at home with family has brought in a caregiver to help 4 to 5 hours a day.  Patient has not been driving for years and her daughter does have finances.  She can ambulate independently but recently has started using a cane.  Her balance has not been as good but she has had no falls or injuries.  The daughter denies any delusions or hallucinations, unsafe  behavior.  Patient does get at times disoriented and confused but can be redirected easily.  She has not tried any medications like Aricept or any other over-the-counter memory pills.  She has no prior history of strokes, TIAs, migraines, seizures, significant head injury with loss of consciousness.  There is no family history of dementia.  The  family was concerned because she had not been eating well and in fact they found an ulcer at her bottom indicating that she had not been ambulating and moving around as much as she should have been.    ROS:   14 system review of systems is positive for those listed in HPI and all other systems negative    PMH:  Past Medical History:  Diagnosis Date   AKI (acute kidney injury) (HCC) 04/30/2018   Community acquired pneumonia 04/30/2018   Leukocytosis 04/30/2018    Social History:  Social History   Socioeconomic History   Marital status: Married    Spouse name: Not on file   Number of children: Not on file   Years of education: Not on file   Highest education level: Not on file  Occupational History   Not on file  Tobacco Use   Smoking status: Never   Smokeless tobacco: Never  Vaping Use   Vaping Use: Never used  Substance and Sexual Activity   Alcohol use: Never   Drug use: Never   Sexual activity: Not on file  Other Topics Concern   Not on file  Social History Narrative   Not on file   Social Determinants of Health   Financial Resource Strain: Not on file  Food Insecurity: Not on file  Transportation Needs: Not on file  Physical Activity: Not on file  Stress: Not on file  Social Connections: Not on file  Intimate Partner Violence: Not on file    Medications:   Current Outpatient Medications on File Prior to Visit  Medication Sig Dispense Refill   diclofenac Sodium (VOLTAREN) 1 % GEL Apply 2 g topically 4 (four) times daily as needed (pain).     estradiol (ESTRACE) 0.1 MG/GM vaginal cream Place 1 Applicatorful vaginally 3 (three) times a week.     memantine (NAMENDA) 10 MG tablet TAKE 1 TABLET BY MOUTH TWICE A DAY 180 tablet 2   pantoprazole (PROTONIX) 40 MG tablet Take 40 mg by mouth every other day.     TOPROL XL 25 MG 24 hr tablet Take 25 mg by mouth daily.     No current facility-administered medications on file prior to visit.    Allergies:  No Known  Allergies  Physical  Today's Vitals   05/09/22 0727  BP: (!) 152/96  Pulse: 69  Weight: 119 lb (54 kg)  Height: 5' (1.524 m)   Body mass index is 23.24 kg/m.  General: Petite very pleasant elderly Caucasian lady seated, in no evident distress Head: head normocephalic and atraumatic.   Neck: supple with no carotid or supraclavicular bruits Cardiovascular: regular rate and rhythm, no murmurs Musculoskeletal: Severe kyphoscoliosis. Skin:  no rash/petichiae Vascular:  Normal pulses all extremities  Neurologic Exam Mental Status: Awake and fully alert. Oriented to place and self but disoriented to time. Recent memory diminished and remote memory intact.  Attention span, concentration and fund of knowledge diminished. Mood and affect appropriate.      05/09/2022    7:33 AM 04/03/2021    2:29 PM 10/03/2020    3:02 PM  MMSE - Mini Mental State Exam  Orientation to time 1 1 4   Orientation to Place 4 4 5   Registration 3 3 3   Attention/ Calculation 3 5 1   Recall 0 0 0  Language- name 2 objects 1 2 2   Language- repeat 1 1 1   Language- follow 3 step command 3 3 3   Language- read & follow direction 1 1 1   Write a sentence 1 1 1   Copy design 0 0 0  Copy design-comments   6 animals  Total score 18 21 21    Cranial Nerves: Pupils equal, briskly reactive to light. Extraocular movements full without nystagmus. Visual fields full to confrontation.  HOH bilaterally.  Facial sensation intact. Face, tongue, palate moves normally and symmetrically.  Motor: Normal bulk and tone. Normal strength in all tested extremity muscles. Sensory.: intact to touch , pinprick , position and vibratory sensation.  Coordination: Rapid alternating movements normal in all extremities. Finger-to-nose and heel-to-shin performed accurately bilaterally. Gait and Station: Arises from chair without difficulty. Stance is slightly stooped. Gait demonstrates normal stride length and balance with use of a cane to walk.   Tandem walking not tested. Reflexes: 1+ and symmetric. Toes downgoing.       ASSESSMENT/PLAN: Ms. Adams is a very pleasant 88 year old Caucasian lady with subacute memory and cognitive decline likely due to Alzheimer's dementia.  Evaluation for reversible causes unremarkable including MRI, EEG and dementia panel.  Subjectively, cognition overall stable although can fluctuate.  MMSE today 18/30 (prior 21/30).  No behavioral concerns    Continue Namenda 10 mg twice daily -refill provided Advised use of cane at all times and assistance for IADLs as indicated Will further discuss capacity letter with Dr. - son is aware a follow up visit may need to be scheduled with him to obtain letter as these can only be obtained by a MD    Follow-up in 1 year or call earlier if needed   CC:  [facility]: Dr. , , MD   I spent 31 minutes of face-to-face and non-face-to-face time with patient and son.  This included previsit chart review, lab review, study review, order entry, electronic health record documentation, patient and son education and discussion regarding Alzheimer's dementia with review and discussion of MMSE, current medication usage and continued use and answered all the questions to patient and son satisfaction   , AGNP-BC  [facility] [address] [place], [place] [address]  Phone [phone] Fax [phone] Note: This document was prepared with digital dictation and possible smart phrase technology. Any transcriptional errors that result from this process are unintentional.

## 2022-05-09 ENCOUNTER — Ambulatory Visit (INDEPENDENT_AMBULATORY_CARE_PROVIDER_SITE_OTHER): Payer: Medicare Other | Admitting: Adult Health

## 2022-05-09 ENCOUNTER — Encounter: Payer: Self-pay | Admitting: Adult Health

## 2022-05-09 VITALS — BP 152/96 | HR 69 | Ht 60.0 in | Wt 119.0 lb

## 2022-05-09 DIAGNOSIS — G301 Alzheimer's disease with late onset: Secondary | ICD-10-CM

## 2022-05-09 DIAGNOSIS — F0280 Dementia in other diseases classified elsewhere without behavioral disturbance: Secondary | ICD-10-CM

## 2022-05-09 MED ORDER — MEMANTINE HCL 10 MG PO TABS: 10.0000 mg | ORAL_TABLET | Freq: Two times a day (BID) | ORAL | 3 refills | Status: AC

## 2022-05-09 NOTE — Patient Instructions (Signed)
Continue Namenda 10mg  twice daily - refill provided     Follow up in 1 year or call earlier if needed

## 2022-05-15 NOTE — Progress Notes (Signed)
I agree with the above plan 

## 2022-05-23 ENCOUNTER — Ambulatory Visit: Payer: Medicare Other | Admitting: Adult Health

## 2022-06-05 ENCOUNTER — Telehealth: Payer: Self-pay | Admitting: Adult Health

## 2022-06-05 NOTE — Telephone Encounter (Signed)
Please contact Harvie Heck, patients son, regarding prior question of patient's capacity. Per Dr. Pearlean Brownie, he believes with MMSE of 18 and being on Namenda for years, she likely lacks capacity. We can document this in a formal letter if he requests so. Thank you.

## 2022-06-05 NOTE — Telephone Encounter (Signed)
I called pt's son Harvie Heck and left a vm asking for a call back.   If pt's son returns call TE staff can relay message to the pt and see if letter is needed.

## 2022-08-14 ENCOUNTER — Encounter: Payer: Self-pay | Admitting: Adult Health

## 2023-04-24 ENCOUNTER — Encounter (HOSPITAL_COMMUNITY): Payer: Self-pay

## 2023-04-24 ENCOUNTER — Emergency Department (HOSPITAL_COMMUNITY): Payer: Medicare Other

## 2023-04-24 ENCOUNTER — Other Ambulatory Visit: Payer: Self-pay

## 2023-04-24 ENCOUNTER — Emergency Department (HOSPITAL_COMMUNITY)
Admission: EM | Admit: 2023-04-24 | Discharge: 2023-04-24 | Disposition: A | Discharge status: Home / Self Care | Payer: Medicare Other | Attending: Emergency Medicine | Admitting: Emergency Medicine

## 2023-04-24 DIAGNOSIS — R7989 Other specified abnormal findings of blood chemistry: Secondary | ICD-10-CM | POA: Diagnosis not present

## 2023-04-24 DIAGNOSIS — R5383 Other fatigue: Secondary | ICD-10-CM | POA: Insufficient documentation

## 2023-04-24 DIAGNOSIS — R55 Syncope and collapse: Secondary | ICD-10-CM | POA: Insufficient documentation

## 2023-04-24 DIAGNOSIS — M25572 Pain in left ankle and joints of left foot: Secondary | ICD-10-CM | POA: Diagnosis present

## 2023-04-24 DIAGNOSIS — I493 Ventricular premature depolarization: Secondary | ICD-10-CM | POA: Diagnosis not present

## 2023-04-24 DIAGNOSIS — R5381 Other malaise: Secondary | ICD-10-CM | POA: Insufficient documentation

## 2023-04-24 DIAGNOSIS — M7989 Other specified soft tissue disorders: Secondary | ICD-10-CM | POA: Insufficient documentation

## 2023-04-24 HISTORY — DX: Unspecified dementia, unspecified severity, without behavioral disturbance, psychotic disturbance, mood disturbance, and anxiety: F03.90

## 2023-04-24 HISTORY — DX: Hyperlipidemia, unspecified: E78.5

## 2023-04-24 HISTORY — DX: Essential (primary) hypertension: I10

## 2023-04-24 LAB — CBC WITH DIFFERENTIAL/PLATELET
Abs Immature Granulocytes: 0.08 10*3/uL — ABNORMAL HIGH (ref 0.00–0.07)
Basophils Absolute: 0.1 10*3/uL (ref 0.0–0.1)
Basophils Relative: 1 %
Eosinophils Absolute: 0.4 10*3/uL (ref 0.0–0.5)
Eosinophils Relative: 3 %
HCT: 37 % (ref 36.0–46.0)
Hemoglobin: 11.4 g/dL — ABNORMAL LOW (ref 12.0–15.0)
Immature Granulocytes: 1 %
Lymphocytes Relative: 24 %
Lymphs Abs: 2.4 10*3/uL (ref 0.7–4.0)
MCH: 27.8 pg (ref 26.0–34.0)
MCHC: 30.8 g/dL (ref 30.0–36.0)
MCV: 90.2 fL (ref 80.0–100.0)
Monocytes Absolute: 0.6 10*3/uL (ref 0.1–1.0)
Monocytes Relative: 6 %
Neutro Abs: 6.7 10*3/uL (ref 1.7–7.7)
Neutrophils Relative %: 65 %
Platelets: 255 10*3/uL (ref 150–400)
RBC: 4.1 MIL/uL (ref 3.87–5.11)
RDW: 14.9 % (ref 11.5–15.5)
WBC: 10.2 10*3/uL (ref 4.0–10.5)
nRBC: 0 % (ref 0.0–0.2)

## 2023-04-24 LAB — BASIC METABOLIC PANEL
Anion gap: 9 (ref 5–15)
BUN: 28 mg/dL — ABNORMAL HIGH (ref 8–23)
CO2: 25 mmol/L (ref 22–32)
Calcium: 8.8 mg/dL — ABNORMAL LOW (ref 8.9–10.3)
Chloride: 106 mmol/L (ref 98–111)
Creatinine, Ser: 1.46 mg/dL — ABNORMAL HIGH (ref 0.44–1.00)
GFR, Estimated: 34 mL/min — ABNORMAL LOW (ref >60)
Glucose, Bld: 124 mg/dL — ABNORMAL HIGH (ref 70–99)
Potassium: 4 mmol/L (ref 3.5–5.1)
Sodium: 140 mmol/L (ref 135–145)

## 2023-04-24 LAB — RAPID URINE DRUG SCREEN, HOSP PERFORMED
Amphetamines: NOT DETECTED
Barbiturates: NOT DETECTED
Benzodiazepines: NOT DETECTED
Cocaine: NOT DETECTED
Opiates: NOT DETECTED
Tetrahydrocannabinol: NOT DETECTED

## 2023-04-24 LAB — TROPONIN I (HIGH SENSITIVITY): Troponin I (High Sensitivity): 5 ng/L (ref <18)

## 2023-04-24 MED ORDER — LACTATED RINGERS IV BOLUS
500.0000 mL | Freq: Once | INTRAVENOUS | Status: AC
  Administered 2023-04-24: 500 mL via INTRAVENOUS

## 2023-04-24 NOTE — ED Provider Notes (Signed)
[place] EMERGENCY DEPARTMENT AT [facility] Provider Note   CSN: [id number] Arrival date & time: 04/24/23  1623     History Chief Complaint  Patient presents with   Ankle Pain    HPI Damarys Speir is a 89 y.o. female presenting for multiple complaints.  Per triage note she is only here for ankle pain, however per patient she had a episode of syncope today at her nursing facility and has  had malaise and fatigue for 5 days. Denies fevers chills, nausea vomiting, chest pain or shortness of breath. Later in the evaluation, patient's son arrived and states that she has had the syncopal episodes for a while and is not concerned about those.  They state that the only reason she is here is that she had a positive D-dimer and needs a leg ultrasound.  Patient's recorded medical, surgical, social, medication list and allergies were reviewed in the Snapshot window as part of the initial history.   Review of Systems   Review of Systems  Constitutional:  Negative for chills and fever.  HENT:  Negative for ear pain and sore throat.   Eyes:  Negative for pain and visual disturbance.  Respiratory:  Negative for cough and shortness of breath.   Cardiovascular:  Negative for chest pain and palpitations.  Gastrointestinal:  Negative for abdominal pain and vomiting.  Genitourinary:  Negative for dysuria and hematuria.  Musculoskeletal:  Negative for arthralgias and back pain.  Skin:  Negative for color change and rash.  Neurological:  Positive for syncope. Negative for seizures.  All other systems reviewed and are negative.   Physical Exam Updated Vital Signs BP 106/60   Pulse 67   Temp 98.4 F (36.9 C) (Oral)   Resp 19   Ht 5' (1.524 m)   Wt 54 kg   SpO2 100%   BMI 23.25 kg/m  Physical Exam Vitals and nursing note reviewed.  Constitutional:      General: She is not in acute distress.    Appearance: She is well-developed.  HENT:     Head: Normocephalic and  atraumatic.  Eyes:     Conjunctiva/sclera: Conjunctivae normal.  Cardiovascular:     Rate and Rhythm: Normal rate and regular rhythm.     Heart sounds: No murmur heard. Pulmonary:     Effort: Pulmonary effort is normal. No respiratory distress.     Breath sounds: Normal breath sounds.  Abdominal:     General: There is no distension.     Palpations: Abdomen is soft.     Tenderness: There is no abdominal tenderness. There is no right CVA tenderness or left CVA tenderness.  Musculoskeletal:        General: No swelling or tenderness. Normal range of motion.     Cervical back: Neck supple.  Skin:    General: Skin is warm and dry.  Neurological:     General: No focal deficit present.     Mental Status: She is alert and oriented to person, place, and time. Mental status is at baseline.     Cranial Nerves: No cranial nerve deficit.      ED Course/ Medical Decision Making/ A&P    Procedures Ultrasound ED DVT  Date/Time: 04/24/2023 10:33 PM  Performed by: Glyn Ade, MD Authorized by: Glyn Ade, MD   Procedure details:    Indications: lower extremity pain and swelling of limb     Assessment for:  DVT   Images Archived: Yes     Limitations:  None LLE Findings:    Left common femoral vein:  Compressible   Left deep and superficial femoral veins:  Compressible   Left popliteal vein:  Compressible IMPRESSION:   DVT:     None    Medications Ordered in ED Medications  lactated ringers bolus 500 mL (0 mLs Intravenous Stopped 04/24/23 2210)   Medical Decision Making:   Talanda Ferriell is a 89 y.o. female who presented to the ED today with a syncopal episode detailed above.    Handoff received from EMS.  Additional history discussed with patient's family/caregivers.  Patient placed on continuous vitals and telemetry monitoring while in ED which was reviewed periodically.  Complete initial physical exam performed, notably the patient  was HDS in NAD.    Reviewed  and confirmed nursing documentation for past medical history, family history, social history.    Initial Assessment:   With the patient's presentation of syncope, most likely diagnosis is orthostatic hypotension vs vasovagal episode. Other diagnoses were considered including (but not limited to) arrythmogenic syncope, valvular abnormality, PE, aortic dissection. These are considered less likely due to history of present illness and physical exam findings.   This is most consistent with an acute life/limb threatening illness complicated by underlying chronic conditions. Additionally, patient's history appears more consistent with benign episodes including orthostatic event vs  vagal episode based on poor p.o. intake at her facility. Initial Plan:  Personally performed a point-of-care ultrasound to evaluate for DVT.  Very low suspicion based on physical exam.  She has bilateral lower extremity edema and left greater than right ankle swelling that resolved with positional placement while in the emergency room.  Formal ultrasound studies not available at this time at night, however point-of-care grossly negative and physical exam not consistent therefore low pretest probability and is nonnecessary for patient to return for formal study without extensive change in her physical exam findings. Screening labs including CBC and Metabolic panel to evaluate for infectious or metabolic etiology of disease.  UDS ordered for potential evaluation for altered mental status reported by EMS.  Per family she has returned to her baseline. CXR to evaluate for structural/infectious intrathoracic pathology.  Troponin/EKG to evaluate for cardiac pathology. \Objective evaluation as below reviewed after administration of IVF/Telemetry monitoring  Initial Study Results:   Laboratory  All laboratory results reviewed without evidence of clinically relevant pathology.     EKG EKG was reviewed independently. Rate, rhythm, axis,  intervals all examined and without medically relevant abnormality. ST segments without concerns for elevations.    Radiology:  All images reviewed independently. Agree with radiology report at this time.   DG Chest Portable 1 View  Result Date: 04/24/2023 CLINICAL DATA:  Left ankle pain and swelling. EXAM: PORTABLE CHEST 1 VIEW COMPARISON:  05/30/2020 FINDINGS: Cardiac enlargement. No vascular congestion or edema. There is a large hernia involving the esophageal hiatus and/or left hemidiaphragm. The hernia contains multiple bowel structures, likely including stomach, colon, and small bowel. No change since prior study. Associated elevation of the left hemidiaphragm. No focal consolidation. No pleural effusions. No pneumothorax. Calcification of the aorta. 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 No change. No evidence of active pulmonary disease. Electronically Signed   By: Burman Nieves M.D.   On: 04/24/2023 17:23   DG Ankle Complete Left  Result Date: 04/24/2023 CLINICAL DATA:  Left ankle pain and swelling. EXAM: LEFT ANKLE COMPLETE - 3+ VIEW COMPARISON:  None Available. FINDINGS: Moderate diffuse soft tissue  swelling. The ankle mortise is symmetric and intact. Mild anterior tibiotalar joint space narrowing on lateral view. No acute fracture or dislocation. IMPRESSION: Moderate diffuse soft tissue swelling. No acute fracture. Electronically Signed   By: Neita Garnet M.D.   On: 04/24/2023 17:17      Final Assessment and Plan:   Objective findings grossly reassuring.  Symptoms all resolved.  Patient feels better after 500 cc IV fluid.  Slight prerenal AKI informed the family at bedside of these findings. They agree with oral rehydration plan to follow-up with primary care provider in outpatient setting for reassessment and repeat labs.  Disposition:  I have considered need for hospitalization, however, considering all of the above, I  believe this patient is stable for discharge at this time.  Patient/family educated about specific return precautions for given chief complaint and symptoms.  Patient/family educated about follow-up with PCP .     Patient/family expressed understanding of return precautions and need for follow-up. Patient spoken to regarding all imaging and laboratory results and appropriate follow up for these results. All education provided in verbal form with additional information in written form. Time was allowed for answering of patient questions. Patient discharged.    Emergency Department Medication Summary:   Medications  lactated ringers bolus 500 mL (0 mLs Intravenous Stopped 04/24/23 2210)         Clinical Impression:  1. Leg swelling      Discharge   Final Clinical Impression(s) / ED Diagnoses Final diagnoses:  Leg swelling    Rx / DC Orders ED Discharge Orders     None         Glyn Ade, MD 04/24/23 2237

## 2023-04-24 NOTE — ED Notes (Signed)
Pt resting in bed with no acute distress noted at this time.  

## 2023-04-24 NOTE — Discharge Instructions (Signed)
Mandy Adams was seen tonight for leg swelling.  She was also evaluated for described syncopal episodes.  Fortunately objective her evaluation is grossly reassuring.  I performed a point-of-care ultrasound that did not demonstrate any DVT.  I have a very low pretest probability for a DVT based on her exam she has had resolution of her ankle edema which is likely dependent edema. If she has interval worsening over the next 72 hours, she may need to return for a formal study during business hours.

## 2023-04-24 NOTE — ED Triage Notes (Signed)
Pt BIB EMS from [facility] c/o left ankle swelling. Pt denies pain

## 2023-05-14 ENCOUNTER — Ambulatory Visit: Payer: Medicare Other | Admitting: Adult Health

## 2023-05-20 ENCOUNTER — Ambulatory Visit: Payer: Medicare Other | Admitting: Adult Health

## 2024-01-01 ENCOUNTER — Emergency Department (HOSPITAL_COMMUNITY)
Admission: EM | Admit: 2024-01-01 | Discharge: 2024-01-02 | Disposition: A | Discharge status: Home / Self Care | Payer: Medicare Other | Attending: Student | Admitting: Student

## 2024-01-01 ENCOUNTER — Other Ambulatory Visit: Payer: Self-pay

## 2024-01-01 ENCOUNTER — Encounter (HOSPITAL_COMMUNITY): Payer: Self-pay

## 2024-01-01 DIAGNOSIS — R42 Dizziness and giddiness: Secondary | ICD-10-CM | POA: Insufficient documentation

## 2024-01-01 DIAGNOSIS — J101 Influenza due to other identified influenza virus with other respiratory manifestations: Secondary | ICD-10-CM | POA: Diagnosis not present

## 2024-01-01 DIAGNOSIS — R531 Weakness: Secondary | ICD-10-CM | POA: Insufficient documentation

## 2024-01-01 DIAGNOSIS — R059 Cough, unspecified: Secondary | ICD-10-CM | POA: Diagnosis present

## 2024-01-01 NOTE — ED Triage Notes (Signed)
 Pt recently dx with the flu. Pt has increased dizziness and weakness  A&Ox2  149 cbg 84 hr 120/65 98% RA  Hx dementia

## 2024-01-01 NOTE — ED Provider Notes (Signed)
 [place] EMERGENCY DEPARTMENT AT [facility] Provider Note   CSN: [id number] Arrival date & time: 01/01/24  2318     History {Add pertinent medical, surgical, social history, OB history to HPI:1} Chief Complaint  Patient presents with   Dizziness   Weakness    Mandy Adams is a 89 y.o. female who presents emergency department for weakness.  Patient states that she was recently diagnosed with the flu.  She lives at home alone.  She has bodyaches chills cough.  She is feeling very weak when she stands.  She denies nausea vomiting or diarrhea.  She has had poor appetite.  She denies room spinning dizziness or balance issues.   Dizziness Associated symptoms: weakness   Weakness Associated symptoms: dizziness        Home Medications Prior to Admission medications   Medication Sig Start Date End Date Taking? Authorizing Provider  CALMOSEPTINE 0.44-20.6 % OINT SMARTSIG:1 Liberally Topical Twice Daily 04/15/23   [provider]  diclofenac Sodium (VOLTAREN) 1 % GEL Apply 2 g topically 4 (four) times daily as needed (pain).    [provider]  estradiol (ESTRACE) 0.1 MG/GM vaginal cream Place 1 Applicatorful vaginally 3 (three) times a week.    [provider]  FLONASE ALLERGY RELIEF 50 MCG/ACT nasal spray Place 1 spray into both nostrils daily. 03/18/23   [provider]  memantine (NAMENDA) 10 MG tablet Take 1 tablet (10 mg total) by mouth 2 (two) times daily. 05/09/22   Ihor Austin, NP  mupirocin ointment (BACTROBAN) 2 % Apply 1 Application topically 3 (three) times daily. 04/15/23   [provider]  pantoprazole (PROTONIX) 40 MG tablet Take 40 mg by mouth every other day. 02/04/18   [provider]  sertraline (ZOLOFT) 50 MG tablet Take 50 mg by mouth daily. 04/24/23   [provider]  TOPROL XL 25 MG 24 hr tablet Take 25 mg by mouth daily. 02/04/18   [provider]      Allergies    Patient has no  known allergies.    Review of Systems   Review of Systems  Neurological:  Positive for dizziness and weakness.    Physical Exam Updated Vital Signs BP 130/73   Pulse 75   Temp 98.9 F (37.2 C) (Oral)   Resp 18   SpO2 96%  Physical Exam Vitals and nursing note reviewed.  Constitutional:      General: She is not in acute distress.    Appearance: She is well-developed. She is not diaphoretic.  HENT:     Head: Normocephalic and atraumatic.     Right Ear: External ear normal.     Left Ear: External ear normal.     Nose: Nose normal.     Mouth/Throat:     Mouth: Mucous membranes are moist.  Eyes:     General: No scleral icterus.    Conjunctiva/sclera: Conjunctivae normal.  Cardiovascular:     Rate and Rhythm: Normal rate and regular rhythm.     Heart sounds: Normal heart sounds. No murmur heard.    No friction rub. No gallop.  Pulmonary:     Effort: Pulmonary effort is normal. No respiratory distress.     Breath sounds: Examination of the right-upper field reveals rhonchi. Examination of the left-upper field reveals rhonchi. Examination of the right-middle field reveals rhonchi. Examination of the left-middle field reveals rhonchi. Examination of the right-lower field reveals rhonchi. Examination of the left-lower field reveals rhonchi. Rhonchi present. No  wheezing.  Abdominal:     General: Bowel sounds are normal. There is no distension.     Palpations: Abdomen is soft. There is no mass.     Tenderness: There is no abdominal tenderness. There is no guarding.  Musculoskeletal:     Cervical back: Normal range of motion.  Skin:    General: Skin is warm and dry.  Neurological:     Mental Status: She is alert and oriented to person, place, and time.  Psychiatric:        Behavior: Behavior normal.     ED Results / Procedures / Treatments   Labs (all labs ordered are listed, but only abnormal results are displayed) Labs Reviewed  CBC WITH DIFFERENTIAL/PLATELET   COMPREHENSIVE METABOLIC PANEL  URINALYSIS, ROUTINE W REFLEX MICROSCOPIC    EKG None  Radiology No results found.  Procedures Procedures  {Document cardiac monitor, telemetry assessment procedure when appropriate:1}  Medications Ordered in ED Medications - No data to display  ED Course/ Medical Decision Making/ A&P   {   Click here for ABCD2, HEART and other calculatorsREFRESH Note before signing :1}                              Medical Decision Making Amount and/or Complexity of Data Reviewed Labs: ordered. Radiology: ordered.   ***  {Document critical care time when appropriate:1} {Document review of labs and clinical decision tools ie heart score, Chads2Vasc2 etc:1}  {Document your independent review of radiology images, and any outside records:1} {Document your discussion with family members, caretakers, and with consultants:1} {Document social determinants of health affecting pt's care:1} {Document your decision making why or why not admission, treatments were needed:1} Final Clinical Impression(s) / ED Diagnoses Final diagnoses:  None    Rx / DC Orders ED Discharge Orders     None

## 2024-01-02 ENCOUNTER — Other Ambulatory Visit (HOSPITAL_COMMUNITY): Payer: Medicare Other

## 2024-01-02 ENCOUNTER — Emergency Department (HOSPITAL_COMMUNITY): Payer: Medicare Other

## 2024-01-02 LAB — COMPREHENSIVE METABOLIC PANEL
ALT: 11 U/L (ref 0–44)
AST: 17 U/L (ref 15–41)
Albumin: 3.1 g/dL — ABNORMAL LOW (ref 3.5–5.0)
Alkaline Phosphatase: 55 U/L (ref 38–126)
Anion gap: 8 (ref 5–15)
BUN: 17 mg/dL (ref 8–23)
CO2: 24 mmol/L (ref 22–32)
Calcium: 8.4 mg/dL — ABNORMAL LOW (ref 8.9–10.3)
Chloride: 101 mmol/L (ref 98–111)
Creatinine, Ser: 1.24 mg/dL — ABNORMAL HIGH (ref 0.44–1.00)
GFR, Estimated: 42 mL/min — ABNORMAL LOW (ref >60)
Glucose, Bld: 103 mg/dL — ABNORMAL HIGH (ref 70–99)
Potassium: 3.4 mmol/L — ABNORMAL LOW (ref 3.5–5.1)
Sodium: 133 mmol/L — ABNORMAL LOW (ref 135–145)
Total Bilirubin: 0.5 mg/dL (ref 0.0–1.2)
Total Protein: 6.5 g/dL (ref 6.5–8.1)

## 2024-01-02 LAB — CBC WITH DIFFERENTIAL/PLATELET
Abs Immature Granulocytes: 0.06 10*3/uL (ref 0.00–0.07)
Basophils Absolute: 0.1 10*3/uL (ref 0.0–0.1)
Basophils Relative: 1 %
Eosinophils Absolute: 0.1 10*3/uL (ref 0.0–0.5)
Eosinophils Relative: 1 %
HCT: 35.5 % — ABNORMAL LOW (ref 36.0–46.0)
Hemoglobin: 11.4 g/dL — ABNORMAL LOW (ref 12.0–15.0)
Immature Granulocytes: 1 %
Lymphocytes Relative: 20 %
Lymphs Abs: 1.7 10*3/uL (ref 0.7–4.0)
MCH: 27.3 pg (ref 26.0–34.0)
MCHC: 32.1 g/dL (ref 30.0–36.0)
MCV: 85.1 fL (ref 80.0–100.0)
Monocytes Absolute: 0.7 10*3/uL (ref 0.1–1.0)
Monocytes Relative: 8 %
Neutro Abs: 5.8 10*3/uL (ref 1.7–7.7)
Neutrophils Relative %: 69 %
Platelets: 170 10*3/uL (ref 150–400)
RBC: 4.17 MIL/uL (ref 3.87–5.11)
RDW: 15.3 % (ref 11.5–15.5)
WBC: 8.4 10*3/uL (ref 4.0–10.5)
nRBC: 0 % (ref 0.0–0.2)

## 2024-01-02 NOTE — Discharge Instructions (Addendum)
 Contact a health care provider if: You get new symptoms. You have chest pain. You have watery poop, also called diarrhea. You have a fever. Your cough gets worse. You start to have more mucus. You feel like you may vomit, or you vomit. Get help right away if: You become short of breath or have trouble breathing. Your skin or nails turn blue. You have very bad pain or stiffness in your neck. You get a sudden headache or pain in your face or ear. You vomit each time you eat or drink. These symptoms may be an emergency. Call 911 right away. Do not wait to see if the symptoms will go away. Do not drive yourself to the hospital.

## 2024-01-02 NOTE — ED Notes (Addendum)
 Pt ambulated to bathroom with walker. Oximetry stayed ay 96. On the way back pt reported too weak to walk back to room.

## 2024-08-03 ENCOUNTER — Inpatient Hospital Stay (HOSPITAL_COMMUNITY)

## 2024-08-03 ENCOUNTER — Encounter (HOSPITAL_COMMUNITY): Payer: Self-pay

## 2024-08-03 ENCOUNTER — Inpatient Hospital Stay (HOSPITAL_COMMUNITY): Admitting: Certified Registered Nurse Anesthetist

## 2024-08-03 ENCOUNTER — Emergency Department (HOSPITAL_COMMUNITY)

## 2024-08-03 ENCOUNTER — Other Ambulatory Visit: Payer: Self-pay

## 2024-08-03 ENCOUNTER — Encounter (HOSPITAL_COMMUNITY)
Admission: EM | Disposition: A | Discharge status: Skilled Nursing Facility | Payer: Self-pay | Source: Skilled Nursing Facility | Attending: Internal Medicine

## 2024-08-03 ENCOUNTER — Inpatient Hospital Stay (HOSPITAL_COMMUNITY)
Admission: EM | Admit: 2024-08-03 | Discharge: 2024-08-06 | DRG: 522 | Disposition: A | Discharge status: Skilled Nursing Facility | Source: Skilled Nursing Facility | Attending: Internal Medicine | Admitting: Internal Medicine

## 2024-08-03 DIAGNOSIS — E872 Acidosis, unspecified: Secondary | ICD-10-CM | POA: Diagnosis present

## 2024-08-03 DIAGNOSIS — E441 Mild protein-calorie malnutrition: Secondary | ICD-10-CM | POA: Diagnosis present

## 2024-08-03 DIAGNOSIS — Z8701 Personal history of pneumonia (recurrent): Secondary | ICD-10-CM | POA: Diagnosis not present

## 2024-08-03 DIAGNOSIS — I10 Essential (primary) hypertension: Secondary | ICD-10-CM | POA: Diagnosis present

## 2024-08-03 DIAGNOSIS — L309 Dermatitis, unspecified: Secondary | ICD-10-CM | POA: Insufficient documentation

## 2024-08-03 DIAGNOSIS — F4320 Adjustment disorder, unspecified: Secondary | ICD-10-CM | POA: Diagnosis present

## 2024-08-03 DIAGNOSIS — Z79899 Other long term (current) drug therapy: Secondary | ICD-10-CM | POA: Diagnosis not present

## 2024-08-03 DIAGNOSIS — F03B4 Unspecified dementia, moderate, with anxiety: Secondary | ICD-10-CM | POA: Diagnosis present

## 2024-08-03 DIAGNOSIS — F03B0 Unspecified dementia, moderate, without behavioral disturbance, psychotic disturbance, mood disturbance, and anxiety: Secondary | ICD-10-CM | POA: Diagnosis present

## 2024-08-03 DIAGNOSIS — N179 Acute kidney failure, unspecified: Secondary | ICD-10-CM | POA: Diagnosis present

## 2024-08-03 DIAGNOSIS — K449 Diaphragmatic hernia without obstruction or gangrene: Secondary | ICD-10-CM | POA: Diagnosis present

## 2024-08-03 DIAGNOSIS — Y92129 Unspecified place in nursing home as the place of occurrence of the external cause: Secondary | ICD-10-CM

## 2024-08-03 DIAGNOSIS — N1831 Chronic kidney disease, stage 3a: Secondary | ICD-10-CM | POA: Diagnosis present

## 2024-08-03 DIAGNOSIS — Z515 Encounter for palliative care: Secondary | ICD-10-CM | POA: Diagnosis not present

## 2024-08-03 DIAGNOSIS — M1990 Unspecified osteoarthritis, unspecified site: Secondary | ICD-10-CM | POA: Insufficient documentation

## 2024-08-03 DIAGNOSIS — D62 Acute posthemorrhagic anemia: Secondary | ICD-10-CM | POA: Diagnosis not present

## 2024-08-03 DIAGNOSIS — Z90710 Acquired absence of both cervix and uterus: Secondary | ICD-10-CM | POA: Diagnosis not present

## 2024-08-03 DIAGNOSIS — I959 Hypotension, unspecified: Secondary | ICD-10-CM | POA: Diagnosis present

## 2024-08-03 DIAGNOSIS — Z6823 Body mass index (BMI) 23.0-23.9, adult: Secondary | ICD-10-CM

## 2024-08-03 DIAGNOSIS — E785 Hyperlipidemia, unspecified: Secondary | ICD-10-CM | POA: Diagnosis present

## 2024-08-03 DIAGNOSIS — F0390 Unspecified dementia without behavioral disturbance: Secondary | ICD-10-CM | POA: Diagnosis not present

## 2024-08-03 DIAGNOSIS — R5381 Other malaise: Secondary | ICD-10-CM | POA: Diagnosis not present

## 2024-08-03 DIAGNOSIS — L89302 Pressure ulcer of unspecified buttock, stage 2: Secondary | ICD-10-CM | POA: Insufficient documentation

## 2024-08-03 DIAGNOSIS — M4856XA Collapsed vertebra, not elsewhere classified, lumbar region, initial encounter for fracture: Secondary | ICD-10-CM | POA: Diagnosis present

## 2024-08-03 DIAGNOSIS — W19XXXA Unspecified fall, initial encounter: Secondary | ICD-10-CM | POA: Diagnosis present

## 2024-08-03 DIAGNOSIS — K219 Gastro-esophageal reflux disease without esophagitis: Secondary | ICD-10-CM | POA: Diagnosis present

## 2024-08-03 DIAGNOSIS — N183 Chronic kidney disease, stage 3 unspecified: Secondary | ICD-10-CM | POA: Diagnosis present

## 2024-08-03 DIAGNOSIS — S72002A Fracture of unspecified part of neck of left femur, initial encounter for closed fracture: Secondary | ICD-10-CM

## 2024-08-03 DIAGNOSIS — D72829 Elevated white blood cell count, unspecified: Secondary | ICD-10-CM | POA: Diagnosis present

## 2024-08-03 DIAGNOSIS — E538 Deficiency of other specified B group vitamins: Secondary | ICD-10-CM | POA: Diagnosis present

## 2024-08-03 DIAGNOSIS — I129 Hypertensive chronic kidney disease with stage 1 through stage 4 chronic kidney disease, or unspecified chronic kidney disease: Secondary | ICD-10-CM | POA: Diagnosis present

## 2024-08-03 DIAGNOSIS — E861 Hypovolemia: Secondary | ICD-10-CM | POA: Diagnosis not present

## 2024-08-03 DIAGNOSIS — Z66 Do not resuscitate: Secondary | ICD-10-CM | POA: Diagnosis present

## 2024-08-03 DIAGNOSIS — R638 Other symptoms and signs concerning food and fluid intake: Secondary | ICD-10-CM | POA: Diagnosis not present

## 2024-08-03 DIAGNOSIS — Z7982 Long term (current) use of aspirin: Secondary | ICD-10-CM

## 2024-08-03 DIAGNOSIS — Z8249 Family history of ischemic heart disease and other diseases of the circulatory system: Secondary | ICD-10-CM | POA: Diagnosis not present

## 2024-08-03 DIAGNOSIS — I2609 Other pulmonary embolism with acute cor pulmonale: Secondary | ICD-10-CM | POA: Diagnosis not present

## 2024-08-03 DIAGNOSIS — S72012A Unspecified intracapsular fracture of left femur, initial encounter for closed fracture: Principal | ICD-10-CM | POA: Diagnosis present

## 2024-08-03 DIAGNOSIS — I700 Atherosclerosis of aorta: Secondary | ICD-10-CM | POA: Diagnosis present

## 2024-08-03 DIAGNOSIS — R52 Pain, unspecified: Secondary | ICD-10-CM | POA: Diagnosis not present

## 2024-08-03 DIAGNOSIS — J90 Pleural effusion, not elsewhere classified: Secondary | ICD-10-CM | POA: Diagnosis not present

## 2024-08-03 DIAGNOSIS — I2699 Other pulmonary embolism without acute cor pulmonale: Secondary | ICD-10-CM | POA: Diagnosis not present

## 2024-08-03 DIAGNOSIS — N189 Chronic kidney disease, unspecified: Secondary | ICD-10-CM | POA: Diagnosis not present

## 2024-08-03 DIAGNOSIS — E7800 Pure hypercholesterolemia, unspecified: Secondary | ICD-10-CM | POA: Insufficient documentation

## 2024-08-03 DIAGNOSIS — Z972 Presence of dental prosthetic device (complete) (partial): Secondary | ICD-10-CM

## 2024-08-03 DIAGNOSIS — D649 Anemia, unspecified: Secondary | ICD-10-CM | POA: Diagnosis not present

## 2024-08-03 DIAGNOSIS — Z7189 Other specified counseling: Secondary | ICD-10-CM | POA: Diagnosis not present

## 2024-08-03 DIAGNOSIS — J9601 Acute respiratory failure with hypoxia: Secondary | ICD-10-CM | POA: Diagnosis not present

## 2024-08-03 DIAGNOSIS — N952 Postmenopausal atrophic vaginitis: Secondary | ICD-10-CM | POA: Insufficient documentation

## 2024-08-03 HISTORY — PX: TOTAL HIP ARTHROPLASTY: SHX124

## 2024-08-03 LAB — CBC
HCT: 35.3 % — ABNORMAL LOW (ref 36.0–46.0)
HCT: 30 % — ABNORMAL LOW (ref 36.0–46.0)
Hemoglobin: 10.8 g/dL — ABNORMAL LOW (ref 12.0–15.0)
Hemoglobin: 8.9 g/dL — ABNORMAL LOW (ref 12.0–15.0)
MCH: 26.5 pg (ref 26.0–34.0)
MCH: 26.3 pg (ref 26.0–34.0)
MCHC: 30.6 g/dL (ref 30.0–36.0)
MCHC: 29.7 g/dL — ABNORMAL LOW (ref 30.0–36.0)
MCV: 86.5 fL (ref 80.0–100.0)
MCV: 88.5 fL (ref 80.0–100.0)
Platelets: 327 K/uL (ref 150–400)
Platelets: 197 K/uL (ref 150–400)
RBC: 4.08 MIL/uL (ref 3.87–5.11)
RBC: 3.39 MIL/uL — ABNORMAL LOW (ref 3.87–5.11)
RDW: 17 % — ABNORMAL HIGH (ref 11.5–15.5)
RDW: 16.9 % — ABNORMAL HIGH (ref 11.5–15.5)
WBC: 19.1 K/uL — ABNORMAL HIGH (ref 4.0–10.5)
WBC: 15.5 K/uL — ABNORMAL HIGH (ref 4.0–10.5)
nRBC: 0 % (ref 0.0–0.2)
nRBC: 0 % (ref 0.0–0.2)

## 2024-08-03 LAB — COMPREHENSIVE METABOLIC PANEL WITH GFR
ALT: 11 U/L (ref 0–44)
AST: 27 U/L (ref 15–41)
Albumin: 3.4 g/dL — ABNORMAL LOW (ref 3.5–5.0)
Alkaline Phosphatase: 75 U/L (ref 38–126)
Anion gap: 15 (ref 5–15)
BUN: 30 mg/dL — ABNORMAL HIGH (ref 8–23)
CO2: 18 mmol/L — ABNORMAL LOW (ref 22–32)
Calcium: 8.3 mg/dL — ABNORMAL LOW (ref 8.9–10.3)
Chloride: 108 mmol/L (ref 98–111)
Creatinine, Ser: 1.23 mg/dL — ABNORMAL HIGH (ref 0.44–1.00)
GFR, Estimated: 42 mL/min — ABNORMAL LOW (ref >60)
Glucose, Bld: 102 mg/dL — ABNORMAL HIGH (ref 70–99)
Potassium: 4.1 mmol/L (ref 3.5–5.1)
Sodium: 141 mmol/L (ref 135–145)
Total Bilirubin: 0.5 mg/dL (ref 0.0–1.2)
Total Protein: 6 g/dL — ABNORMAL LOW (ref 6.5–8.1)

## 2024-08-03 LAB — BASIC METABOLIC PANEL WITH GFR
Anion gap: 13 (ref 5–15)
BUN: 31 mg/dL — ABNORMAL HIGH (ref 8–23)
CO2: 20 mmol/L — ABNORMAL LOW (ref 22–32)
Calcium: 8.4 mg/dL — ABNORMAL LOW (ref 8.9–10.3)
Chloride: 104 mmol/L (ref 98–111)
Creatinine, Ser: 1.5 mg/dL — ABNORMAL HIGH (ref 0.44–1.00)
GFR, Estimated: 33 mL/min — ABNORMAL LOW (ref >60)
Glucose, Bld: 138 mg/dL — ABNORMAL HIGH (ref 70–99)
Potassium: 4.8 mmol/L (ref 3.5–5.1)
Sodium: 137 mmol/L (ref 135–145)

## 2024-08-03 LAB — ABO/RH: ABO/RH(D): O POS

## 2024-08-03 LAB — MAGNESIUM: Magnesium: 2.1 mg/dL (ref 1.7–2.4)

## 2024-08-03 LAB — LIPASE, BLOOD: Lipase: 14 U/L (ref 11–51)

## 2024-08-03 LAB — I-STAT CG4 LACTIC ACID, ED: Lactic Acid, Venous: 2.2 mmol/L (ref 0.5–1.9)

## 2024-08-03 LAB — CBG MONITORING, ED: Glucose-Capillary: 105 mg/dL — ABNORMAL HIGH (ref 70–99)

## 2024-08-03 LAB — CK: Total CK: 63 U/L (ref 38–234)

## 2024-08-03 LAB — LACTIC ACID, PLASMA: Lactic Acid, Venous: 1.7 mmol/L (ref 0.5–1.9)

## 2024-08-03 SURGERY — ARTHROPLASTY, HIP, TOTAL, ANTERIOR APPROACH
Anesthesia: General | Site: Hip | Laterality: Left

## 2024-08-03 MED ORDER — TRANEXAMIC ACID 1000 MG/10ML IV SOLN
2000.0000 mg | INTRAVENOUS | Status: DC
  Filled 2024-08-03: qty 20

## 2024-08-03 MED ORDER — ALUM & MAG HYDROXIDE-SIMETH 200-200-20 MG/5ML PO SUSP: 30.0000 mL | ORAL | Status: DC | PRN

## 2024-08-03 MED ORDER — ACETAMINOPHEN 650 MG RE SUPP: 650.0000 mg | Freq: Four times a day (QID) | RECTAL | Status: DC | PRN

## 2024-08-03 MED ORDER — ACETAMINOPHEN 10 MG/ML IV SOLN
INTRAVENOUS | Status: AC
  Filled 2024-08-03: qty 100

## 2024-08-03 MED ORDER — ROCURONIUM BROMIDE 10 MG/ML (PF) SYRINGE
PREFILLED_SYRINGE | INTRAVENOUS | Status: DC | PRN
  Administered 2024-08-03: 50 mg via INTRAVENOUS

## 2024-08-03 MED ORDER — MEMANTINE HCL 5 MG PO TABS
5.0000 mg | ORAL_TABLET | Freq: Every day | ORAL | Status: DC
Start: 2024-08-03 — End: 2024-08-06
  Administered 2024-08-03 – 2024-08-05 (×3): 5 mg via ORAL
  Filled 2024-08-03 (×3): qty 1

## 2024-08-03 MED ORDER — SORBITOL 70 % SOLN: 30.0000 mL | Freq: Every day | Status: DC | PRN

## 2024-08-03 MED ORDER — SODIUM CHLORIDE 0.9 % IV SOLN: INTRAVENOUS | Status: DC

## 2024-08-03 MED ORDER — POLYETHYLENE GLYCOL 3350 17 G PO PACK: 17.0000 g | PACK | Freq: Every day | ORAL | Status: DC | PRN

## 2024-08-03 MED ORDER — ONDANSETRON HCL 4 MG/2ML IJ SOLN
4.0000 mg | Freq: Four times a day (QID) | INTRAMUSCULAR | Status: DC | PRN
  Filled 2024-08-03 (×2): qty 2

## 2024-08-03 MED ORDER — BUPIVACAINE-MELOXICAM ER 200-6 MG/7ML IJ SOLN
INTRAMUSCULAR | Status: AC
  Filled 2024-08-03: qty 1

## 2024-08-03 MED ORDER — MORPHINE SULFATE (PF) 2 MG/ML IV SOLN: 0.5000 mg | INTRAVENOUS | Status: DC | PRN

## 2024-08-03 MED ORDER — HYDROCODONE-ACETAMINOPHEN 7.5-325 MG PO TABS
1.0000 | ORAL_TABLET | Freq: Every day | ORAL | Status: DC | PRN
  Administered 2024-08-05: 1 via ORAL
  Filled 2024-08-03: qty 1

## 2024-08-03 MED ORDER — TRANEXAMIC ACID-NACL 1000-0.7 MG/100ML-% IV SOLN
1000.0000 mg | Freq: Once | INTRAVENOUS | Status: AC
Start: 2024-08-03 — End: 2024-08-03
  Administered 2024-08-03: 1000 mg via INTRAVENOUS
  Filled 2024-08-03: qty 100

## 2024-08-03 MED ORDER — FENTANYL CITRATE PF 50 MCG/ML IJ SOSY
50.0000 ug | PREFILLED_SYRINGE | Freq: Once | INTRAMUSCULAR | Status: AC
  Administered 2024-08-03: 50 ug via INTRAVENOUS
  Filled 2024-08-03: qty 1

## 2024-08-03 MED ORDER — LIDOCAINE 2% (20 MG/ML) 5 ML SYRINGE
INTRAMUSCULAR | Status: DC | PRN
  Administered 2024-08-03: 60 mg via INTRAVENOUS

## 2024-08-03 MED ORDER — ASPIRIN 325 MG PO TBEC
325.0000 mg | DELAYED_RELEASE_TABLET | Freq: Two times a day (BID) | ORAL | Status: DC
  Administered 2024-08-04 – 2024-08-06 (×5): 325 mg via ORAL
  Filled 2024-08-03 (×5): qty 1

## 2024-08-03 MED ORDER — FENTANYL CITRATE (PF) 250 MCG/5ML IJ SOLN
INTRAMUSCULAR | Status: DC | PRN
  Administered 2024-08-03: 25 ug via INTRAVENOUS
  Administered 2024-08-03: 50 ug via INTRAVENOUS
  Administered 2024-08-03: 25 ug via INTRAVENOUS

## 2024-08-03 MED ORDER — HYDROMORPHONE HCL 1 MG/ML IJ SOLN
INTRAMUSCULAR | Status: DC | PRN
  Administered 2024-08-03 (×2): .5 mg via INTRAVENOUS

## 2024-08-03 MED ORDER — OXYCODONE-ACETAMINOPHEN 5-325 MG PO TABS
1.0000 | ORAL_TABLET | Freq: Once | ORAL | Status: AC
  Administered 2024-08-03: 1 via ORAL
  Filled 2024-08-03: qty 1

## 2024-08-03 MED ORDER — MECLIZINE HCL 25 MG PO TABS: 25.0000 mg | ORAL_TABLET | Freq: Three times a day (TID) | ORAL | Status: DC | PRN

## 2024-08-03 MED ORDER — METHOCARBAMOL 500 MG PO TABS: 500.0000 mg | ORAL_TABLET | Freq: Three times a day (TID) | ORAL | Status: DC | PRN

## 2024-08-03 MED ORDER — CEFAZOLIN SODIUM-DEXTROSE 1-4 GM/50ML-% IV SOLN
1.0000 g | Freq: Three times a day (TID) | INTRAVENOUS | Status: DC
  Administered 2024-08-03 – 2024-08-04 (×2): 1 g via INTRAVENOUS
  Filled 2024-08-03 (×2): qty 50

## 2024-08-03 MED ORDER — ORAL CARE MOUTH RINSE: 15.0000 mL | OROMUCOSAL | Status: DC | PRN

## 2024-08-03 MED ORDER — PHENYLEPHRINE 80 MCG/ML (10ML) SYRINGE FOR IV PUSH (FOR BLOOD PRESSURE SUPPORT)
PREFILLED_SYRINGE | INTRAVENOUS | Status: DC | PRN
  Administered 2024-08-03 (×2): 80 ug via INTRAVENOUS

## 2024-08-03 MED ORDER — MAGNESIUM CITRATE PO SOLN: 1.0000 | Freq: Once | ORAL | Status: DC | PRN

## 2024-08-03 MED ORDER — FENTANYL CITRATE (PF) 100 MCG/2ML IJ SOLN
INTRAMUSCULAR | Status: AC
  Filled 2024-08-03: qty 2

## 2024-08-03 MED ORDER — LACTATED RINGERS IV SOLN: INTRAVENOUS | Status: DC

## 2024-08-03 MED ORDER — CEFAZOLIN SODIUM-DEXTROSE 2-4 GM/100ML-% IV SOLN
2.0000 g | INTRAVENOUS | Status: AC
  Administered 2024-08-03: 2 g via INTRAVENOUS
  Filled 2024-08-03: qty 100

## 2024-08-03 MED ORDER — ONDANSETRON HCL 4 MG PO TABS
4.0000 mg | ORAL_TABLET | Freq: Four times a day (QID) | ORAL | Status: DC | PRN
  Administered 2024-08-04: 4 mg via ORAL
  Filled 2024-08-03: qty 1

## 2024-08-03 MED ORDER — CHLORHEXIDINE GLUCONATE 0.12 % MT SOLN
15.0000 mL | Freq: Once | OROMUCOSAL | Status: AC
  Administered 2024-08-03: 15 mL via OROMUCOSAL

## 2024-08-03 MED ORDER — PANTOPRAZOLE SODIUM 20 MG PO TBEC
20.0000 mg | DELAYED_RELEASE_TABLET | Freq: Every day | ORAL | Status: DC
  Administered 2024-08-03 – 2024-08-06 (×4): 20 mg via ORAL
  Filled 2024-08-03 (×4): qty 1

## 2024-08-03 MED ORDER — DOCUSATE SODIUM 100 MG PO CAPS
100.0000 mg | ORAL_CAPSULE | Freq: Two times a day (BID) | ORAL | Status: DC
  Administered 2024-08-03 – 2024-08-06 (×6): 100 mg via ORAL
  Filled 2024-08-03 (×6): qty 1

## 2024-08-03 MED ORDER — ENSURE PLUS HIGH PROTEIN PO LIQD
237.0000 mL | Freq: Two times a day (BID) | ORAL | Status: DC
  Administered 2024-08-04 – 2024-08-06 (×5): 237 mL via ORAL

## 2024-08-03 MED ORDER — ASPIRIN 325 MG PO TBEC: 325.0000 mg | DELAYED_RELEASE_TABLET | Freq: Two times a day (BID) | ORAL | 0 refills | Status: DC

## 2024-08-03 MED ORDER — BUPIVACAINE-MELOXICAM ER 200-6 MG/7ML IJ SOLN
INTRAMUSCULAR | Status: DC | PRN
  Administered 2024-08-03: 7 mL

## 2024-08-03 MED ORDER — ACETAMINOPHEN 325 MG PO TABS
325.0000 mg | ORAL_TABLET | Freq: Four times a day (QID) | ORAL | Status: DC | PRN
  Administered 2024-08-04 – 2024-08-06 (×3): 650 mg via ORAL
  Filled 2024-08-03 (×3): qty 2

## 2024-08-03 MED ORDER — FENTANYL CITRATE PF 50 MCG/ML IJ SOSY
50.0000 ug | PREFILLED_SYRINGE | INTRAMUSCULAR | Status: DC | PRN
Start: 2024-08-03 — End: 2024-08-03

## 2024-08-03 MED ORDER — HYDROCODONE-ACETAMINOPHEN 5-325 MG PO TABS: 1.0000 | ORAL_TABLET | Freq: Four times a day (QID) | ORAL | 0 refills | Status: DC | PRN

## 2024-08-03 MED ORDER — ISOPROPYL ALCOHOL 70 % SOLN
Status: DC | PRN
  Administered 2024-08-03: 1 via TOPICAL

## 2024-08-03 MED ORDER — ALBUMIN HUMAN 5 % IV SOLN: INTRAVENOUS | Status: DC | PRN

## 2024-08-03 MED ORDER — HYDROMORPHONE HCL 2 MG/ML IJ SOLN
INTRAMUSCULAR | Status: AC
  Filled 2024-08-03: qty 1

## 2024-08-03 MED ORDER — ACETAMINOPHEN 10 MG/ML IV SOLN
INTRAVENOUS | Status: DC | PRN
  Administered 2024-08-03: 1000 mg via INTRAVENOUS

## 2024-08-03 MED ORDER — SODIUM CHLORIDE 0.9 % IV BOLUS
1000.0000 mL | Freq: Once | INTRAVENOUS | Status: AC
Start: 2024-08-03 — End: 2024-08-03
  Administered 2024-08-03: 1000 mL via INTRAVENOUS

## 2024-08-03 MED ORDER — MENTHOL 3 MG MT LOZG: 1.0000 | LOZENGE | OROMUCOSAL | Status: DC | PRN

## 2024-08-03 MED ORDER — METHOCARBAMOL 1000 MG/10ML IJ SOLN: 500.0000 mg | Freq: Three times a day (TID) | INTRAMUSCULAR | Status: DC | PRN

## 2024-08-03 MED ORDER — SERTRALINE HCL 50 MG PO TABS
50.0000 mg | ORAL_TABLET | Freq: Every day | ORAL | Status: DC
  Administered 2024-08-03 – 2024-08-06 (×4): 50 mg via ORAL
  Filled 2024-08-03 (×4): qty 1

## 2024-08-03 MED ORDER — TRANEXAMIC ACID-NACL 1000-0.7 MG/100ML-% IV SOLN
1000.0000 mg | INTRAVENOUS | Status: AC
  Administered 2024-08-03: 1000 mg via INTRAVENOUS
  Filled 2024-08-03: qty 100

## 2024-08-03 MED ORDER — PROPOFOL 10 MG/ML IV BOLUS
INTRAVENOUS | Status: DC | PRN
  Administered 2024-08-03: 50 mg via INTRAVENOUS

## 2024-08-03 MED ORDER — ONDANSETRON HCL 4 MG/2ML IJ SOLN: 4.0000 mg | Freq: Four times a day (QID) | INTRAMUSCULAR | Status: DC | PRN

## 2024-08-03 MED ORDER — HYDROCODONE-ACETAMINOPHEN 5-325 MG PO TABS
1.0000 | ORAL_TABLET | Freq: Two times a day (BID) | ORAL | Status: DC | PRN
  Administered 2024-08-04 – 2024-08-05 (×4): 1 via ORAL
  Filled 2024-08-03 (×5): qty 1

## 2024-08-03 MED ORDER — 0.9 % SODIUM CHLORIDE (POUR BTL) OPTIME
TOPICAL | Status: DC | PRN
  Administered 2024-08-03: 1000 mL

## 2024-08-03 MED ORDER — VANCOMYCIN HCL 1000 MG IV SOLR
INTRAVENOUS | Status: AC
  Filled 2024-08-03: qty 20

## 2024-08-03 MED ORDER — PHENOL 1.4 % MT LIQD: 1.0000 | OROMUCOSAL | Status: DC | PRN

## 2024-08-03 MED ORDER — VANCOMYCIN HCL 1 G IV SOLR
INTRAVENOUS | Status: DC | PRN
  Administered 2024-08-03: 1000 mg via TOPICAL

## 2024-08-03 MED ORDER — ACETAMINOPHEN 325 MG PO TABS: 650.0000 mg | ORAL_TABLET | Freq: Four times a day (QID) | ORAL | Status: DC | PRN

## 2024-08-03 MED ORDER — MIRTAZAPINE 15 MG PO TABS
15.0000 mg | ORAL_TABLET | Freq: Every day | ORAL | Status: DC
  Administered 2024-08-03 – 2024-08-05 (×3): 15 mg via ORAL
  Filled 2024-08-03 (×3): qty 1

## 2024-08-03 MED ORDER — ONDANSETRON HCL 4 MG/2ML IJ SOLN
INTRAMUSCULAR | Status: DC | PRN
  Administered 2024-08-03: 4 mg via INTRAVENOUS

## 2024-08-03 MED ORDER — OXYCODONE HCL 5 MG PO TABS: 2.5000 mg | ORAL_TABLET | ORAL | Status: DC | PRN

## 2024-08-03 MED ORDER — FENTANYL CITRATE PF 50 MCG/ML IJ SOSY: 25.0000 ug | PREFILLED_SYRINGE | INTRAMUSCULAR | Status: DC | PRN

## 2024-08-03 MED ORDER — TRANEXAMIC ACID 1000 MG/10ML IV SOLN
INTRAVENOUS | Status: DC | PRN
  Administered 2024-08-03: 2000 mg via TOPICAL

## 2024-08-03 MED ORDER — HYDROMORPHONE HCL 1 MG/ML IJ SOLN: 0.5000 mg | INTRAMUSCULAR | Status: DC | PRN

## 2024-08-03 MED ORDER — SODIUM CHLORIDE 0.9 % IR SOLN
Status: DC | PRN
  Administered 2024-08-03: 3000 mL

## 2024-08-03 MED ORDER — SUGAMMADEX SODIUM 200 MG/2ML IV SOLN
INTRAVENOUS | Status: DC | PRN
  Administered 2024-08-03: 120 mg via INTRAVENOUS

## 2024-08-03 MED ORDER — PHENYLEPHRINE HCL-NACL 20-0.9 MG/250ML-% IV SOLN
INTRAVENOUS | Status: DC | PRN
  Administered 2024-08-03: 25 ug/min via INTRAVENOUS

## 2024-08-03 MED ORDER — ACETAMINOPHEN 500 MG PO TABS
500.0000 mg | ORAL_TABLET | Freq: Four times a day (QID) | ORAL | Status: AC
Start: 2024-08-03 — End: 2024-08-04
  Administered 2024-08-03 – 2024-08-04 (×2): 500 mg via ORAL
  Filled 2024-08-03 (×3): qty 1

## 2024-08-03 MED ORDER — PROPOFOL 10 MG/ML IV BOLUS
INTRAVENOUS | Status: AC
  Filled 2024-08-03: qty 20

## 2024-08-03 MED ORDER — CEFAZOLIN SODIUM-DEXTROSE 2-3 GM-%(50ML) IV SOLR: INTRAVENOUS | Status: DC | PRN

## 2024-08-03 MED ORDER — POVIDONE-IODINE 10 % EX SWAB: 2.0000 | Freq: Once | CUTANEOUS | Status: DC

## 2024-08-03 MED ORDER — ONDANSETRON HCL 4 MG PO TABS: 4.0000 mg | ORAL_TABLET | Freq: Four times a day (QID) | ORAL | Status: DC | PRN

## 2024-08-03 SURGICAL SUPPLY — 50 items
BAG COUNTER SPONGE SURGICOUNT (BAG) IMPLANT
BAG DECANTER FOR FLEXI CONT (MISCELLANEOUS) IMPLANT
BAG ZIPLOCK 12X15 (MISCELLANEOUS) ×1 IMPLANT
BLADE SAG 18X100X1.27 (BLADE) ×1 IMPLANT
CEMENT BONE DEPUY (Cement) IMPLANT
CEMENTRALIZER 8.5MM (Orthopedic Implant) IMPLANT
CLSR STERI-STRIP ANTIMIC 1/2X4 (GAUZE/BANDAGES/DRESSINGS) IMPLANT
COVER PERINEAL POST (MISCELLANEOUS) ×1 IMPLANT
COVER SURGICAL LIGHT HANDLE (MISCELLANEOUS) ×1 IMPLANT
DERMABOND ADVANCED .7 DNX12 (GAUZE/BANDAGES/DRESSINGS) IMPLANT
DRAPE FOOT SWITCH (DRAPES) IMPLANT
DRAPE IMP U-DRAPE 54X76 (DRAPES) ×1 IMPLANT
DRAPE POUCH INSTRU U-SHP 10X18 (DRAPES) ×1 IMPLANT
DRAPE STERI IOBAN 125X83 (DRAPES) ×1 IMPLANT
DRAPE TOP 10253 STERILE (DRAPES) ×2 IMPLANT
DRAPE U-SHAPE 47X51 STRL (DRAPES) ×1 IMPLANT
DRESSING AQUACEL AG SP 3.5X6 (GAUZE/BANDAGES/DRESSINGS) ×1 IMPLANT
DRSG AQUACEL AG ADV 3.5X10 (GAUZE/BANDAGES/DRESSINGS) ×1 IMPLANT
DURAPREP 26ML APPLICATOR (WOUND CARE) ×2 IMPLANT
ELECT PENCIL ROCKER SW 15FT (MISCELLANEOUS) ×1 IMPLANT
ELECT REM PT RETURN 15FT ADLT (MISCELLANEOUS) ×1 IMPLANT
GAUZE SPONGE 4X4 12PLY STRL (GAUZE/BANDAGES/DRESSINGS) IMPLANT
GLOVE BIOGEL PI IND STRL 7.0 (GLOVE) ×1 IMPLANT
GLOVE BIOGEL PI IND STRL 7.5 (GLOVE) ×1 IMPLANT
GLOVE ECLIPSE 7.0 STRL STRAW (GLOVE) ×3 IMPLANT
GLOVE SURG SYN 7.5 PF PI (GLOVE) ×2 IMPLANT
GOWN SRG XL LVL 4 BRTHBL STRL (GOWNS) ×2 IMPLANT
HEAD BIPOLAR 41 (Hips) IMPLANT
HEAD FEM STD 28X+8.5 STRL (Hips) IMPLANT
HOOD PEEL AWAY T7 (MISCELLANEOUS) ×3 IMPLANT
KIT TURNOVER KIT A (KITS) ×1 IMPLANT
LAVAGE JET IRRISEPT WOUND (IRRIGATION / IRRIGATOR) IMPLANT
MARKER SKIN DUAL TIP RULER LAB (MISCELLANEOUS) ×1 IMPLANT
NDL SPNL 18GX3.5 QUINCKE PK (NEEDLE) ×1 IMPLANT
NEEDLE SPNL 18GX3.5 QUINCKE PK (NEEDLE) ×1 IMPLANT
PACK ANTERIOR HIP CUSTOM (KITS) ×1 IMPLANT
RESTRICTOR CEMENT PE SZ 2 (Cement) IMPLANT
SET HNDPC FAN SPRY TIP SCT (DISPOSABLE) ×1 IMPLANT
SOLUTION PRONTOSAN WOUND 350ML (IRRIGATION / IRRIGATOR) ×1 IMPLANT
STEM SUMMIT CEMENTED SZ3 STD IMPLANT
SUT ETHIBOND 2 V 37 (SUTURE) ×1 IMPLANT
SUT ETHILON 2 0 PS N (SUTURE) IMPLANT
SUT MNCRL AB 3-0 PS2 18 (SUTURE) IMPLANT
SUT NYLON 3 0 (SUTURE) IMPLANT
SUT STRATAFIX PDS+ 0 24IN (SUTURE) ×1 IMPLANT
SUT VIC AB 0 CT1 36 (SUTURE) IMPLANT
SUT VIC AB 2-0 CT1 TAPERPNT 27 (SUTURE) ×2 IMPLANT
TOWER CARTRIDGE SMART MIX (DISPOSABLE) IMPLANT
TRAY CATH INTERMITTENT SS 16FR (CATHETERS) IMPLANT
TUBE SUCTION HIGH CAP CLEAR NV (SUCTIONS) ×1 IMPLANT

## 2024-08-03 NOTE — Transfer of Care (Signed)
 Immediate Anesthesia Transfer of Care Note  Patient: Mandy Adams  Procedure(s) Performed: ARTHROPLASTY, HIP, TOTAL, ANTERIOR APPROACH (Left: Hip)  Patient Location: PACU  Anesthesia Type:General  Level of Consciousness: drowsy and patient cooperative  Airway & Oxygen Therapy: Patient Spontanous Breathing and Patient connected to nasal cannula oxygen  Post-op Assessment: Report given to RN and Post -op Vital signs reviewed and stable  Post vital signs: Reviewed and stable  Last Vitals:  Vitals Value Taken Time  BP 134/58 08/03/24 16:16  Temp    Pulse 96 08/03/24 16:20  Resp 14 08/03/24 16:20  SpO2 91 % 08/03/24 16:20  Vitals shown include unfiled device data.  Last Pain:  Vitals:   08/03/24 1048  PainSc: 5          Complications: No notable events documented.

## 2024-08-03 NOTE — H&P (Signed)
 History and Physical    Patient: Mandy Adams FMW:[id number] DOB: January 26, 1932 DOA: 08/03/2024 DOS: the patient was seen and examined on 08/03/2024 PCP: Okey Carlin Redbird, MD  Patient coming from: Home  Chief Complaint:  Chief Complaint  Patient presents with   Fall   HPI: Mandy Adams is a 89 y.o. female with medical history significant of AKI, CAP, dementia, hyperlipidemia, hypertension, leukocytosis, GERD, eczema, aortic atherosclerosis, stage IIIa CKD who was brought to the emergency department due to having an unwitnessed fall at her facility injuring her left hip area with inability to bear weight on her LLE.  Fall seems to have been mechanical.  Denied any prodromal symptoms.  She also denied fever, chills, rhinorrhea, sore throat, wheezing or hemoptysis.  No chest pain, palpitations, diaphoresis, PND, orthopnea or pitting edema of the lower extremities.  No abdominal pain, nausea, emesis, diarrhea, constipation, melena or hematochezia.  No flank pain, dysuria, frequency or hematuria.  No polyuria, polydipsia, polyphagia or blurred vision.   ED course: Initial vital signs were temperature 98.9 F, pulse 91, respiration 23, BP 141/73 mmHg O2 sat 95% on room air.  The patient received a tablet of Percocet 5/325 mg p.o. in the emergency department.  Lab work: CBC showed white count 19.1, hemoglobin 10.8 g/dL platelets 672.  Normal total CK, lipase and magnesium .  CMP showed a CO2 of 18 mmol/L with a normal anion gap, the rest of the electrolytes are normal after calcium  correction.  Glucose 102, BUN 30 and creatinine 1.23 mg/dL.  Total protein was 6.0 and albumin  3.4 g/dL, the rest of the LFTs were normal.  Imaging: Portable x-ray with large hiatal hernia, but no acute findings.  Left hip x-ray showing the left femoral neck fracture.  CT head without contrast no acute intracranial pathology.  Chronic white matter changes and diffuse cerebral atrophy.  Opacification of the right sphenoid  sinus.  CT cervical spine with no evidence of acute fracture or traumatic subluxation or static signs of instability.  Multilevel spondylosis.  Interbody and at least partial interfacetal ankylosis at the C2-3 and C4-5 levels.  CT chest/abdomen/pelvis without contrast showing age-indeterminate L1 vertebral body compression fracture, unchanged large, chronic left sided diaphragmatic hernia with Argenal atrial rotation of the stomach and mass effect on the adjacent lung and mediastinum.  No evidence of bowel obstruction.  Redemonstration of left femoral neck fracture.    Review of Systems: As mentioned in the history of present illness. All other systems reviewed and are negative. Past Medical History:  Diagnosis Date   AKI (acute kidney injury) (HCC) 04/30/2018   Community acquired pneumonia 04/30/2018   Dementia (HCC)    Hyperlipidemia    Hypertension    Leukocytosis 04/30/2018   Past Surgical History:  Procedure Laterality Date   ABDOMINAL HYSTERECTOMY     Social History:  reports that she has never smoked. She has never used smokeless tobacco. She reports that she does not drink alcohol  and does not use drugs.  No Known Allergies  Family History  Problem Relation Age of Onset   Heart failure Father     Prior to Admission medications   Medication Sig Start Date End Date Taking? Authorizing Provider  CALMOSEPTINE 0.44-20.6 % OINT SMARTSIG:1 Liberally Topical Twice Daily 04/15/23   [provider]  diclofenac Sodium (VOLTAREN) 1 % GEL Apply 2 g topically 4 (four) times daily as needed (pain).    [provider]  estradiol (ESTRACE) 0.1 MG/GM vaginal cream Place 1 Applicatorful vaginally 3 (three)  times a week.    [provider]  FLONASE ALLERGY RELIEF 50 MCG/ACT nasal spray Place 1 spray into both nostrils daily. 03/18/23   [provider]  memantine  (NAMENDA ) 10 MG tablet Take 1 tablet (10 mg total) by mouth 2 (two) times daily. 05/09/22   Whitfield Raisin, NP  mupirocin  ointment (BACTROBAN ) 2 % Apply 1 Application topically 3 (three) times daily. 04/15/23   [provider]  pantoprazole  (PROTONIX ) 40 MG tablet Take 40 mg by mouth every other day. 02/04/18   [provider]  sertraline  (ZOLOFT ) 50 MG tablet Take 50 mg by mouth daily. 04/24/23   [provider]  TOPROL XL 25 MG 24 hr tablet Take 25 mg by mouth daily. 02/04/18   [provider]    Physical Exam: Vitals:   08/03/24 1030 08/03/24 1045 08/03/24 1145 08/03/24 1200  BP: (!) 159/82 (!) 163/148 123/72 121/74  Pulse: 99 92 97 98  Resp:   18 18  Temp:      SpO2: 98% 98% 92% 90%  Weight:      Height:       Physical Exam Vitals and nursing note reviewed.  Constitutional:      Appearance: Normal appearance. She is ill-appearing.  HENT:     Head: Normocephalic.     Nose: No rhinorrhea.     Mouth/Throat:     Mouth: Mucous membranes are moist.  Eyes:     General: No scleral icterus.    Pupils: Pupils are equal, round, and reactive to light.  Cardiovascular:     Rate and Rhythm: Normal rate and regular rhythm.  Pulmonary:     Effort: Pulmonary effort is normal.     Breath sounds: Normal breath sounds. No wheezing, rhonchi or rales.  Abdominal:     General: Bowel sounds are normal. There is no distension.     Palpations: Abdomen is soft.     Tenderness: There is no abdominal tenderness. There is no right CVA tenderness or left CVA tenderness.  Musculoskeletal:     Cervical back: Neck supple.     Right lower leg: No edema.     Left lower leg: No edema.  Skin:    General: Skin is warm and dry.  Neurological:     Mental Status: She is alert. Mental status is at baseline. She is disoriented.  Psychiatric:        Mood and Affect: Mood normal.        Behavior: Behavior normal.     Data Reviewed:  Results are pending, will review when available. EKG: Vent. rate 83 BPM PR interval 245 ms QRS duration 92 ms QT/QTcB 396/466  ms P-R-T axes 59 37 80 Sinus rhythm Prolonged PR interval  Assessment and Plan: Principal Problem:   Closed subcapital fracture of neck of femur, left, initial encounter (HCC) Admit to telemetry/inpatient. Ice area as needed. Analgesics as needed. Antiemetics as needed. Consult TOC team. Consult nutritional services. PT evaluation after surgery. Orthopedic surgery evaluation appreciated.  Active Problems:   Lactic acidosis Minimal. She was in the OR earlier. Will recheck now after arriving to the floor.    Leukocytosis In the setting of acute fracture.    Diaphragmatic hernia without obstruction and without gangrene Monitor clinically.    Gastro-esophageal reflux disease without esophagitis Continue PPI or formulary equivalent.    Essential hypertension Currently off Toprol. Monitor blood pressure. As needed antihypertensives.    Moderate dementia (HCC) Continue memantine  at bedtime.  Adjustment disorder Continue sertraline  50 mg p.o. daily. Continue mirtazapine  15 mg p.o. bedtime.    Stage 3 chronic kidney disease (HCC)   Mild protein malnutrition     Advance Care Planning:   Code Status: Full Code   Consults: Orthopedic surgery.  Family Communication: Her son was at bedside.  Severity of Illness: The appropriate patient status for this patient is INPATIENT. Inpatient status is judged to be reasonable and necessary in order to provide the required intensity of service to ensure the patient's safety. The patient's presenting symptoms, physical exam findings, and initial radiographic and laboratory data in the context of their chronic comorbidities is felt to place them at high risk for further clinical deterioration. Furthermore, it is not anticipated that the patient will be medically stable for discharge from the hospital within 2 midnights of admission.   * I certify that at the point of admission it is my clinical judgment that the patient will require  inpatient hospital care spanning beyond 2 midnights from the point of admission due to high intensity of service, high risk for further deterioration and high frequency of surveillance required.*  Author: Alm Dorn Castor, MD 08/03/2024 12:27 PM  For on call review [email or web address].   This document was prepared using Dragon voice recognition software and may contain some unintended transcription errors.

## 2024-08-03 NOTE — Consult Note (Signed)
 ORTHOPAEDIC CONSULTATION  REQUESTING PHYSICIAN: Celinda Alm Lot, MD  Chief Complaint: Left femoral neck fracture  HPI: Patient is a 89 year old female with advance dementia who lives in assisted living at [facility].  Had unwitnessed fall and found down.  Endorses left hip pain.  HPI obtained from son, Mandy Adams, who is bedside and is pharmacist at [employer].  Level 5 caveat for dementia.  Ortho consulted for surgical evaluation.  Past Medical History:  Diagnosis Date   AKI (acute kidney injury) (HCC) 04/30/2018   Community acquired pneumonia 04/30/2018   Dementia (HCC)    Hyperlipidemia    Hypertension    Leukocytosis 04/30/2018   Past Surgical History:  Procedure Laterality Date   ABDOMINAL HYSTERECTOMY     Social History   Socioeconomic History   Marital status: Married    Spouse name: Not on file   Number of children: Not on file   Years of education: Not on file   Highest education level: Not on file  Occupational History   Not on file  Tobacco Use   Smoking status: Never   Smokeless tobacco: Never  Vaping Use   Vaping status: Never Used  Substance and Sexual Activity   Alcohol  use: Never   Drug use: Never   Sexual activity: Not on file  Other Topics Concern   Not on file  Social History Narrative   Not on file   Social Drivers of Health   Financial Resource Strain: Not on file  Food Insecurity: Not on file  Transportation Needs: Not on file  Physical Activity: Not on file  Stress: Not on file  Social Connections: Unknown (03/27/2022)   Received from [facility]   Social Network    Social Network: Not on file   Family History  Problem Relation Age of Onset   Heart failure Father    No Known Allergies Prior to Admission medications   Medication Sig Start Date End Date Taking? Authorizing Provider  acetaminophen  (TYLENOL ) 325 MG tablet Take 650 mg by mouth every 6 (six) hours as needed for moderate pain (pain score 4-6) or mild pain (pain score  1-3).   Yes [provider]  cetirizine (ZYRTEC) 10 MG tablet Take 10 mg by mouth daily.   Yes [provider]  FLONASE ALLERGY RELIEF 50 MCG/ACT nasal spray Place 1 spray into both nostrils daily. 03/18/23  Yes [provider]  meclizine  (ANTIVERT ) 25 MG tablet Take 25 mg by mouth every 8 (eight) hours as needed (Vertigo).   Yes [provider]  memantine  (NAMENDA ) 10 MG tablet Take 1 tablet (10 mg total) by mouth 2 (two) times daily. Patient taking differently: Take 10 mg by mouth at bedtime. 05/09/22  Yes McCue, Harlene, NP  mineral oil-hydrophilic petrolatum (AQUAPHOR) ointment Apply 1 Application topically in the morning and at bedtime.   Yes [provider]  mirtazapine  (REMERON ) 15 MG tablet Take 15 mg by mouth at bedtime.   Yes [provider]  pantoprazole  (PROTONIX ) 20 MG tablet Take 20 mg by mouth daily. 02/04/18  Yes [provider]  sertraline  (ZOLOFT ) 50 MG tablet Take 50 mg by mouth daily. 04/24/23  Yes [provider]  VITAMIN D PO Take 50 mcg by mouth daily.   Yes [provider]  CALMOSEPTINE 0.44-20.6 % OINT SMARTSIG:1 Liberally Topical Twice Daily Patient not taking: Reported on 08/03/2024 04/15/23   [provider]  diclofenac Sodium (VOLTAREN) 1 % GEL Apply 2 g topically 4 (four)  times daily as needed (pain). Patient not taking: Reported on 08/03/2024    [provider]  estradiol (ESTRACE) 0.1 MG/GM vaginal cream Place 1 Applicatorful vaginally 3 (three) times a week. Patient not taking: Reported on 08/03/2024    [provider]  mupirocin  ointment (BACTROBAN ) 2 % Apply 1 Application topically 3 (three) times daily. Patient not taking: Reported on 08/03/2024 04/15/23   [provider]  TOPROL XL 25 MG 24 hr tablet Take 25 mg by mouth daily. Patient not taking: Reported on 08/03/2024 02/04/18   [provider]   CT HEAD WO CONTRAST Result Date:  08/03/2024 CLINICAL DATA:  Head trauma, fall, injury EXAM: CT HEAD WITHOUT CONTRAST TECHNIQUE: Contiguous axial images were obtained from the base of the skull through the vertex without intravenous contrast. RADIATION DOSE REDUCTION: This exam was performed according to the departmental dose-optimization program which includes automated exposure control, adjustment of the mA and/or kV according to patient size and/or use of iterative reconstruction technique. COMPARISON:  August 02, 2020 MRI FINDINGS: Brain: Nonspecific periventricular and subcortical white matter hypodensities, likely secondary to chronic microvascular disease. No acute intracranial hemorrhage. Diffuse cerebral atrophy. No midline shift. Basal cisterns are patent. Vascular: Unremarkable. Skull: No acute findings. Sinuses/Orbits: Opacification of the right sphenoid sinus. Other: None. IMPRESSION: No acute intracranial pathology. Chronic white matter changes and diffuse cerebral atrophy. Opacification of the right sphenoid sinus. Electronically Signed   By: Michaeline Blanch M.D.   On: 08/03/2024 12:07   CT CHEST ABDOMEN PELVIS WO CONTRAST Result Date: 08/03/2024 CLINICAL DATA:  Blunt poly trauma, fall, injury EXAM: CT CHEST, ABDOMEN AND PELVIS WITHOUT CONTRAST TECHNIQUE: Multidetector CT imaging of the chest, abdomen and pelvis was performed following the standard protocol without IV contrast. RADIATION DOSE REDUCTION: This exam was performed according to the departmental dose-optimization program which includes automated exposure control, adjustment of the mA and/or kV according to patient size and/or use of iterative reconstruction technique. COMPARISON:  Same day additional trauma studies and priors FINDINGS: Limited evaluation of the solid organs and vasculature due to lack of IV contrast. CT CHEST FINDINGS Cardiovascular: Normal caliber aorta. Scattered aortic calcifications. No pericardial effusion. Mediastinum/Nodes: Large left-sided  diaphragmatic hernia containing a majority of the stomach and multiple loops of large bowel with chronic organo-axial rotation of the stomach and mass effect on the adjacent heart and lungs. No lymphadenopathy. Lungs/Pleura: Similar appearance of biapical pleuroparenchymal scarring and calcified sub pleural plaques. Chronic left lower lobe atelectasis/consolidation related to large left diaphragmatic hernia. Right basilar atelectasis. No pleural effusions or pneumothorax. Musculoskeletal: Age-indeterminate L1 vertebral body compression fracture. CT ABDOMEN PELVIS FINDINGS Hepatobiliary: Unremarkable. Pancreas: Unremarkable. Spleen: Unremarkable. Adrenals/Urinary Tract: Adrenal glands are unremarkable. No hydronephrosis or nephrolithiasis. Stomach/Bowel: No evidence of bowel obstruction or inflammation. Colonic diverticulosis. Large left-sided diaphragmatic hernia as described above. Vascular/Lymphatic: Normal caliber abdominal aorta. Reproductive: Status post hysterectomy. No adnexal masses. Other: No free air or free fluid. Musculoskeletal: Age-indeterminate L1 vertebral body compression fracture. Mildly displaced and angulated left femoral neck fracture. Multilevel degenerative vertebral changes. IMPRESSION: Limited evaluation of the solid organs and vasculature without IV contrast. 1.  Redemonstration of left femoral neck fracture. 2.  Age indeterminate L1 vertebral body compression fracture. 3. Unchanged large, chronic left-sided diaphragmatic hernia with organoaxial rotation of the stomach and mass effect on the adjacent lung and mediastinum. No evidence of bowel obstruction. Electronically Signed   By: Michaeline Blanch M.D.   On: 08/03/2024 12:03   CT CERVICAL SPINE WO CONTRAST Result Date: 08/03/2024  CLINICAL DATA:  Poly trauma, blood. Unwitnessed fall. History of hypertension and dementia. EXAM: CT CERVICAL SPINE WITHOUT CONTRAST TECHNIQUE: Multidetector CT imaging of the cervical spine was performed without  intravenous contrast. Multiplanar CT image reconstructions were also generated. RADIATION DOSE REDUCTION: This exam was performed according to the departmental dose-optimization program which includes automated exposure control, adjustment of the mA and/or kV according to patient size and/or use of iterative reconstruction technique. COMPARISON:  Chest CT 04/30/2018 FINDINGS: Alignment: 3 mm of anterolisthesis at C3-4 appears degenerative. Skull base and vertebrae: No evidence of acute fracture or traumatic subluxation. There is evidence for interbody and at least partial interfacetal ankylosis at the C2-3 and C4-5 levels. Mild asymmetric widening of the left C3-4 facet joint. Soft tissues and spinal canal: No prevertebral fluid or swelling. No visible canal hematoma. Disc levels: Multilevel spondylosis with disc space narrowing, uncinate spurring and facet hypertrophy. As above, interbody and at least partial interfacetal ankylosis at the C2-3 and C4-5 levels. Degenerative appearing anterolisthesis at C3-4 with resulting mild spinal stenosis and mild to moderate foraminal narrowing bilaterally. Upper chest: Chest CT findings dictated separately. Other: Bilateral carotid atherosclerosis. IMPRESSION: 1. No evidence of acute cervical spine fracture, traumatic subluxation or static signs of instability. 2. Multilevel cervical spondylosis as described with degenerative anterolisthesis at C3-4. 3. 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 Electronically Signed   By: Elsie Perone M.D.   On: 08/03/2024 11:43   DG Hip Unilat W or Wo Pelvis 2-3 Views Left Result Date: 08/03/2024 CLINICAL DATA:  Fall, left leg pain. EXAM: DG HIP (WITH OR WITHOUT PELVIS) 2-3V LEFT COMPARISON:  None Available. FINDINGS: Displaced left femoral neck fracture with superior displacement of the distal fracture fragment and varus angulation. No additional evidence of an acute fracture. Degenerative changes in the  lower lumbosacral spine are incidentally imaged. IMPRESSION: Left femoral neck fracture. Electronically Signed   By: Newell Eke M.D.   On: 08/03/2024 10:24   DG Chest Port 1 View Result Date: 08/03/2024 CLINICAL DATA:  Fall, left leg pain. EXAM: PORTABLE CHEST 1 VIEW COMPARISON:  01/02/2024 and CT chest 04/30/2018. FINDINGS: Trachea is midline. Heart is at the upper limits of normal in size to mildly enlarged, stable. Thoracic aorta is calcified. Very large hiatal hernia in the lower left hemithorax. Lungs are clear. No pleural fluid. IMPRESSION: 1. No acute findings. 2. Large hiatal hernia. Electronically Signed   By: Newell Eke M.D.   On: 08/03/2024 10:24    All pertinent xrays, MRI, CT independently reviewed and interpreted  Positive ROS: All other systems have been reviewed and were otherwise negative with the exception of those mentioned in the HPI and as above.  Physical Exam: General: No acute distress Cardiovascular: No pedal edema Respiratory: No cyanosis, no use of accessory musculature GI: No organomegaly, abdomen is soft and non-tender Skin: No lesions in the area of chief complaint Neurologic: Sensation intact distally Psychiatric: Patient is at baseline mood and affect.  Pleasant dementia. Lymphatic: No axillary or cervical lymphadenopathy  MUSCULOSKELETAL:  - pain with movement of the hip and extremity - skin intact - NVI distally - compartments soft  Assessment: Left femoral neck fracture  Plan: - surgical treatment is recommended for pain relief, quality of life and early mobilization - patient and family are aware of r/b/a and wish to proceed, informed consent obtained - medical optimization per primary team - surgery is planned for today - DVT ppx to start POD 1  Thank you for the consult and the opportunity to see Mandy Adams  [name], MD [facility] 1:59 PM

## 2024-08-03 NOTE — ED Notes (Signed)
 PT transported to CT>

## 2024-08-03 NOTE — Progress Notes (Signed)
 Consult received from EDP.  I have reviewed imaging.  Patient will need surgical treatment.  Plan is to take her to surgery this afternoon.  Continue NPO.

## 2024-08-03 NOTE — Op Note (Addendum)
 ARTHROPLASTY, HIP, TOTAL, ANTERIOR APPROACH  Procedure Note Mandy Adams   [id number]  Pre-op Diagnosis: Left displaced femoral neck fracture     Post-op Diagnosis: same   Operative Procedures  1. Cemented hemiarthorplasty for left femoral neck fracture. CPT [phone]  Operative Findings 1.  Acute subcapital femoral neck fracture  SURGEON: Mandy Adams, M.D.  ASSIST: Ronal Morna Grave, [place]   Anesthesia: general  Prosthesis: Depuy Femur: Cemented summit 3 STD Head: 41/22 size: +8.5 Bearing Type: bipolar  Hip Hemiarthroplasty (Anterior Approach) Op Note:  After informed consent was obtained and the operative extremity marked in the holding area, the patient was brought back to the operating room and placed supine on the HANA table. Next, the operative extremity was prepped and draped in normal sterile fashion. Surgical timeout occurred verifying patient identification, surgical site, surgical procedure and administration of antibiotics.  A 8 cm longitudinal incision was made starting from 2 fingerbreadths lateral and inferior to the ASIS towards the lateral aspect of the patella.  A Hueter approach to the hip was performed, using the interval between tensor fascia lata and sartorius.  Dissection was carried bluntly down onto the anterior hip capsule. The lateral femoral circumflex vessels were identified and coagulated. A capsulotomy was performed and fracture hematoma was evacuated and the capsular flaps tagged for later repair.  A napkin ring neck osteotomy was performed below the fracture about 1 fingerbreadth above the lesser trochanter.  The calcar was intact.  The femoral head was removed and found a 41 mm head was the appropriate fit.    We then turned our attention to the femur.  After placing the femoral hook, the leg was taken to externally rotated, extended and adducted position taking care to perform soft tissue releases to allow for adequate mobilization of the femur. Soft  tissue was cleared from the shoulder of the greater trochanter and the hook elevator used to improve exposure of the proximal femur.  Lateral bone from the shoulder was rasped to provide relief.  Sequential broaching performed up to a size 3.   Standard trial neck and +1.5 head were initially placed. The leg was brought back up to neutral and the construct reduced. The position and sizing of components, offset and leg lengths were checked using fluoroscopy.  Based on fluoroscopic findings, we chose to retrial with standard neck and +8.5 head ball.  Stability of the construct was checked in extension and external rotation to 90 degrees without any subluxation or impingement of prosthesis. We dislocated the prosthesis, dropped the leg back into position, removed trial components, and irrigated copiously. We chose to go with the standard neck and +8.5 head ball.  The canal was thoroughly irrigated and dried.  Cement restrictor was placed down the canal to the proper depth.  The final stem and head were chosen then placed and the cement cured, the leg brought back up, the system reduced and fluoroscopy used to verify positioning.  We irrigated, obtained hemostasis and closed the capsule using #2 ethibond suture.  The fascia was closed with #1 stratafix, the deep fat layer was closed with 0 vicryl, the subcutaneous layers closed with 2.0 Vicryl Plus and the skin closed with 2.0 nylon. A sterile dressing was applied. The patient was awakened in the operating room and taken to recovery in stable condition. All sponge, needle, and instrument counts were correct at the end of the case.   Morna Grave, my PA, was necessary for opening, closing, exposing, retracting, limb positioning and  overall facilitation and completion of the surgery.  Position: supine  Complications: see description of procedure.  Time Out: performed   Drains/Packing: none  Estimated blood loss: see anesthesia record  Returned to Recovery  Room: in good condition.   Antibiotics: yes   Mechanical VTE (DVT) Prophylaxis: sequential compression devices, TED thigh-high  Chemical VTE (DVT) Prophylaxis: aspirin  325 BID x 4 weeks  Fluid Replacement: Crystalloid: see anesthesia record  Specimens Removed: 1 to pathology   Sponge and Instrument Count Correct? yes   PACU: portable radiograph - low AP   Admission: inpatient status, start PT & OT POD#1  Plan/RTC: Return in 2 weeks for suture removal.   Weight Bearing/Load Lower Extremity: full  Hip precautions: none  [name], MD [facility] 3:47 PM   Implant Name Type Inv. Item Serial No. Manufacturer Lot No. LRB No. Used Action  RESTRICTOR CEMENT PE SZ 2 - [id number] Cement RESTRICTOR CEMENT PE SZ 2  [employer] [id number] Left 1 Implanted  Self Centering Bi-Polar Head    [employer] [id number] Left 1 Implanted  Femoral Stem 12/14 taper cemented    [employer] [id number] Left 1 Implanted

## 2024-08-03 NOTE — Anesthesia Preprocedure Evaluation (Addendum)
 Anesthesia Evaluation  Patient identified by MRN, date of birth, ID band Patient awake    Reviewed: Allergy & Precautions, NPO status , Patient's Chart, lab work & pertinent test results, reviewed documented beta blocker date and time   History of Anesthesia Complications Negative for: history of anesthetic complications  Airway Mallampati: II  TM Distance: >3 FB     Dental  (+) Upper Dentures, Lower Dentures   Pulmonary neg shortness of breath, pneumonia   breath sounds clear to auscultation       Cardiovascular hypertension, (-) Past MI, (-) Cardiac Stents, (-) CABG and (-) Peripheral Vascular Disease  Rhythm:Regular Rate:Normal     Neuro/Psych neg Seizures PSYCHIATRIC DISORDERS     Dementia  Neuromuscular disease    GI/Hepatic ,neg GERD  ,,(+) neg Cirrhosis        Endo/Other    Renal/GU Renal disease     Musculoskeletal  (+) Arthritis ,    Abdominal   Peds  Hematology  (+) Blood dyscrasia, anemia   Anesthesia Other Findings   Reproductive/Obstetrics                              Anesthesia Physical Anesthesia Plan  ASA: 3  Anesthesia Plan: General   Post-op Pain Management:    Induction: Intravenous  PONV Risk Score and Plan: 2 and Ondansetron  and Dexamethasone  Airway Management Planned: Oral ETT  Additional Equipment:   Intra-op Plan:   Post-operative Plan: Extubation in OR  Informed Consent: I have reviewed the patients History and Physical, chart, labs and discussed the procedure including the risks, benefits and alternatives for the proposed anesthesia with the patient or authorized representative who has indicated his/her understanding and acceptance.     Dental advisory given  Plan Discussed with: CRNA  Anesthesia Plan Comments:          Anesthesia Quick Evaluation

## 2024-08-03 NOTE — ED Notes (Signed)
 Pt placed on pure wick, appropriate education given and verbalized understandings

## 2024-08-03 NOTE — Anesthesia Procedure Notes (Signed)
 Procedure Name: Intubation Date/Time: 08/03/2024 2:29 PM  Performed by: Cena Epps, CRNAPre-anesthesia Checklist: Patient identified, Emergency Drugs available, Suction available and Patient being monitored Patient Re-evaluated:Patient Re-evaluated prior to induction Oxygen Delivery Method: Circle System Utilized Preoxygenation: Pre-oxygenation with 100% oxygen Induction Type: IV induction Ventilation: Mask ventilation without difficulty Laryngoscope Size: Mac and 3 Grade View: Grade I Tube type: Oral Tube size: 7.0 mm Number of attempts: 1 Airway Equipment and Method: Stylet and Oral airway Placement Confirmation: ETT inserted through vocal cords under direct vision, positive ETCO2 and breath sounds checked- equal and bilateral Secured at: 21 cm Tube secured with: Tape Dental Injury: Teeth and Oropharynx as per pre-operative assessment

## 2024-08-03 NOTE — ED Triage Notes (Signed)
 Pt arrived via PTA from facility with [facility] unwitnessed fall. Denies thinners unclear if syncope or mechanical. Pt report left leg pain no obvious deformity. Hx HTN dementia

## 2024-08-03 NOTE — Plan of Care (Signed)
  Problem: Pain Managment: Goal: General experience of comfort will improve and/or be controlled Outcome: Progressing

## 2024-08-03 NOTE — H&P (Signed)

## 2024-08-03 NOTE — ED Notes (Signed)
 Pt verbalizes understanding of pain medication risks and benefits. Pt agrees with fall precuations

## 2024-08-03 NOTE — Discharge Instructions (Signed)
 INSTRUCTIONS AFTER JOINT REPLACEMENT   Remove items at home which could result in a fall. This includes throw rugs or furniture in walking pathways ICE to the affected joint every three hours while awake for 30 minutes at a time, for at least the first 3-5 days, and then as needed for pain and swelling.  Continue to use ice for pain and swelling. You may notice swelling that will progress down to the foot and ankle.  This is normal after surgery.  Elevate your leg when you are not up walking on it.   Continue to use the breathing machine you got in the hospital (incentive spirometer) which will help keep your temperature down.  It is common for your temperature to cycle up and down following surgery, especially at night when you are not up moving around and exerting yourself.  The breathing machine keeps your lungs expanded and your temperature down.   DIET:  As you were doing prior to hospitalization, we recommend a well-balanced diet.  DRESSING / WOUND CARE / SHOWERING  You may change your dressing 3-5 days after surgery.  Then change the dressing every day with sterile gauze.  Please use good hand washing techniques before changing the dressing.  Do not use any lotions or creams on the incision until instructed by your surgeon.  ACTIVITY  Increase activity slowly as tolerated, but follow the weight bearing instructions below.   No driving for 6 weeks or until further direction given by your physician.  You cannot drive while taking narcotics.  No lifting or carrying greater than 10 lbs. until further directed by your surgeon. Avoid periods of inactivity such as sitting longer than an hour when not asleep. This helps prevent blood clots.  You may return to work once you are authorized by your doctor.     WEIGHT BEARING   Weight bearing as tolerated with assist device (walker, cane, etc) as directed, use it as long as suggested by your surgeon or therapist, typically at least 4-6  weeks.   EXERCISES  Results after joint replacement surgery are often greatly improved when you follow the exercise, range of motion and muscle strengthening exercises prescribed by your doctor. Safety measures are also important to protect the joint from further injury. Any time any of these exercises cause you to have increased pain or swelling, decrease what you are doing until you are comfortable again and then slowly increase them. If you have problems or questions, call your caregiver or physical therapist for advice.   Rehabilitation is important following a joint replacement. After just a few days of immobilization, the muscles of the leg can become weakened and shrink (atrophy).  These exercises are designed to build up the tone and strength of the thigh and leg muscles and to improve motion. Often times heat used for twenty to thirty minutes before working out will loosen up your tissues and help with improving the range of motion but do not use heat for the first two weeks following surgery (sometimes heat can increase post-operative swelling).   These exercises can be done on a training (exercise) mat, on the floor, on a table or on a bed. Use whatever works the best and is most comfortable for you.    Use music or television while you are exercising so that the exercises are a pleasant break in your day. This will make your life better with the exercises acting as a break in your routine that you can look forward  to.   Perform all exercises about fifteen times, three times per day or as directed.  You should exercise both the operative leg and the other leg as well.  Exercises include:   Quad Sets - Tighten up the muscle on the front of the thigh (Quad) and hold for 5-10 seconds.   Straight Leg Raises - With your knee straight (if you were given a brace, keep it on), lift the leg to 60 degrees, hold for 3 seconds, and slowly lower the leg.  Perform this exercise against resistance later as  your leg gets stronger.  Leg Slides: Lying on your back, slowly slide your foot toward your buttocks, bending your knee up off the floor (only go as far as is comfortable). Then slowly slide your foot back down until your leg is flat on the floor again.  Angel Wings: Lying on your back spread your legs to the side as far apart as you can without causing discomfort.  Hamstring Strength:  Lying on your back, push your heel against the floor with your leg straight by tightening up the muscles of your buttocks.  Repeat, but this time bend your knee to a comfortable angle, and push your heel against the floor.  You may put a pillow under the heel to make it more comfortable if necessary.   A rehabilitation program following joint replacement surgery can speed recovery and prevent re-injury in the future due to weakened muscles. Contact your doctor or a physical therapist for more information on knee rehabilitation.    CONSTIPATION  Constipation is defined medically as fewer than three stools per week and severe constipation as less than one stool per week.  Even if you have a regular bowel pattern at home, your normal regimen is likely to be disrupted due to multiple reasons following surgery.  Combination of anesthesia, postoperative narcotics, change in appetite and fluid intake all can affect your bowels.   YOU MUST use at least one of the following options; they are listed in order of increasing strength to get the job done.  They are all available over the counter, and you may need to use some, POSSIBLY even all of these options:    Drink plenty of fluids (prune juice may be helpful) and high fiber foods Colace 100 mg by mouth twice a day  Senokot for constipation as directed and as needed Dulcolax (bisacodyl), take with full glass of water  Miralax  (polyethylene glycol) once or twice a day as needed.  If you have tried all these things and are unable to have a bowel movement in the first 3-4 days  after surgery call either your surgeon or your primary doctor.    If you experience loose stools or diarrhea, hold the medications until you stool forms back up.  If your symptoms do not get better within 1 week or if they get worse, check with your doctor.  If you experience the worst abdominal pain ever or develop nausea or vomiting, please contact the office immediately for further recommendations for treatment.   ITCHING:  If you experience itching with your medications, try taking only a single pain pill, or even half a pain pill at a time.  You can also use Benadryl over the counter for itching or also to help with sleep.   TED HOSE STOCKINGS:  Use stockings on both legs until for at least 2 weeks or as directed by physician office. They may be removed at night for sleeping.  MEDICATIONS:  See your medication summary on the "After Visit Summary" that nursing will review with you.  You may have some home medications which will be placed on hold until you complete the course of blood thinner medication.  It is important for you to complete the blood thinner medication as prescribed.  PRECAUTIONS:  If you experience chest pain or shortness of breath - call 911 immediately for transfer to the hospital emergency department.   If you develop a fever greater that 101 F, purulent drainage from wound, increased redness or drainage from wound, foul odor from the wound/dressing, or calf pain - CONTACT YOUR SURGEON.                                                   FOLLOW-UP APPOINTMENTS:  If you do not already have a post-op appointment, please call the office for an appointment to be seen by your surgeon.  Guidelines for how soon to be seen are listed in your "After Visit Summary", but are typically between 1-4 weeks after surgery.  OTHER INSTRUCTIONS:   Knee Replacement:  Do not place pillow under knee, focus on keeping the knee straight while resting. CPM instructions: 0-90 degrees, 2 hours in the  morning, 2 hours in the afternoon, and 2 hours in the evening. Place foam block, curve side up under heel at all times except when in CPM or when walking.  DO NOT modify, tear, cut, or change the foam block in any way.  POST-OPERATIVE OPIOID TAPER INSTRUCTIONS: It is important to wean off of your opioid medication as soon as possible. If you do not need pain medication after your surgery it is ok to stop day one. Opioids include: Codeine, Hydrocodone (Norco, Vicodin), Oxycodone (Percocet, oxycontin ) and hydromorphone  amongst others.  Long term and even short term use of opiods can cause: Increased pain response Dependence Constipation Depression Respiratory depression And more.  Withdrawal symptoms can include Flu like symptoms Nausea, vomiting And more Techniques to manage these symptoms Hydrate well Eat regular healthy meals Stay active Use relaxation techniques(deep breathing, meditating, yoga) Do Not substitute Alcohol  to help with tapering If you have been on opioids for less than two weeks and do not have pain than it is ok to stop all together.  Plan to wean off of opioids This plan should start within one week post op of your joint replacement. Maintain the same interval or time between taking each dose and first decrease the dose.  Cut the total daily intake of opioids by one tablet each day Next start to increase the time between doses. The last dose that should be eliminated is the evening dose.   Per [facility] clinic policy, our goal is ensure optimal postoperative pain control with a multimodal pain management strategy. For all [facility] patients, our goal is to wean post-operative narcotic medications by 6 weeks post-operatively. If this is not possible due to utilization of pain medication prior to surgery, your [facility] doctor will support your acute post-operative pain control for the first 6 weeks postoperatively, with a plan to transition you back to your primary pain  team following that. [facility] will work to ensure a Therapist, occupational.   MAKE SURE YOU:  Understand these instructions.  Get help right away if you are not doing well or get worse.    Thank you for letting  us  be a part of your medical care team.  It is a privilege we respect greatly.  We hope these instructions will help you stay on track for a fast and full recovery!

## 2024-08-03 NOTE — ED Provider Notes (Signed)
 [place] EMERGENCY DEPARTMENT AT [facility] Provider Note   CSN: [id number] Arrival date & time: 08/03/24  9152     Patient presents with: Mandy Adams is a 89 y.o. female.    Fall Associated symptoms include abdominal pain.  Patient presents after an unwitnessed fall.  Medical history includes HTN, HLD, dementia.  She is not prescribed any blood thinners.  She was found on the ground at her nursing facility.  Patient endorses pain in lower abdomen and left hip.     Prior to Admission medications   Medication Sig Start Date End Date Taking? Authorizing Provider  CALMOSEPTINE 0.44-20.6 % OINT SMARTSIG:1 Liberally Topical Twice Daily 04/15/23   [provider]  diclofenac Sodium (VOLTAREN) 1 % GEL Apply 2 g topically 4 (four) times daily as needed (pain).    [provider]  estradiol (ESTRACE) 0.1 MG/GM vaginal cream Place 1 Applicatorful vaginally 3 (three) times a week.    [provider]  FLONASE ALLERGY RELIEF 50 MCG/ACT nasal spray Place 1 spray into both nostrils daily. 03/18/23   [provider]  memantine  (NAMENDA ) 10 MG tablet Take 1 tablet (10 mg total) by mouth 2 (two) times daily. 05/09/22   Whitfield Raisin, NP  mupirocin  ointment (BACTROBAN ) 2 % Apply 1 Application topically 3 (three) times daily. 04/15/23   [provider]  pantoprazole  (PROTONIX ) 40 MG tablet Take 40 mg by mouth every other day. 02/04/18   [provider]  sertraline  (ZOLOFT ) 50 MG tablet Take 50 mg by mouth daily. 04/24/23   [provider]  TOPROL XL 25 MG 24 hr tablet Take 25 mg by mouth daily. 02/04/18   [provider]    Allergies: Patient has no known allergies.    Review of Systems  Gastrointestinal:  Positive for abdominal pain.  Musculoskeletal:  Positive for arthralgias.  All other systems reviewed and are negative.   Updated Vital Signs BP 121/74   Pulse 98   Temp 98.9 F (37.2 C)   Resp 18    Ht 5' (1.524 m)   Wt 54 kg   SpO2 90%   BMI 23.25 kg/m   Physical Exam Vitals and nursing note reviewed.  Constitutional:      General: She is not in acute distress.    Appearance: Normal appearance. She is well-developed. She is not ill-appearing, toxic-appearing or diaphoretic.  HENT:     Head: Normocephalic and atraumatic.     Right Ear: External ear normal.     Left Ear: External ear normal.     Nose: Nose normal.     Mouth/Throat:     Mouth: Mucous membranes are moist.  Eyes:     Extraocular Movements: Extraocular movements intact.     Conjunctiva/sclera: Conjunctivae normal.  Cardiovascular:     Rate and Rhythm: Normal rate and regular rhythm.  Pulmonary:     Effort: Pulmonary effort is normal. No respiratory distress.     Breath sounds: Normal breath sounds.  Chest:     Chest wall: No tenderness.  Abdominal:     General: There is no distension.     Palpations: Abdomen is soft.     Tenderness: There is abdominal tenderness. There is no guarding or rebound.  Musculoskeletal:        General: No swelling or deformity.     Cervical back: Normal range of motion and neck supple.  Skin:    General: Skin is warm and dry.  Coloration: Skin is not jaundiced or pale.  Neurological:     General: No focal deficit present.     Mental Status: She is alert. Mental status is at baseline. She is disoriented.  Psychiatric:        Mood and Affect: Mood normal.        Behavior: Behavior normal.     (all labs ordered are listed, but only abnormal results are displayed) Labs Reviewed  CBC - Abnormal; Notable for the following components:      Result Value   WBC 19.1 (*)    Hemoglobin 10.8 (*)    HCT 35.3 (*)    RDW 17.0 (*)    All other components within normal limits  COMPREHENSIVE METABOLIC PANEL WITH GFR - Abnormal; Notable for the following components:   CO2 18 (*)    Glucose, Bld 102 (*)    BUN 30 (*)    Creatinine, Ser 1.23 (*)    Calcium  8.3 (*)    Total Protein  6.0 (*)    Albumin  3.4 (*)    GFR, Estimated 42 (*)    All other components within normal limits  CBG MONITORING, ED - Abnormal; Notable for the following components:   Glucose-Capillary 105 (*)    All other components within normal limits  I-STAT CG4 LACTIC ACID, ED - Abnormal; Notable for the following components:   Lactic Acid, Venous 2.2 (*)    All other components within normal limits  CK  LIPASE, BLOOD  MAGNESIUM   URINALYSIS, ROUTINE W REFLEX MICROSCOPIC    EKG: EKG Interpretation Date/Time:  Monday August 03 2024 09:05:02 EDT Ventricular Rate:  83 PR Interval:  245 QRS Duration:  92 QT Interval:  396 QTC Calculation: 466 R Axis:   37  Text Interpretation: Sinus rhythm Prolonged PR interval Confirmed by Melvenia Motto ([id number]) on 08/03/2024 10:02:16 AM  Radiology: CT HEAD WO CONTRAST Result Date: 08/03/2024 CLINICAL DATA:  Head trauma, fall, injury EXAM: CT HEAD WITHOUT CONTRAST TECHNIQUE: Contiguous axial images were obtained from the base of the skull through the vertex without intravenous contrast. RADIATION DOSE REDUCTION: This exam was performed according to the departmental dose-optimization program which includes automated exposure control, adjustment of the mA and/or kV according to patient size and/or use of iterative reconstruction technique. COMPARISON:  August 02, 2020 MRI FINDINGS: Brain: Nonspecific periventricular and subcortical white matter hypodensities, likely secondary to chronic microvascular disease. No acute intracranial hemorrhage. Diffuse cerebral atrophy. No midline shift. Basal cisterns are patent. Vascular: Unremarkable. Skull: No acute findings. Sinuses/Orbits: Opacification of the right sphenoid sinus. Other: None. IMPRESSION: No acute intracranial pathology. Chronic white matter changes and diffuse cerebral atrophy. Opacification of the right sphenoid sinus. Electronically Signed   By: Michaeline Blanch M.D.   On: 08/03/2024 12:07   CT CHEST ABDOMEN  PELVIS WO CONTRAST Result Date: 08/03/2024 CLINICAL DATA:  Blunt poly trauma, fall, injury EXAM: CT CHEST, ABDOMEN AND PELVIS WITHOUT CONTRAST TECHNIQUE: Multidetector CT imaging of the chest, abdomen and pelvis was performed following the standard protocol without IV contrast. RADIATION DOSE REDUCTION: This exam was performed according to the departmental dose-optimization program which includes automated exposure control, adjustment of the mA and/or kV according to patient size and/or use of iterative reconstruction technique. COMPARISON:  Same day additional trauma studies and priors FINDINGS: Limited evaluation of the solid organs and vasculature due to lack of IV contrast. CT CHEST FINDINGS Cardiovascular: Normal caliber aorta. Scattered aortic calcifications. No pericardial effusion. Mediastinum/Nodes: Large left-sided diaphragmatic hernia containing  a majority of the stomach and multiple loops of large bowel with chronic organo-axial rotation of the stomach and mass effect on the adjacent heart and lungs. No lymphadenopathy. Lungs/Pleura: Similar appearance of biapical pleuroparenchymal scarring and calcified sub pleural plaques. Chronic left lower lobe atelectasis/consolidation related to large left diaphragmatic hernia. Right basilar atelectasis. No pleural effusions or pneumothorax. Musculoskeletal: Age-indeterminate L1 vertebral body compression fracture. CT ABDOMEN PELVIS FINDINGS Hepatobiliary: Unremarkable. Pancreas: Unremarkable. Spleen: Unremarkable. Adrenals/Urinary Tract: Adrenal glands are unremarkable. No hydronephrosis or nephrolithiasis. Stomach/Bowel: No evidence of bowel obstruction or inflammation. Colonic diverticulosis. Large left-sided diaphragmatic hernia as described above. Vascular/Lymphatic: Normal caliber abdominal aorta. Reproductive: Status post hysterectomy. No adnexal masses. Other: No free air or free fluid. Musculoskeletal: Age-indeterminate L1 vertebral body compression  fracture. Mildly displaced and angulated left femoral neck fracture. Multilevel degenerative vertebral changes. IMPRESSION: Limited evaluation of the solid organs and vasculature without IV contrast. 1.  Redemonstration of left femoral neck fracture. 2.  Age indeterminate L1 vertebral body compression fracture. 3. Unchanged large, chronic left-sided diaphragmatic hernia with organoaxial rotation of the stomach and mass effect on the adjacent lung and mediastinum. No evidence of bowel obstruction. Electronically Signed   By: Michaeline Blanch M.D.   On: 08/03/2024 12:03   CT CERVICAL SPINE WO CONTRAST Result Date: 08/03/2024 CLINICAL DATA:  Poly trauma, blood. Unwitnessed fall. History of hypertension and dementia. EXAM: CT CERVICAL SPINE WITHOUT CONTRAST TECHNIQUE: Multidetector CT imaging of the cervical spine was performed without intravenous contrast. Multiplanar CT image reconstructions were also generated. RADIATION DOSE REDUCTION: This exam was performed according to the departmental dose-optimization program which includes automated exposure control, adjustment of the mA and/or kV according to patient size and/or use of iterative reconstruction technique. COMPARISON:  Chest CT 04/30/2018 FINDINGS: Alignment: 3 mm of anterolisthesis at C3-4 appears degenerative. Skull base and vertebrae: No evidence of acute fracture or traumatic subluxation. There is evidence for interbody and at least partial interfacetal ankylosis at the C2-3 and C4-5 levels. Mild asymmetric widening of the left C3-4 facet joint. Soft tissues and spinal canal: No prevertebral fluid or swelling. No visible canal hematoma. Disc levels: Multilevel spondylosis with disc space narrowing, uncinate spurring and facet hypertrophy. As above, interbody and at least partial interfacetal ankylosis at the C2-3 and C4-5 levels. Degenerative appearing anterolisthesis at C3-4 with resulting mild spinal stenosis and mild to moderate foraminal narrowing  bilaterally. Upper chest: Chest CT findings dictated separately. Other: Bilateral carotid atherosclerosis. IMPRESSION: 1. No evidence of acute cervical spine fracture, traumatic subluxation or static signs of instability. 2. Multilevel cervical spondylosis as described with degenerative anterolisthesis at C3-4. 3. 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 Electronically Signed   By: Elsie Perone M.D.   On: 08/03/2024 11:43   DG Hip Unilat W or Wo Pelvis 2-3 Views Left Result Date: 08/03/2024 CLINICAL DATA:  Fall, left leg pain. EXAM: DG HIP (WITH OR WITHOUT PELVIS) 2-3V LEFT COMPARISON:  None Available. FINDINGS: Displaced left femoral neck fracture with superior displacement of the distal fracture fragment and varus angulation. No additional evidence of an acute fracture. Degenerative changes in the lower lumbosacral spine are incidentally imaged. IMPRESSION: Left femoral neck fracture. Electronically Signed   By: Newell Eke M.D.   On: 08/03/2024 10:24   DG Chest Port 1 View Result Date: 08/03/2024 CLINICAL DATA:  Fall, left leg pain. EXAM: PORTABLE CHEST 1 VIEW COMPARISON:  01/02/2024 and CT chest 04/30/2018. FINDINGS: Trachea is midline. Heart is at the upper limits  of normal in size to mildly enlarged, stable. Thoracic aorta is calcified. Very large hiatal hernia in the lower left hemithorax. Lungs are clear. No pleural fluid. IMPRESSION: 1. No acute findings. 2. Large hiatal hernia. Electronically Signed   By: Newell Eke M.D.   On: 08/03/2024 10:24     Procedures   Medications Ordered in the ED  fentaNYL  (SUBLIMAZE ) injection 50 mcg (has no administration in time range)  lactated ringers  infusion (has no administration in time range)  oxyCODONE -acetaminophen  (PERCOCET/ROXICET) 5-325 MG per tablet 1 tablet (1 tablet Oral Given 08/03/24 0915)  fentaNYL  (SUBLIMAZE ) injection 50 mcg (50 mcg Intravenous Given 08/03/24 1003)                                     Medical Decision Making Amount and/or Complexity of Data Reviewed Radiology: ordered.  Risk Prescription drug management.   This patient presents to the ED for concern of fall, this involves an extensive number of treatment options, and is a complaint that carries with it a high risk of complications and morbidity.  The differential diagnosis includes acute injuries   Co morbidities / Chronic conditions that complicate the patient evaluation  HTN, HLD, dementia   Additional history obtained:  Additional history obtained from EMR External records from outside source obtained and reviewed including N/A   Lab Tests:  I Ordered, and personally interpreted labs.  The pertinent results include: Leukocytosis and slight lactic acidosis present consistent with traumatic injury.  Lab work is otherwise unremarkable.   Imaging Studies ordered:  I ordered imaging studies including x-ray of chest and left hip; CT of head, cervical spine, chest, abdomen, pelvis I independently visualized and interpreted imaging which showed left femoral neck fracture without any other acute findings I agree with the radiologist interpretation   Cardiac Monitoring: / EKG:  The patient was maintained on a cardiac monitor.  I personally viewed and interpreted the cardiac monitored which showed an underlying rhythm of: Sinus rhythm   Problem List / ED Course / Critical interventions / Medication management  Patient presenting after unwitnessed fall at nursing facility.  She does have a history of dementia so history is limited.  She does endorse pain in lower abdomen and left hip.  She is able to actively flex her left hip.  She rests in position of left hip flexion for comfort.  She refuses to straighten her leg.  Her abdomen is soft but she does endorse some mild tenderness in the lower aspect.  Percocet was ordered for analgesia.  Workup was initiated.  X-ray of left hip does show a left numeral neck  fracture.  I spoke with her son who reports that she is ambulatory with walker at baseline.  He was informed of x-ray findings and plan for admission.  I spoke with orthopedic surgeon on-call, Dr. Jerri, who will see the patient in consult.  Patient remaining imaging studies did not show any acute findings.  Leukocytosis and slight elevation in lactic acid explained by traumatic injury.  Patient was admitted for further management. I ordered medication including Percocet and fentanyl  for analgesia Reevaluation of the patient after these medicines showed that the patient improved I have reviewed the patients home medicines and have made adjustments as needed   Consultations Obtained:  I requested consultation with the orthopedic surgeon, Dr. Jerri,  and discussed lab and imaging findings as well as pertinent plan -  they recommend: Will see in consult   Social Determinants of Health:  Has dementia, resides in nursing facility     Final diagnoses:  Fall, initial encounter  Closed displaced fracture of left femoral neck [facility])    ED Discharge Orders     None          Melvenia Motto, MD 08/03/24 1225

## 2024-08-03 NOTE — ED Notes (Signed)
 Chief Complaint  Patient presents with   Fall   Pt reporting left hip pain. Directly unsure of year, baseline. IV started pain meds given awaiting imaging and labs

## 2024-08-03 NOTE — ED Notes (Signed)
 Pt transported to CT ?

## 2024-08-04 ENCOUNTER — Encounter (HOSPITAL_COMMUNITY): Payer: Self-pay | Admitting: Orthopaedic Surgery

## 2024-08-04 DIAGNOSIS — D72829 Elevated white blood cell count, unspecified: Secondary | ICD-10-CM | POA: Diagnosis not present

## 2024-08-04 DIAGNOSIS — N179 Acute kidney failure, unspecified: Secondary | ICD-10-CM

## 2024-08-04 DIAGNOSIS — D649 Anemia, unspecified: Secondary | ICD-10-CM | POA: Diagnosis not present

## 2024-08-04 DIAGNOSIS — S72012A Unspecified intracapsular fracture of left femur, initial encounter for closed fracture: Secondary | ICD-10-CM | POA: Diagnosis not present

## 2024-08-04 DIAGNOSIS — N189 Chronic kidney disease, unspecified: Secondary | ICD-10-CM

## 2024-08-04 LAB — URINALYSIS, ROUTINE W REFLEX MICROSCOPIC
Bilirubin Urine: NEGATIVE
Glucose, UA: NEGATIVE mg/dL
Hgb urine dipstick: NEGATIVE
Ketones, ur: NEGATIVE mg/dL
Leukocytes,Ua: NEGATIVE
Nitrite: NEGATIVE
Protein, ur: NEGATIVE mg/dL
Specific Gravity, Urine: 1.019 (ref 1.005–1.030)
pH: 5 (ref 5.0–8.0)

## 2024-08-04 LAB — CBC
HCT: 27.9 % — ABNORMAL LOW (ref 36.0–46.0)
Hemoglobin: 8.2 g/dL — ABNORMAL LOW (ref 12.0–15.0)
MCH: 26.5 pg (ref 26.0–34.0)
MCHC: 29.4 g/dL — ABNORMAL LOW (ref 30.0–36.0)
MCV: 90 fL (ref 80.0–100.0)
Platelets: 166 K/uL (ref 150–400)
RBC: 3.1 MIL/uL — ABNORMAL LOW (ref 3.87–5.11)
RDW: 17.2 % — ABNORMAL HIGH (ref 11.5–15.5)
WBC: 12.1 K/uL — ABNORMAL HIGH (ref 4.0–10.5)
nRBC: 0 % (ref 0.0–0.2)

## 2024-08-04 LAB — BASIC METABOLIC PANEL WITH GFR
Anion gap: 13 (ref 5–15)
BUN: 34 mg/dL — ABNORMAL HIGH (ref 8–23)
CO2: 19 mmol/L — ABNORMAL LOW (ref 22–32)
Calcium: 8.1 mg/dL — ABNORMAL LOW (ref 8.9–10.3)
Chloride: 106 mmol/L (ref 98–111)
Creatinine, Ser: 1.63 mg/dL — ABNORMAL HIGH (ref 0.44–1.00)
GFR, Estimated: 30 mL/min — ABNORMAL LOW (ref >60)
Glucose, Bld: 116 mg/dL — ABNORMAL HIGH (ref 70–99)
Potassium: 4.2 mmol/L (ref 3.5–5.1)
Sodium: 137 mmol/L (ref 135–145)

## 2024-08-04 LAB — HEMOGLOBIN AND HEMATOCRIT, BLOOD
HCT: 23.6 % — ABNORMAL LOW (ref 36.0–46.0)
Hemoglobin: 7.3 g/dL — ABNORMAL LOW (ref 12.0–15.0)

## 2024-08-04 MED ORDER — SODIUM CHLORIDE 0.9 % IV BOLUS
500.0000 mL | Freq: Once | INTRAVENOUS | Status: AC
  Administered 2024-08-04: 500 mL via INTRAVENOUS

## 2024-08-04 MED ORDER — SODIUM CHLORIDE 0.9 % IV SOLN: INTRAVENOUS | Status: DC

## 2024-08-04 NOTE — Progress Notes (Signed)
 PHARMACY NOTE:  ANTIMICROBIAL RENAL DOSAGE ADJUSTMENT  Current antimicrobial regimen includes a mismatch between antimicrobial dosage and estimated renal function.  As per policy approved by the Pharmacy & Therapeutics and Medical Executive Committees, the antimicrobial dosage will be adjusted accordingly.  Current antimicrobial dosage:  Ancef  1gm IV q8h x3 doses post-op  Indication: post-op prophylaxis  Renal Function:  Estimated Creatinine Clearance: 17.9 mL/min (A) (by C-G formula based on SCr of 1.5 mg/dL (H)). []      On intermittent HD, scheduled: []      On CRRT    Antimicrobial dosage has been changed to:  Ancef  1gm IV q12h x 2 doses post-op for 24h coverage  Additional comments:   Thank you for allowing pharmacy to be a part of this patient's care.  Rosaline Millet, [facility] 08/04/2024 4:52 AM

## 2024-08-04 NOTE — NC FL2 (Signed)
 [place]  MEDICAID FL2 LEVEL OF CARE FORM     IDENTIFICATION  Patient Name: Mandy Adams Birthdate: [birth year] Sex: female Admission Date (Current Location): 08/03/2024  [facility] and [place] Number:  Producer, television/film/video and Address:  [facility],  [address], [place] [address]      Provider Number: [id number]  Attending Physician Name and Address:  Verdene Purchase, MD  Relative Name and Phone Number:  son, Shandiin Eisenbeis @ [phone]    Current Level of Care: Hospital Recommended Level of Care: Skilled Nursing Facility Prior Approval Number:    Date Approved/Denied:   PASRR Number: [id number]  Discharge Plan: SNF    Current Diagnoses: Patient Active Problem List   Diagnosis Date Noted   Closed subcapital fracture of neck of femur, left, initial encounter (HCC) 08/03/2024   Atrophic vaginitis 08/03/2024   Adjustment disorder 08/03/2024   Gastro-esophageal reflux disease without esophagitis 08/03/2024   Essential hypertension 08/03/2024   Eczema 08/03/2024   Hardening of the aorta (main artery of the heart) 08/03/2024   Moderate dementia (HCC) 08/03/2024   Osteoarthritis 08/03/2024   Pure hypercholesterolemia 08/03/2024   Pressure injury of buttock, stage 2 (HCC) 08/03/2024   Stage 3 chronic kidney disease (HCC) 08/03/2024   Mild protein malnutrition 08/03/2024   Lactic acidosis 08/03/2024   Hypokalemia 04/30/2018   Community acquired pneumonia 04/30/2018   Leukocytosis 04/30/2018   AKI (acute kidney injury) 04/30/2018   Diaphragmatic hernia without obstruction and without gangrene     Orientation RESPIRATION BLADDER Height & Weight     Self, Situation  Normal Incontinent, External catheter (currently with purewick) Weight: 114 lb 3.2 oz (51.8 kg) Height:  5' (152.4 cm)  BEHAVIORAL SYMPTOMS/MOOD NEUROLOGICAL BOWEL NUTRITION STATUS      Continent Diet (heart healthy)  AMBULATORY STATUS COMMUNICATION OF NEEDS Skin   Extensive Assist  Verbally Other (Comment) (surgical incision only)                       Personal Care Assistance Level of Assistance  Bathing, Dressing Bathing Assistance: Limited assistance   Dressing Assistance: Limited assistance     Functional Limitations Info  Sight, Hearing, Speech Sight Info: Adequate Hearing Info: Impaired Speech Info: Adequate    SPECIAL CARE FACTORS FREQUENCY  PT (By licensed PT), OT (By licensed OT)     PT Frequency: 5x/wk OT Frequency: 5x/wk            Contractures Contractures Info: Not present    Additional Factors Info  Code Status, Allergies, Psychotropic Code Status Info: DNR Allergies Info: NKDA Psychotropic Info: see MAR         Current Medications (08/05/2024):  This is the current hospital active medication list Current Facility-Administered Medications  Medication Dose Route Frequency Provider Last Rate Last Admin   0.9 %  sodium chloride  infusion   Intravenous Continuous Krishnan, Gokul, MD 100 mL/hr at 08/05/24 0610 New Bag at 08/05/24 0610   acetaminophen  (TYLENOL ) tablet 325-650 mg  325-650 mg Oral Q6H PRN Jerri Kay HERO, MD   650 mg at 08/05/24 0044   alum & mag hydroxide-simeth (MAALOX/MYLANTA) 200-200-20 MG/5ML suspension 30 mL  30 mL Oral Q4H PRN Jerri Kay HERO, MD       aspirin  EC tablet 325 mg  325 mg Oral BID Jerri Kay HERO, MD   325 mg at 08/04/24 1718   docusate sodium  (COLACE) capsule 100 mg  100 mg Oral BID Jerri Kay HERO, MD   100  mg at 08/04/24 2220   feeding supplement (ENSURE PLUS HIGH PROTEIN) liquid 237 mL  237 mL Oral BID BM Celinda Alm Lot, MD   237 mL at 08/04/24 1349   HYDROcodone -acetaminophen  (NORCO) 7.5-325 MG per tablet 1-2 tablet  1-2 tablet Oral Daily PRN Jerri Kay HERO, MD       HYDROcodone -acetaminophen  (NORCO/VICODIN) 5-325 MG per tablet 1-2 tablet  1-2 tablet Oral BID PRN Jerri Kay HERO, MD   1 tablet at 08/05/24 0608   magnesium  citrate solution 1 Bottle  1 Bottle Oral Once PRN Jerri Kay HERO, MD        meclizine  (ANTIVERT ) tablet 25 mg  25 mg Oral Q8H PRN Celinda Alm Lot, MD       memantine  (NAMENDA ) tablet 5 mg  5 mg Oral QHS Celinda Alm Lot, MD   5 mg at 08/04/24 2220   menthol  (CEPACOL) lozenge 3 mg  1 lozenge Oral PRN Jerri Kay HERO, MD       Or   phenol (CHLORASEPTIC) mouth spray 1 spray  1 spray Mouth/Throat PRN Jerri Kay HERO, MD       methocarbamol  (ROBAXIN ) tablet 500 mg  500 mg Oral Q8H PRN Jerri Kay HERO, MD       Or   methocarbamol  (ROBAXIN ) injection 500 mg  500 mg Intravenous Q8H PRN Jerri Kay HERO, MD       mirtazapine  (REMERON ) tablet 15 mg  15 mg Oral QHS Celinda Alm Lot, MD   15 mg at 08/04/24 2220   morphine  (PF) 2 MG/ML injection 0.5-1 mg  0.5-1 mg Intravenous Q2H PRN Jerri Kay HERO, MD       ondansetron  (ZOFRAN ) tablet 4 mg  4 mg Oral Q6H PRN Jerri Kay HERO, MD   4 mg at 08/04/24 1011   Or   ondansetron  (ZOFRAN ) injection 4 mg  4 mg Intravenous Q6H PRN Jerri Kay HERO, MD       Oral care mouth rinse  15 mL Mouth Rinse PRN Celinda Alm Lot, MD       pantoprazole  (PROTONIX ) EC tablet 20 mg  20 mg Oral Daily Celinda Alm Lot, MD   20 mg at 08/04/24 9076   polyethylene glycol (MIRALAX  / GLYCOLAX ) packet 17 g  17 g Oral Daily PRN Jerri Kay HERO, MD       sertraline  (ZOLOFT ) tablet 50 mg  50 mg Oral Daily Celinda Alm Lot, MD   50 mg at 08/04/24 9076   sorbitol  70 % solution 30 mL  30 mL Oral Daily PRN Jerri Kay HERO, MD         Discharge Medications: Please see discharge summary for a list of discharge medications.  Relevant Imaging Results:  Relevant Lab Results:   Additional Information SS# [id number]  NORMAN ASPEN, LCSW

## 2024-08-04 NOTE — Evaluation (Signed)
 Occupational Therapy Evaluation Patient Details Name: Mandy Adams MRN: [id number] DOB: 1932-01-23 Today's Date: 08/04/2024   History of Present Illness   Pt is a 89 y/o female presenting after an unwitnessed fall at ALF. Imaging showed L femoral neck fx. Pt underwent L THA (anterior approach) on 9/22. PMH: AKI, CAP, dementia, HLD, HTN, leukocytosis, GERD, eczema, aortic atherosclerosis, CKD3     Clinical Impressions PTA, pt resides at [facility], typically ambulatory with a walker and requiring gradual increased assist for ADLs. Pt presents now with deficits in strength, balance, endurance and baseline cognitive deficits. Pt with mild pain during mobility; more so limited by dizziness and soft BP today (80s/50s throughout). Pt requires Mod A x 2 for safety with BSC/chair transfers using RW, Min A for UB ADL and Max A for LB ADLs. Pt's son at bedside, agreeable with recommendation for continued inpatient follow up therapy, <3 hours/day prior to return to ALF.     If plan is discharge home, recommend the following:   A lot of help with walking and/or transfers;Two people to help with walking and/or transfers;A lot of help with bathing/dressing/bathroom     Functional Status Assessment   Patient has had a recent decline in their functional status and demonstrates the ability to make significant improvements in function in a reasonable and predictable amount of time.     Equipment Recommendations   None recommended by OT     Recommendations for Other Services         Precautions/Restrictions   Precautions Precautions: Fall Restrictions Weight Bearing Restrictions Per Provider Order: Yes LLE Weight Bearing Per Provider Order: Weight bearing as tolerated     Mobility Bed Mobility Overal bed mobility: Needs Assistance Bed Mobility: Supine to Sit     Supine to sit: Mod assist, HOB elevated, Used rails     General bed mobility comments: good initiation of  LE to EOB, assist to lift trunk and scoot hips with bed pad    Transfers Overall transfer level: Needs assistance Equipment used: Rolling walker (2 wheels) Transfers: Sit to/from Stand, Bed to chair/wheelchair/BSC Sit to Stand: Mod assist     Step pivot transfers: Mod assist, +2 safety/equipment     General transfer comment: Mod A to stand from bedside and BSC, cues/assist for RW navigation, step sequencing. +2 for safey d/t soft BP/dizziness      Balance Overall balance assessment: Needs assistance, History of Falls Sitting-balance support: Feet supported, No upper extremity supported Sitting balance-Leahy Scale: Fair     Standing balance support: Bilateral upper extremity supported, During functional activity Standing balance-Leahy Scale: Poor                             ADL either performed or assessed with clinical judgement   ADL Overall ADL's : Needs assistance/impaired Eating/Feeding: Set up   Grooming: Set up;Sitting;Wash/dry face   Upper Body Bathing: Minimal assistance;Sitting   Lower Body Bathing: Maximal assistance;Sit to/from stand;Sitting/lateral leans   Upper Body Dressing : Set up;Sitting   Lower Body Dressing: Maximal assistance;Sit to/from stand;Sitting/lateral leans   Toilet Transfer: Moderate assistance;+2 for safety/equipment;Stand-pivot;BSC/3in1;Rolling walker (2 wheels) Toilet Transfer Details (indicate cue type and reason): Mod A x1 to [facility] with assist/cues to avoid pushing RW too far away, directional cues and difficulty stepping. Mod A x 2 for safey fortransfer from [facility] d/t soft BP and reported dizziness Toileting- Clothing Manipulation and Hygiene: Maximal assistance;Sit to/from stand;Sitting/lateral lean  Vision Ability to See in Adequate Light: 0 Adequate Patient Visual Report: No change from baseline Vision Assessment?: No apparent visual deficits     Perception         Praxis         Pertinent  Vitals/Pain Pain Assessment Pain Assessment: Faces Faces Pain Scale: Hurts a little bit Pain Location: LLE Pain Descriptors / Indicators: Grimacing Pain Intervention(s): Monitored during session, Limited activity within patient's tolerance     Extremity/Trunk Assessment Upper Extremity Assessment Upper Extremity Assessment: Generalized weakness;Right hand dominant   Lower Extremity Assessment Lower Extremity Assessment: Defer to PT evaluation   Cervical / Trunk Assessment Cervical / Trunk Assessment: Normal   Communication Communication Communication: No apparent difficulties   Cognition Arousal: Alert Behavior During Therapy: WFL for tasks assessed/performed Cognition: History of cognitive impairments             OT - Cognition Comments: hx of dementia, pleasant. memory deficits w/ unawareness of situation (fall and broken LE).                 Following commands: Impaired Following commands impaired: Follows one step commands with increased time, Follows multi-step commands inconsistently     Cueing  General Comments   Cueing Techniques: Verbal cues;Gestural cues  Son, [name], at bedside and supportive. BP assessed throughout with readings 80s/50s to 90s/60s. reporting dizziness but BP readings remained the same. elevated feet in chair, reclined back some and placed SCDs on. Notified RN and NT. SpO2 trialed on RA down to 80s so required replacement of O2 to return to 90s   Exercises     Shoulder Instructions      Home Living Family/patient expects to be discharged to:: Assisted living                             Home Equipment: Agricultural consultant (2 wheels)   Additional Comments: From [facility] ALF      Prior Functioning/Environment Prior Level of Function : Needs assist;History of Falls (last six months)             Mobility Comments: use of walker to/from dining hall- son reports pt with difficulty doing this w/ hx of issues with  dizziness ADLs Comments: Increasing assist for ADLs, assist for showers, dressing    OT Problem List: Decreased strength;Decreased activity tolerance;Impaired balance (sitting and/or standing);Decreased cognition;Decreased safety awareness;Decreased knowledge of use of DME or AE;Decreased knowledge of precautions;Pain   OT Treatment/Interventions: Self-care/ADL training;Therapeutic exercise;Energy conservation;DME and/or AE instruction;Therapeutic activities;Patient/family education;Balance training      OT Goals(Current goals can be found in the care plan section)   Acute Rehab OT Goals Patient Stated Goal: son anticipated need for rehab OT Goal Formulation: With patient/family Time For Goal Achievement: 08/18/24 Potential to Achieve Goals: Good ADL Goals Pt Will Transfer to Toilet: with contact guard assist;stand pivot transfer;bedside commode Pt Will Perform Toileting - Clothing Manipulation and hygiene: with min assist;sit to/from stand;sitting/lateral leans Additional ADL Goal #1: Pt to complete bed mobility with Supervision in prep for EOB/OOB ADLs   OT Frequency:  Min 2X/week    Co-evaluation              AM-PAC OT 6 Clicks Daily Activity     Outcome Measure Help from another person eating meals?: A Little Help from another person taking care of personal grooming?: A Little Help from another person toileting, which includes using toliet, bedpan, or urinal?:  A Lot Help from another person bathing (including washing, rinsing, drying)?: A Lot Help from another person to put on and taking off regular upper body clothing?: A Little Help from another person to put on and taking off regular lower body clothing?: A Lot 6 Click Score: 15   End of Session Equipment Utilized During Treatment: Gait belt;Rolling walker (2 wheels);Oxygen Nurse Communication: Mobility status;Other (comment) (BP)  Activity Tolerance: Treatment limited secondary to medical complications  (Comment) Patient left: in chair;with call bell/phone within reach;with chair alarm set;with family/visitor present  OT Visit Diagnosis: Other abnormalities of gait and mobility (R26.89);Unsteadiness on feet (R26.81);Muscle weakness (generalized) (M62.81)                Time: 9194-9157 OT Time Calculation (min): 37 min Charges:  OT General Charges $OT Visit: 1 Visit OT Evaluation $OT Eval Moderate Complexity: 1 Mod OT Treatments $Therapeutic Activity: 8-22 mins  Mliss NOVAK, OTR/L Acute Rehab Services Office: [phone]   Mliss Fish 08/04/2024, 9:03 AM

## 2024-08-04 NOTE — Plan of Care (Signed)
  Problem: Pain Managment: Goal: General experience of comfort will improve and/or be controlled Outcome: Progressing   Problem: Elimination: Goal: Will not experience complications related to urinary retention 08/04/2024 0611 by Herschel Countryman, RN Outcome: Progressing 08/04/2024 0610 by Herschel Countryman, RN Outcome: Progressing   Problem: Coping: Goal: Level of anxiety will decrease Outcome: Progressing   Problem: Activity: Goal: Risk for activity intolerance will decrease Outcome: Progressing

## 2024-08-04 NOTE — TOC Initial Note (Signed)
 Transition of Care [facility]) - Initial/Assessment Note    Patient Details  Name: Mandy Adams MRN: [id number] Date of Birth: November 09, 1932  Transition of Care [facility]) CM/SW Contact:    NORMAN ASPEN, LCSW Phone Number: 08/04/2024, 1:37 PM  Clinical Narrative:                  Met with pt and spoke with son, [name], via phone to introduce CSW role with dc planning needs.  Pt is oriented to self but some confusion about her overall situation.  She asks that I speak with son.  Have reviewed with son the therapy recommendations for SNF rehab.  He is in agreement with this plan as he is doubtful [facility] could manage her current assistance needs.  Will begin SNF bed search process and then insurance auth once bed confirmed.   Barriers to Discharge: English as a second language teacher, Continued Medical Work up, SNF Pending bed offer   Patient Goals and CMS Choice Patient states their goals for this hospitalization and ongoing recovery are:: return to ALF following SNF rehab          Expected Discharge Plan and Services In-house Referral: Clinical Social Work   Post Acute Care Choice: Skilled Nursing Facility Living arrangements for the past 2 months: Assisted Living Facility Transport planner)                 DME Arranged: N/A DME Agency: NA                  Prior Living Arrangements/Services Living arrangements for the past 2 months: Assisted Living Facility Transport planner) Lives with:: Facility Resident Patient language and need for interpreter reviewed:: Yes Do you feel safe going back to the place where you live?: Yes      Need for Family Participation in Patient Care: No (Comment) Care giver support system in place?: Yes (comment)   Criminal Activity/Legal Involvement Pertinent to Current Situation/Hospitalization: No - Comment as needed  Activities of Daily Living   ADL Screening (condition at time of admission) Independently performs ADLs?: No Does the patient have a  NEW difficulty with bathing/dressing/toileting/self-feeding that is expected to last >3 days?: Yes (Initiates electronic notice to provider for possible OT consult) Does the patient have a NEW difficulty with getting in/out of bed, walking, or climbing stairs that is expected to last >3 days?: Yes (Initiates electronic notice to provider for possible PT consult) Does the patient have a NEW difficulty with communication that is expected to last >3 days?: No Is the patient deaf or have difficulty hearing?: No Does the patient have difficulty seeing, even when wearing glasses/contacts?: No Does the patient have difficulty concentrating, remembering, or making decisions?: Yes  Permission Sought/Granted Permission sought to share information with : Family Supports Permission granted to share information with : Yes, Verbal Permission Granted  Share Information with NAME: son, Denym Christenberry @ [phone]           Emotional Assessment Appearance:: Appears stated age Attitude/Demeanor/Rapport: Gracious Affect (typically observed): Accepting Orientation: : Oriented to Self, Oriented to Situation Alcohol  / Substance Use: Not Applicable Psych Involvement: No (comment)  Admission diagnosis:  Fall, initial encounter [W19.XXXA] Closed displaced fracture of left femoral neck (HCC) [S72.002A] Closed left hip fracture (HCC) [S72.002A] Patient Active Problem List   Diagnosis Date Noted   Closed subcapital fracture of neck of femur, left, initial encounter (HCC) 08/03/2024   Atrophic vaginitis 08/03/2024   Adjustment disorder 08/03/2024   Gastro-esophageal reflux disease without  esophagitis 08/03/2024   Essential hypertension 08/03/2024   Eczema 08/03/2024   Hardening of the aorta (main artery of the heart) 08/03/2024   Moderate dementia (HCC) 08/03/2024   Osteoarthritis 08/03/2024   Pure hypercholesterolemia 08/03/2024   Pressure injury of buttock, stage 2 (HCC) 08/03/2024   Stage 3 chronic  kidney disease (HCC) 08/03/2024   Mild protein malnutrition 08/03/2024   Lactic acidosis 08/03/2024   Hypokalemia 04/30/2018   Community acquired pneumonia 04/30/2018   Leukocytosis 04/30/2018   AKI (acute kidney injury) 04/30/2018   Diaphragmatic hernia without obstruction and without gangrene    PCP:  Okey Carlin Redbird, MD Pharmacy:   [facility] [phone] - [place], [place] - [address] [place] [address] Phone: [phone] Fax: [phone]     Social Drivers of Health (SDOH) Social History: SDOH Screenings   Social Connections: Unknown (03/27/2022)   Received from [facility]  Tobacco Use: Low Risk  (08/03/2024)   SDOH Interventions:     Readmission Risk Interventions    08/04/2024    1:35 PM  Readmission Risk Prevention Plan  Transportation Screening Complete  PCP or Specialist Appt within 5-7 Days Complete  Home Care Screening Complete  Medication Review (RN CM) Complete

## 2024-08-04 NOTE — Progress Notes (Addendum)
 Subjective: 1 Day Post-Op Procedure(s) (LRB): ARTHROPLASTY, HIP, TOTAL, ANTERIOR APPROACH (Left) Patient reports pain as mild.    Objective: Vital signs in last 24 hours: Temp:  [97.7 F (36.5 C)-98.9 F (37.2 C)] 97.9 F (36.6 C) (09/23 0537) Pulse Rate:  [74-110] 110 (09/23 0537) Resp:  [10-28] 18 (09/23 0537) BP: (92-163)/(55-148) 115/68 (09/23 0537) SpO2:  [88 %-100 %] 99 % (09/23 0537) Weight:  [54 kg] 54 kg (09/22 1254)  Intake/Output from previous day: 09/22 0701 - 09/23 0700 In: 1280 [P.O.:180; I.V.:800; IV Piggyback:300] Out: 250 [Urine:150; Blood:100] Intake/Output this shift: No intake/output data recorded.  Recent Labs    08/03/24 0910 08/03/24 1848 08/04/24 0341  HGB 10.8* 8.9* 8.2*   Recent Labs    08/03/24 1848 08/04/24 0341  WBC 15.5* 12.1*  RBC 3.39* 3.10*  HCT 30.0* 27.9*  PLT 197 166   Recent Labs    08/03/24 1848 08/04/24 0341  NA 137 137  K 4.8 4.2  CL 104 106  CO2 20* 19*  BUN 31* 34*  CREATININE 1.50* 1.63*  GLUCOSE 138* 116*  CALCIUM  8.4* 8.1*   No results for input(s): LABPT, INR in the last 72 hours.  Neurovascular intact Sensation intact distally Intact pulses distally Dorsiflexion/Plantar flexion intact Incision: dressing C/D/I No cellulitis present Compartment soft   Assessment/Plan: 1 Day Post-Op Procedure(s) (LRB): ARTHROPLASTY, HIP, TOTAL, ANTERIOR APPROACH (Left) Up with therapy ABLA- mild and stable.  Will defer treatment to primary team WBAT LLE ASA/SCDs for dvt ppx-rx on chart Norco for pain control- rx on chart F/u with ortho two weeks post-op       Ronal LITTIE Grave 08/04/2024, 7:30 AM

## 2024-08-04 NOTE — Evaluation (Signed)
 Physical Therapy Evaluation Patient Details Name: Mandy Adams MRN: [id number] DOB: 02-20-1932 Today's Date: 08/04/2024  History of Present Illness  Pt is a 89 y/o female presenting after an unwitnessed fall at ALF. Imaging showed L femoral neck fx. Pt underwent L THA (anterior approach) on 9/22. PMH: AKI, CAP, dementia, HLD, HTN, leukocytosis, GERD, eczema, aortic atherosclerosis, CKD3  Clinical Impression  Pt admitted with above diagnosis.  Pt pleasant and and cooperative, able to follow one step commands. Pt is ambulatory with RW and ALF at her baseline.  Pt receiving fluid bolus however still with slightly soft BP, dizzy sitting EOB (BP 100/62), assisted pt back to supine with pt reporting resolution of symptoms. RN present  Patient will benefit from continued inpatient follow up therapy, <3 hours/day   Pt currently with functional limitations due to the deficits listed below (see PT Problem List). Pt will benefit from acute skilled PT to increase their independence and safety with mobility to allow discharge.           If plan is discharge home, recommend the following: A little help with walking and/or transfers;A little help with bathing/dressing/bathroom;Help with stairs or ramp for entrance;Assist for transportation;Supervision due to cognitive status   Can travel by private vehicle        Equipment Recommendations None recommended by PT  Recommendations for Other Services       Functional Status Assessment Patient has had a recent decline in their functional status and demonstrates the ability to make significant improvements in function in a reasonable and predictable amount of time.     Precautions / Restrictions Precautions Precautions: Fall Restrictions Weight Bearing Restrictions Per Provider Order: No LLE Weight Bearing Per Provider Order: Weight bearing as tolerated      Mobility  Bed Mobility Overal bed mobility: Needs Assistance Bed Mobility: Supine to  Sit, Sit to Supine     Supine to sit: Min assist, +2 for safety/equipment Sit to supine: Mod assist, +2 for safety/equipment   General bed mobility comments: good initiation of LE to EOB, assist to lift trunk and scoot hips to EOB with bed pad; assist to control trunk descent and lift bil LEs on to bed    Transfers   Equipment used: Rolling walker (2 wheels)               General transfer comment: NT d/t dizziness. pt receiving bolus with BP improved to 100/62 sitting EOB    Ambulation/Gait               General Gait Details: deferred attempt d/t dizziness  Stairs            Wheelchair Mobility     Tilt Bed    Modified Rankin (Stroke Patients Only)       Balance Overall balance assessment: Needs assistance, History of Falls Sitting-balance support: No upper extremity supported, Feet supported Sitting balance-Leahy Scale: Fair         Standing balance comment: NT                             Pertinent Vitals/Pain Pain Assessment Pain Assessment: Faces Faces Pain Scale: Hurts a little bit Pain Location: L hip Pain Descriptors / Indicators: Grimacing Pain Intervention(s): Limited activity within patient's tolerance, Monitored during session, Premedicated before session, Repositioned    Home Living Family/patient expects to be discharged to:: Skilled nursing facility  Home Equipment: Agricultural consultant (2 wheels) Additional Comments: From [facility]    Prior Function Prior Level of Function : Needs assist;History of Falls (last six months)             Mobility Comments: use of walker to/from dining hall       Extremity/Trunk Assessment   Upper Extremity Assessment Upper Extremity Assessment: Generalized weakness    Lower Extremity Assessment Lower Extremity Assessment: Generalized weakness;LLE deficits/detail LLE Deficits / Details: AAROM WFL LLE: Unable to fully assess due to pain        Communication   Communication Communication: No apparent difficulties    Cognition Arousal: Alert Behavior During Therapy: WFL for tasks assessed/performed   PT - Cognitive impairments: History of cognitive impairments                           Following commands impaired: Follows one step commands with increased time, Follows multi-step commands inconsistently     Cueing Cueing Techniques: Verbal cues, Gestural cues     General Comments      Exercises     Assessment/Plan    PT Assessment Patient needs continued PT services  PT Problem List Decreased strength;Decreased activity tolerance;Decreased balance;Decreased mobility;Decreased safety awareness;Decreased cognition       PT Treatment Interventions DME instruction;Therapeutic activities;Therapeutic exercise;Functional mobility training;Gait training;Patient/family education    PT Goals (Current goals can be found in the Care Plan section)  Acute Rehab PT Goals PT Goal Formulation: With patient Time For Goal Achievement: 08/11/24 Potential to Achieve Goals: Good    Frequency       Co-evaluation               AM-PAC PT 6 Clicks Mobility  Outcome Measure Help needed turning from your back to your side while in a flat bed without using bedrails?: A Little Help needed moving from lying on your back to sitting on the side of a flat bed without using bedrails?: A Little Help needed moving to and from a bed to a chair (including a wheelchair)?: A Little Help needed standing up from a chair using your arms (e.g., wheelchair or bedside chair)?: A Little Help needed to walk in hospital room?: A Little Help needed climbing 3-5 steps with a railing? : A Little 6 Click Score: 18    End of Session     Patient left: with call bell/phone within reach Nurse Communication: Mobility status PT Visit Diagnosis: Other abnormalities of gait and mobility (R26.89);Muscle weakness (generalized) (M62.81)     Time: 1203-1223 PT Time Calculation (min) (ACUTE ONLY): 20 min   Charges:   PT Evaluation $PT Eval Low Complexity: 1 Low   PT General Charges $$ ACUTE PT VISIT: 1 Visit         Ulanda Tackett, PT  Acute Rehab Dept ([facility]) [phone]  08/04/2024   [facility] 08/04/2024, 1:15 PM

## 2024-08-04 NOTE — TOC PASRR Note (Signed)
 30 Day PASRR Note   Patient Details  Name: Mandy Adams Date of Birth: [birth year]   [facility]) CM/SW Contact:    NORMAN ASPEN, LCSW Phone Number: 08/04/2024, 1:45 PM  To Whom It May Concern:  Please be advised that this patient will require a short-term nursing home stay - anticipated 30 days or less for rehabilitation and strengthening.   The plan is for return home.

## 2024-08-04 NOTE — Progress Notes (Addendum)
 [facility] PROGRESS NOTE   Mandy Adams FMW:[id number] DOB: 04-06-1932 DOA: 08/03/2024  PCP: Okey Carlin Redbird, MD  Brief History: 89 y.o. female with medical history significant of AKI, CAP, dementia, hyperlipidemia, hypertension, leukocytosis, GERD, eczema, aortic atherosclerosis, stage IIIa CKD who was brought to the emergency department due to having an unwitnessed fall at her facility injuring her left hip area with inability to bear weight on her LLE.  Thought to be a mechanical fall.  She was found to have left hip fracture.  She was hospitalized for further management  Consultants: Orthopedics  Procedures: Cemented hemiarthroplasty left femoral neck fracture on 9/22    Subjective/Interval History: Patient is pleasantly confused.  She does not appear to be in any pain or discomfort.  She denies any pain while lying still.  Denies any shortness of breath.    Assessment/Plan:  Left hip fracture Status post surgery on 9/22.  Orthopedics following.  Pain appears to be reasonably well-controlled. Noted to be on aspirin  twice a day for DVT prophylaxis. PT and OT eval is pending.  Acute kidney injury/chronic kidney disease stage IIIa Creatinine was 1.24 in February.  Came in with a creatinine of 1.23.  Noted to be 1.63 this morning.  Increase in BUN also noted.  Appears to be experiencing some acute kidney injury probably due to hypovolemia.  Not noted to be on any diuretics.  Will give her normal saline bolus due to hypotension and then continue with maintenance IV fluids for 24 hours.  Recheck labs in the morning.  Avoid nephrotoxic agents.  Normocytic anemia Baseline hemoglobin seems to be around 11.  Drop in hemoglobin most likely due to fracture and surgery.  Transfuse for less than 7.  Check anemia panel.  And hemoglobin.  Essential hypertension Low blood pressures noted this morning.  No noted to be on any antihypertensives.  It looks like her metoprolol was  recently discontinued by outpatient providers.  Lactic acidosis This was minimally elevated.  Improvement noted.  No further workup anticipated at this time.  Leukocytosis Most likely reactive.  Improvement noted today.  Diaphragmatic hernia without obstruction and without gangrene Seems to be asymptomatic.  Probably a chronic issue.  Continue PPI. History of dementia Continue memantine   History of adjustment disorder Continue her psychotropic agents.  L1 vertebral body compression fracture Age indeterminate L1 vertebral body compression fracture noted incidentally on CT scan.  Noted to be asymptomatic.   DVT Prophylaxis: Aspirin  twice a day Code Status: Informed by nursing staff that she had a DNR band which was also confirmed by patient's son.  Changed to DNR. Family Communication: No family at bedside Disposition Plan: To be determined  Status is: Inpatient Remains inpatient appropriate because: Hip fracture requiring surgery.      Medications: Scheduled:  acetaminophen   500 mg Oral Q6H   aspirin  EC  325 mg Oral BID   docusate sodium   100 mg Oral BID   feeding supplement  237 mL Oral BID BM   memantine   5 mg Oral QHS   mirtazapine   15 mg Oral QHS   pantoprazole   20 mg Oral Daily   sertraline   50 mg Oral Daily   Continuous:  sodium chloride  75 mL/hr at 08/03/24 1842   lactated ringers      sodium chloride      PRN:acetaminophen , alum & mag hydroxide-simeth, HYDROcodone -acetaminophen , HYDROcodone -acetaminophen , magnesium  citrate, meclizine , menthol  **OR** phenol, methocarbamol  **OR** methocarbamol  (ROBAXIN ) injection, morphine  injection, ondansetron  **OR** ondansetron  (ZOFRAN ) IV, mouth rinse, polyethylene glycol, sorbitol   Antibiotics: Anti-infectives (From admission, onward)    Start     Dose/Rate Route Frequency Ordered Stop   08/03/24 2000  ceFAZolin  (ANCEF ) IVPB 1 g/50 mL premix  Status:  Discontinued        1 g 100 mL/hr over 30 Minutes Intravenous Every 8  hours 08/03/24 1754 08/04/24 0453   08/03/24 1557  vancomycin  (VANCOCIN ) powder  Status:  Discontinued          As needed 08/03/24 1557 08/03/24 1619   08/03/24 1245  ceFAZolin  (ANCEF ) IVPB 2g/100 mL premix        2 g 200 mL/hr over 30 Minutes Intravenous On call to O.R. 08/03/24 1241 08/03/24 1434       Objective:  Vital Signs  Vitals:   08/03/24 2051 08/04/24 0229 08/04/24 0537 08/04/24 0915  BP: 127/64 (!) 103/55 115/68 (!) 80/56  Pulse: (!) 101 (!) 102 (!) 110   Resp: 17 18 18    Temp: 98.9 F (37.2 C) 98.4 F (36.9 C) 97.9 F (36.6 C)   TempSrc:      SpO2: 98% 96% 99%   Weight:      Height:        Intake/Output Summary (Last 24 hours) at 08/04/2024 0935 Last data filed at 08/04/2024 0610 Gross per 24 hour  Intake 1660 ml  Output 450 ml  Net 1210 ml   Filed Weights   08/03/24 0858 08/03/24 1254  Weight: 54 kg 54 kg    General appearance: Awake alert.  In no distress.  Distracted Resp: Clear to auscultation bilaterally.  Normal effort Cardio: S1-S2 is normal regular.  No S3-S4.  No rubs murmurs or bruit GI: Abdomen is soft.  Nontender nondistended.  Bowel sounds are present normal.  No masses organomegaly Extremities: No edema.  Restricted range of motion of lower extremities Neurologic: No focal neurological deficits.    Lab Results:  Data Reviewed: I have personally reviewed following labs and reports of the imaging studies  CBC: Recent Labs  Lab 08/03/24 0910 08/03/24 1848 08/04/24 0341  WBC 19.1* 15.5* 12.1*  HGB 10.8* 8.9* 8.2*  HCT 35.3* 30.0* 27.9*  MCV 86.5 88.5 90.0  PLT 327 197 166    Basic Metabolic Panel: Recent Labs  Lab 08/03/24 0937 08/03/24 1848 08/04/24 0341  NA 141 137 137  K 4.1 4.8 4.2  CL 108 104 106  CO2 18* 20* 19*  GLUCOSE 102* 138* 116*  BUN 30* 31* 34*  CREATININE 1.23* 1.50* 1.63*  CALCIUM  8.3* 8.4* 8.1*  MG 2.1  --   --     GFR: Estimated Creatinine Clearance: 16.5 mL/min (A) (by C-G formula based on SCr  of 1.63 mg/dL (H)).  Liver Function Tests: Recent Labs  Lab 08/03/24 0937  AST 27  ALT 11  ALKPHOS 75  BILITOT 0.5  PROT 6.0*  ALBUMIN  3.4*    Recent Labs  Lab 08/03/24 0937  LIPASE 14   Cardiac Enzymes: Recent Labs  Lab 08/03/24 0937  CKTOTAL 63    CBG: Recent Labs  Lab 08/03/24 0919  GLUCAP 105*    Radiology Studies: DG Pelvis Portable Result Date: 08/03/2024 EXAM: 1 or 2 VIEW(S) XRAY OF THE PELVIS 08/03/2024 04:54:00 PM COMPARISON: Earlier today. CLINICAL HISTORY: Deficient knowledge of open reduction and internal (ORIF) fixation of hip [id number]. Status post left arthoplasty. FINDINGS: BONES AND JOINTS: Left hip arthroplasty in place. No periprosthetic lucency or fracture. SOFT TISSUES: Subcutaneous soft tissue edema and emphysema consistent with postsurgical changes. IMPRESSION: 1.  Left hip arthroplasty without immediate post-operative complication. Electronically signed by: Andrea Gasman MD 08/03/2024 06:08 PM EDT RP Workstation: [id number]   DG HIP UNILAT WITH PELVIS 1V LEFT Result Date: 08/03/2024 EXAM: 1 VIEW XRAY OF THE LEFT HIP 08/03/2024 03:50:00 PM COMPARISON: 08/03/24 CLINICAL HISTORY: [id number] Surgery, elective [id number]. FINDINGS: BONES AND JOINTS: Seven fluoroscopic spot views of the pelvis and left hip submitted from the operating room. Sequential images during left hip arthroplasty. Fluoro time: 3 sec, Fluoro dose: .2068 mGy. IMPRESSION: 1. Procedural fluoroscopy during left hip arthroplasty . Electronically signed by: Andrea Gasman MD 08/03/2024 06:06 PM EDT RP Workstation: [id number]   DG C-Arm 1-60 Min-No Report Result Date: 08/03/2024 Fluoroscopy was utilized by the requesting physician.  No radiographic interpretation.   DG C-Arm 1-60 Min-No Report Result Date: 08/03/2024 Fluoroscopy was utilized by the requesting physician.  No radiographic interpretation.   CT HEAD WO CONTRAST Result Date: 08/03/2024 CLINICAL DATA:  Head trauma, fall, injury EXAM:  CT HEAD WITHOUT CONTRAST TECHNIQUE: Contiguous axial images were obtained from the base of the skull through the vertex without intravenous contrast. RADIATION DOSE REDUCTION: This exam was performed according to the departmental dose-optimization program which includes automated exposure control, adjustment of the mA and/or kV according to patient size and/or use of iterative reconstruction technique. COMPARISON:  August 02, 2020 MRI FINDINGS: Brain: Nonspecific periventricular and subcortical white matter hypodensities, likely secondary to chronic microvascular disease. No acute intracranial hemorrhage. Diffuse cerebral atrophy. No midline shift. Basal cisterns are patent. Vascular: Unremarkable. Skull: No acute findings. Sinuses/Orbits: Opacification of the right sphenoid sinus. Other: None. IMPRESSION: No acute intracranial pathology. Chronic white matter changes and diffuse cerebral atrophy. Opacification of the right sphenoid sinus. Electronically Signed   By: Michaeline Blanch M.D.   On: 08/03/2024 12:07   CT CHEST ABDOMEN PELVIS WO CONTRAST Result Date: 08/03/2024 CLINICAL DATA:  Blunt poly trauma, fall, injury EXAM: CT CHEST, ABDOMEN AND PELVIS WITHOUT CONTRAST TECHNIQUE: Multidetector CT imaging of the chest, abdomen and pelvis was performed following the standard protocol without IV contrast. RADIATION DOSE REDUCTION: This exam was performed according to the departmental dose-optimization program which includes automated exposure control, adjustment of the mA and/or kV according to patient size and/or use of iterative reconstruction technique. COMPARISON:  Same day additional trauma studies and priors FINDINGS: Limited evaluation of the solid organs and vasculature due to lack of IV contrast. CT CHEST FINDINGS Cardiovascular: Normal caliber aorta. Scattered aortic calcifications. No pericardial effusion. Mediastinum/Nodes: Large left-sided diaphragmatic hernia containing a majority of the stomach and  multiple loops of large bowel with chronic organo-axial rotation of the stomach and mass effect on the adjacent heart and lungs. No lymphadenopathy. Lungs/Pleura: Similar appearance of biapical pleuroparenchymal scarring and calcified sub pleural plaques. Chronic left lower lobe atelectasis/consolidation related to large left diaphragmatic hernia. Right basilar atelectasis. No pleural effusions or pneumothorax. Musculoskeletal: Age-indeterminate L1 vertebral body compression fracture. CT ABDOMEN PELVIS FINDINGS Hepatobiliary: Unremarkable. Pancreas: Unremarkable. Spleen: Unremarkable. Adrenals/Urinary Tract: Adrenal glands are unremarkable. No hydronephrosis or nephrolithiasis. Stomach/Bowel: No evidence of bowel obstruction or inflammation. Colonic diverticulosis. Large left-sided diaphragmatic hernia as described above. Vascular/Lymphatic: Normal caliber abdominal aorta. Reproductive: Status post hysterectomy. No adnexal masses. Other: No free air or free fluid. Musculoskeletal: Age-indeterminate L1 vertebral body compression fracture. Mildly displaced and angulated left femoral neck fracture. Multilevel degenerative vertebral changes. IMPRESSION: Limited evaluation of the solid organs and vasculature without IV contrast. 1.  Redemonstration of left femoral neck fracture. 2.  Age indeterminate L1 vertebral body compression fracture.  3. Unchanged large, chronic left-sided diaphragmatic hernia with organoaxial rotation of the stomach and mass effect on the adjacent lung and mediastinum. No evidence of bowel obstruction. Electronically Signed   By: Michaeline Blanch M.D.   On: 08/03/2024 12:03   CT CERVICAL SPINE WO CONTRAST Result Date: 08/03/2024 CLINICAL DATA:  Poly trauma, blood. Unwitnessed fall. History of hypertension and dementia. EXAM: CT CERVICAL SPINE WITHOUT CONTRAST TECHNIQUE: Multidetector CT imaging of the cervical spine was performed without intravenous contrast. Multiplanar CT image reconstructions  were also generated. RADIATION DOSE REDUCTION: This exam was performed according to the departmental dose-optimization program which includes automated exposure control, adjustment of the mA and/or kV according to patient size and/or use of iterative reconstruction technique. COMPARISON:  Chest CT 04/30/2018 FINDINGS: Alignment: 3 mm of anterolisthesis at C3-4 appears degenerative. Skull base and vertebrae: No evidence of acute fracture or traumatic subluxation. There is evidence for interbody and at least partial interfacetal ankylosis at the C2-3 and C4-5 levels. Mild asymmetric widening of the left C3-4 facet joint. Soft tissues and spinal canal: No prevertebral fluid or swelling. No visible canal hematoma. Disc levels: Multilevel spondylosis with disc space narrowing, uncinate spurring and facet hypertrophy. As above, interbody and at least partial interfacetal ankylosis at the C2-3 and C4-5 levels. Degenerative appearing anterolisthesis at C3-4 with resulting mild spinal stenosis and mild to moderate foraminal narrowing bilaterally. Upper chest: Chest CT findings dictated separately. Other: Bilateral carotid atherosclerosis. IMPRESSION: 1. No evidence of acute cervical spine fracture, traumatic subluxation or static signs of instability. 2. Multilevel cervical spondylosis as described with degenerative anterolisthesis at C3-4. 3. 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 Electronically Signed   By: Elsie Perone M.D.   On: 08/03/2024 11:43   DG Hip Unilat W or Wo Pelvis 2-3 Views Left Result Date: 08/03/2024 CLINICAL DATA:  Fall, left leg pain. EXAM: DG HIP (WITH OR WITHOUT PELVIS) 2-3V LEFT COMPARISON:  None Available. FINDINGS: Displaced left femoral neck fracture with superior displacement of the distal fracture fragment and varus angulation. No additional evidence of an acute fracture. Degenerative changes in the lower lumbosacral spine are incidentally imaged. IMPRESSION:  Left femoral neck fracture. Electronically Signed   By: Newell Eke M.D.   On: 08/03/2024 10:24   DG Chest Port 1 View Result Date: 08/03/2024 CLINICAL DATA:  Fall, left leg pain. EXAM: PORTABLE CHEST 1 VIEW COMPARISON:  01/02/2024 and CT chest 04/30/2018. FINDINGS: Trachea is midline. Heart is at the upper limits of normal in size to mildly enlarged, stable. Thoracic aorta is calcified. Very large hiatal hernia in the lower left hemithorax. Lungs are clear. No pleural fluid. IMPRESSION: 1. No acute findings. 2. Large hiatal hernia. Electronically Signed   By: Newell Eke M.D.   On: 08/03/2024 10:24       LOS: 1 day   Joette Pebbles  [facility] Pager on [email or web address]  08/04/2024, 9:35 AM

## 2024-08-04 NOTE — Progress Notes (Signed)
 Initial Nutrition Assessment  INTERVENTION:   -Ensure Plus High Protein po BID, each supplement provides 350 kcal and 20 grams of protein.   -Needs updated weight for admission  NUTRITION DIAGNOSIS:   Increased nutrient needs related to post-op healing, hip fracture as evidenced by estimated needs.  GOAL:   Patient will meet greater than or equal to 90% of their needs  MONITOR:   PO intake, Supplement acceptance  REASON FOR ASSESSMENT:   Consult Hip fracture protocol  ASSESSMENT:   89 y.o. female with medical history significant of AKI, CAP, dementia, hyperlipidemia, hypertension, leukocytosis, GERD, eczema, aortic atherosclerosis, stage IIIa CKD who was brought to the emergency department due to having an unwitnessed fall at her facility injuring her left hip area with inability to bear weight on her LLE.  Thought to be a mechanical fall.  She was found to have left hip fracture.  9/22: s/p ARTHROPLASTY, HIP, TOTAL   Patient working with therapies at time of visit, will attempt to gather history at a later time. Per chart review, pt with dementia and alert/oriented x 2.  Currently consuming 30% of meals. Ensure supplements have been ordered and pt accepting. Will continue to aid recovery.  Per weight records, no weight has been obtained since 2022. Needs updated weight.   Medications: Colace, Remeron , Zofran   Labs reviewed.  NUTRITION - FOCUSED PHYSICAL EXAM:  Unable to complete, working with therapy  Diet Order:   Diet Order             Diet Heart Room service appropriate? Yes; Fluid consistency: Thin  Diet effective now                   EDUCATION NEEDS:   Not appropriate for education at this time  Skin:  Skin Assessment: Skin Integrity Issues: Skin Integrity Issues:: Incisions, Other (Comment) Incisions: 9/22 left hip Other: left buttocks wound  Last BM:  PTA  Height:   Ht Readings from Last 1 Encounters:  08/03/24 5' (1.524 m)     Weight:   Wt Readings from Last 1 Encounters:  08/03/24 54 kg    BMI:  Body mass index is 23.25 kg/m.  Estimated Nutritional Needs:   Kcal:  1350-1550  Protein:  70-85g  Fluid:  1.5L/day  Morna Lee, MS, RD, LDN Inpatient Clinical Dietitian Contact via Secure chat

## 2024-08-05 DIAGNOSIS — I959 Hypotension, unspecified: Secondary | ICD-10-CM | POA: Diagnosis not present

## 2024-08-05 DIAGNOSIS — D649 Anemia, unspecified: Secondary | ICD-10-CM | POA: Diagnosis not present

## 2024-08-05 DIAGNOSIS — N189 Chronic kidney disease, unspecified: Secondary | ICD-10-CM | POA: Diagnosis not present

## 2024-08-05 DIAGNOSIS — N179 Acute kidney failure, unspecified: Secondary | ICD-10-CM | POA: Diagnosis not present

## 2024-08-05 LAB — IRON AND TIBC
Iron: 17 ug/dL — ABNORMAL LOW (ref 28–170)
Saturation Ratios: 6 % — ABNORMAL LOW (ref 10.4–31.8)
TIBC: 288 ug/dL (ref 250–450)
UIBC: 271 ug/dL

## 2024-08-05 LAB — RETICULOCYTES
Immature Retic Fract: 12.7 % (ref 2.3–15.9)
RBC.: 2.71 MIL/uL — ABNORMAL LOW (ref 3.87–5.11)
Retic Count, Absolute: 33.3 K/uL (ref 19.0–186.0)
Retic Ct Pct: 1.2 % (ref 0.4–3.1)

## 2024-08-05 LAB — PREPARE RBC (CROSSMATCH)

## 2024-08-05 LAB — CBC
HCT: 25 % — ABNORMAL LOW (ref 36.0–46.0)
Hemoglobin: 7.3 g/dL — ABNORMAL LOW (ref 12.0–15.0)
MCH: 26.4 pg (ref 26.0–34.0)
MCHC: 29.2 g/dL — ABNORMAL LOW (ref 30.0–36.0)
MCV: 90.6 fL (ref 80.0–100.0)
Platelets: 208 K/uL (ref 150–400)
RBC: 2.76 MIL/uL — ABNORMAL LOW (ref 3.87–5.11)
RDW: 17.5 % — ABNORMAL HIGH (ref 11.5–15.5)
WBC: 10.6 K/uL — ABNORMAL HIGH (ref 4.0–10.5)
nRBC: 0 % (ref 0.0–0.2)

## 2024-08-05 LAB — BASIC METABOLIC PANEL WITH GFR
Anion gap: 13 (ref 5–15)
BUN: 39 mg/dL — ABNORMAL HIGH (ref 8–23)
CO2: 16 mmol/L — ABNORMAL LOW (ref 22–32)
Calcium: 7.8 mg/dL — ABNORMAL LOW (ref 8.9–10.3)
Chloride: 109 mmol/L (ref 98–111)
Creatinine, Ser: 1.67 mg/dL — ABNORMAL HIGH (ref 0.44–1.00)
GFR, Estimated: 29 mL/min — ABNORMAL LOW (ref >60)
Glucose, Bld: 156 mg/dL — ABNORMAL HIGH (ref 70–99)
Potassium: 4.1 mmol/L (ref 3.5–5.1)
Sodium: 137 mmol/L (ref 135–145)

## 2024-08-05 LAB — FOLATE: Folate: 9 ng/mL (ref >5.9)

## 2024-08-05 LAB — HEMOGLOBIN AND HEMATOCRIT, BLOOD
HCT: 32.7 % — ABNORMAL LOW (ref 36.0–46.0)
Hemoglobin: 9.9 g/dL — ABNORMAL LOW (ref 12.0–15.0)

## 2024-08-05 LAB — VITAMIN B12: Vitamin B-12: 249 pg/mL (ref 180–914)

## 2024-08-05 LAB — FERRITIN: Ferritin: 84 ng/mL (ref 11–307)

## 2024-08-05 MED ORDER — SODIUM CHLORIDE 0.9% IV SOLUTION: Freq: Once | INTRAVENOUS | Status: AC

## 2024-08-05 MED ORDER — SODIUM CHLORIDE 0.9 % IV SOLN: INTRAVENOUS | Status: AC

## 2024-08-05 MED ORDER — SODIUM CHLORIDE 0.9 % IV BOLUS
500.0000 mL | Freq: Once | INTRAVENOUS | Status: AC
Start: 2024-08-05 — End: 2024-08-05
  Administered 2024-08-05: 500 mL via INTRAVENOUS

## 2024-08-05 MED ORDER — VITAMIN B-12 1000 MCG PO TABS
1000.0000 ug | ORAL_TABLET | Freq: Every day | ORAL | Status: DC
Start: 2024-08-05 — End: 2024-08-06
  Administered 2024-08-05 – 2024-08-06 (×2): 1000 ug via ORAL
  Filled 2024-08-05 (×2): qty 1

## 2024-08-05 NOTE — TOC Progression Note (Signed)
 Transition of Care [facility]) - Progression Note    Patient Details  Name: Rajah Lamba MRN: [id number] Date of Birth: October 17, 1932  Transition of [facility]) CM/SW Contact  NORMAN ASPEN, LCSW Phone Number: 08/05/2024, 11:34 AM  Clinical Narrative:     Have reviewed bed offers with pt/son and they have accepted bed at [facility].  Pt not yet medically cleared for dc but hopeful in 1-2 days.  Will begin insurance authorization.    Barriers to Discharge: English as a second language teacher, Continued Medical Work up, SNF Pending bed offer               Expected Discharge Plan and Services In-house Referral: Clinical Social Work   Post Acute Care Choice: Skilled Nursing Facility Living arrangements for the past 2 months: Assisted Living Facility Transport planner)                 DME Arranged: N/A DME Agency: NA                   Social Drivers of Health (SDOH) Interventions SDOH Screenings   Social Connections: Unknown (03/27/2022)   Received from [facility]  Tobacco Use: Low Risk  (08/03/2024)    Readmission Risk Interventions    08/04/2024    1:35 PM  Readmission Risk Prevention Plan  Transportation Screening Complete  PCP or Specialist Appt within 5-7 Days Complete  Home Care Screening Complete  Medication Review (RN CM) Complete

## 2024-08-05 NOTE — Plan of Care (Deleted)
   roblem: Pain Managment: Goal: General experience of comfort will improve and/or be controlled Outcome: Progressing   Problem: Elimination: Goal: Will not experience complications related to urinary retention 08/04/2024 0611 by Herschel Countryman, RN Outcome: Progressing 08/04/2024 0610 by Herschel Countryman, RN Outcome: Progressing   Problem: Coping: Goal: Level of anxiety will decrease Outcome: Progressing   Problem: Activity: Goal: Risk for activity intolerance will decrease Outcome: Progressing

## 2024-08-05 NOTE — Plan of Care (Signed)
   problem: Pain Managment: Goal: General experience of comfort will improve and/or be controlled Outcome: Progressing   Problem: Elimination: Goal: Will not experience complications related to urinary retention  Outcome: Progressing  Outcome: Progressing   Problem: Coping: Goal: Level of anxiety will decrease Outcome: Progressing   Problem: Activity: Goal: Risk for activity intolerance will decrease Outcome: Progressing

## 2024-08-05 NOTE — Progress Notes (Signed)
 [facility] PROGRESS NOTE   Mandy Adams FMW:[id number] DOB: 02/23/1932 DOA: 08/03/2024  PCP: Okey Carlin Redbird, MD  Brief History: 89 y.o. female with medical history significant of AKI, CAP, dementia, hyperlipidemia, hypertension, leukocytosis, GERD, eczema, aortic atherosclerosis, stage IIIa CKD who was brought to the emergency department due to having an unwitnessed fall at her facility injuring her left hip area with inability to bear weight on her LLE.  Thought to be a mechanical fall.  She was found to have left hip fracture.  She was hospitalized for further management  Consultants: Orthopedics  Procedures: Cemented hemiarthroplasty left femoral neck fracture on 9/22    Subjective/Interval History: Patient remains pleasantly confused.  Does not appear to be in any discomfort or distress.  Still feeling weak and fatigued with occasional dizziness.     Assessment/Plan:  Left hip fracture Status post surgery on 9/22.  Orthopedics following.  Pain appears to be reasonably well-controlled. Noted to be on aspirin  twice a day for DVT prophylaxis. Seen by physical therapy.  SNF or rehab is recommended.  Acute kidney injury/chronic kidney disease stage IIIa/metabolic acidosis Creatinine was 1.24 in February.  Came in with a creatinine of 1.23.  Creatinine worsened to 1.63 yesterday and noted to be 1.67 today.  Degree of metabolic acidosis is also noted. All of this is likely due to hypovolemia and blood loss.  She was not on any diuretics. She was given IV fluids.  Hopefully renal function is plateauing.  Monitor urine output.  UA was unremarkable. Avoid nephrotoxic agents.    Normocytic anemia Baseline hemoglobin seems to be around 11.  Drop in hemoglobin most likely due to fracture and surgery.  Hemoglobin 7.3 this morning.  She is symptomatic so we will go ahead and transfuse 1 unit of PRBC. Anemia panel was done.  Ferritin is pending.  Iron is 17, TIBC 288, percent  saturation 6.  Folic acid 9.0.  Vitamin B12 is low normal at 249.  Will initiate B12 supplementation.    Hypotension  She is not on any blood pressure lowering agents prior to admission.  Looks like her metoprolol was recently discontinued by outpatient providers. Low blood pressures noted postoperatively.  Given multiple fluid boluses yesterday with improvement in blood pressure.  Will continue with maintenance fluids.  Continue to monitor closely.    Lactic acidosis This was minimally elevated.  Improvement noted.  No further workup anticipated at this time.  Leukocytosis Most likely reactive.  Improvement noted.  Diaphragmatic hernia without obstruction and without gangrene Seems to be asymptomatic.  Probably a chronic issue.  Continue PPI. History of dementia Continue memantine   History of adjustment disorder Continue her psychotropic agents.  L1 vertebral body compression fracture Age indeterminate L1 vertebral body compression fracture noted incidentally on CT scan.  Noted to be asymptomatic.   DVT Prophylaxis: Aspirin  twice a day Code Status: DNR Family Communication: No family at bedside.  Discussed with patient's daughter who gave consent for blood transfusion. Disposition Plan: SNF when medically stable    Medications: Scheduled:  aspirin  EC  325 mg Oral BID   docusate sodium   100 mg Oral BID   feeding supplement  237 mL Oral BID BM   memantine   5 mg Oral QHS   mirtazapine   15 mg Oral QHS   pantoprazole   20 mg Oral Daily   sertraline   50 mg Oral Daily   Continuous:  sodium chloride      PRN:acetaminophen , alum & mag hydroxide-simeth, HYDROcodone -acetaminophen , HYDROcodone -acetaminophen , magnesium  citrate,  meclizine , menthol  **OR** phenol, methocarbamol  **OR** methocarbamol  (ROBAXIN ) injection, morphine  injection, ondansetron  **OR** ondansetron  (ZOFRAN ) IV, mouth rinse, polyethylene glycol, sorbitol   Antibiotics: Anti-infectives (From admission, onward)     Start     Dose/Rate Route Frequency Ordered Stop   08/03/24 2000  ceFAZolin  (ANCEF ) IVPB 1 g/50 mL premix  Status:  Discontinued        1 g 100 mL/hr over 30 Minutes Intravenous Every 8 hours 08/03/24 1754 08/04/24 0453   08/03/24 1557  vancomycin  (VANCOCIN ) powder  Status:  Discontinued          As needed 08/03/24 1557 08/03/24 1619   08/03/24 1245  ceFAZolin  (ANCEF ) IVPB 2g/100 mL premix        2 g 200 mL/hr over 30 Minutes Intravenous On call to O.R. 08/03/24 1241 08/03/24 1434       Objective:  Vital Signs  Vitals:   08/04/24 2340 08/05/24 0328 08/05/24 0500 08/05/24 0858  BP: (!) 74/56 109/60  105/60  Pulse: 95 90  84  Resp: 14 14  16   Temp: (!) 97.3 F (36.3 C) 98.4 F (36.9 C)  98.3 F (36.8 C)  TempSrc: Oral Oral    SpO2: 94% 99%  99%  Weight:   51.8 kg   Height:        Intake/Output Summary (Last 24 hours) at 08/05/2024 0915 Last data filed at 08/05/2024 0610 Gross per 24 hour  Intake 1981.67 ml  Output 1000 ml  Net 981.67 ml   Filed Weights   08/03/24 0858 08/03/24 1254 08/05/24 0500  Weight: 54 kg 54 kg 51.8 kg    General appearance: Awake alert.  In no distress.  Distracted Resp: Clear to auscultation bilaterally.  Normal effort Cardio: S1-S2 is normal regular.  No S3-S4.  No rubs murmurs or bruit GI: Abdomen is soft.  Nontender nondistended.  Bowel sounds are present normal.  No masses organomegaly Extremities: Restricted range of motion of lower extremities. No obvious focal neurological deficits  Lab Results:  Data Reviewed: I have personally reviewed following labs and reports of the imaging studies  CBC: Recent Labs  Lab 08/03/24 0910 08/03/24 1848 08/04/24 0341 08/04/24 1857 08/05/24 0343  WBC 19.1* 15.5* 12.1*  --  10.6*  HGB 10.8* 8.9* 8.2* 7.3* 7.3*  HCT 35.3* 30.0* 27.9* 23.6* 25.0*  MCV 86.5 88.5 90.0  --  90.6  PLT 327 197 166  --  208    Basic Metabolic Panel: Recent Labs  Lab 08/03/24 0937 08/03/24 1848 08/04/24 0341  08/05/24 0343  NA 141 137 137 137  K 4.1 4.8 4.2 4.1  CL 108 104 106 109  CO2 18* 20* 19* 16*  GLUCOSE 102* 138* 116* 156*  BUN 30* 31* 34* 39*  CREATININE 1.23* 1.50* 1.63* 1.67*  CALCIUM  8.3* 8.4* 8.1* 7.8*  MG 2.1  --   --   --     GFR: Estimated Creatinine Clearance: 16.1 mL/min (A) (by C-G formula based on SCr of 1.67 mg/dL (H)).  Liver Function Tests: Recent Labs  Lab 08/03/24 0937  AST 27  ALT 11  ALKPHOS 75  BILITOT 0.5  PROT 6.0*  ALBUMIN  3.4*    Recent Labs  Lab 08/03/24 0937  LIPASE 14   Cardiac Enzymes: Recent Labs  Lab 08/03/24 0937  CKTOTAL 63    CBG: Recent Labs  Lab 08/03/24 0919  GLUCAP 105*    Radiology Studies: DG Pelvis Portable Result Date: 08/03/2024 EXAM: 1 or 2 VIEW(S) XRAY OF THE PELVIS  08/03/2024 04:54:00 PM COMPARISON: Earlier today. CLINICAL HISTORY: Deficient knowledge of open reduction and internal (ORIF) fixation of hip [id number]. Status post left arthoplasty. FINDINGS: BONES AND JOINTS: Left hip arthroplasty in place. No periprosthetic lucency or fracture. SOFT TISSUES: Subcutaneous soft tissue edema and emphysema consistent with postsurgical changes. IMPRESSION: 1. Left hip arthroplasty without immediate post-operative complication. Electronically signed by: Andrea Gasman MD 08/03/2024 06:08 PM EDT RP Workstation: [id number]   DG HIP UNILAT WITH PELVIS 1V LEFT Result Date: 08/03/2024 EXAM: 1 VIEW XRAY OF THE LEFT HIP 08/03/2024 03:50:00 PM COMPARISON: 08/03/24 CLINICAL HISTORY: [id number] Surgery, elective [id number]. FINDINGS: BONES AND JOINTS: Seven fluoroscopic spot views of the pelvis and left hip submitted from the operating room. Sequential images during left hip arthroplasty. Fluoro time: 3 sec, Fluoro dose: .2068 mGy. IMPRESSION: 1. Procedural fluoroscopy during left hip arthroplasty . Electronically signed by: Andrea Gasman MD 08/03/2024 06:06 PM EDT RP Workstation: [id number]   DG C-Arm 1-60 Min-No Report Result Date:  08/03/2024 Fluoroscopy was utilized by the requesting physician.  No radiographic interpretation.   DG C-Arm 1-60 Min-No Report Result Date: 08/03/2024 Fluoroscopy was utilized by the requesting physician.  No radiographic interpretation.   CT HEAD WO CONTRAST Result Date: 08/03/2024 CLINICAL DATA:  Head trauma, fall, injury EXAM: CT HEAD WITHOUT CONTRAST TECHNIQUE: Contiguous axial images were obtained from the base of the skull through the vertex without intravenous contrast. RADIATION DOSE REDUCTION: This exam was performed according to the departmental dose-optimization program which includes automated exposure control, adjustment of the mA and/or kV according to patient size and/or use of iterative reconstruction technique. COMPARISON:  August 02, 2020 MRI FINDINGS: Brain: Nonspecific periventricular and subcortical white matter hypodensities, likely secondary to chronic microvascular disease. No acute intracranial hemorrhage. Diffuse cerebral atrophy. No midline shift. Basal cisterns are patent. Vascular: Unremarkable. Skull: No acute findings. Sinuses/Orbits: Opacification of the right sphenoid sinus. Other: None. IMPRESSION: No acute intracranial pathology. Chronic white matter changes and diffuse cerebral atrophy. Opacification of the right sphenoid sinus. Electronically Signed   By: Michaeline Blanch M.D.   On: 08/03/2024 12:07   CT CHEST ABDOMEN PELVIS WO CONTRAST Result Date: 08/03/2024 CLINICAL DATA:  Blunt poly trauma, fall, injury EXAM: CT CHEST, ABDOMEN AND PELVIS WITHOUT CONTRAST TECHNIQUE: Multidetector CT imaging of the chest, abdomen and pelvis was performed following the standard protocol without IV contrast. RADIATION DOSE REDUCTION: This exam was performed according to the departmental dose-optimization program which includes automated exposure control, adjustment of the mA and/or kV according to patient size and/or use of iterative reconstruction technique. COMPARISON:  Same day  additional trauma studies and priors FINDINGS: Limited evaluation of the solid organs and vasculature due to lack of IV contrast. CT CHEST FINDINGS Cardiovascular: Normal caliber aorta. Scattered aortic calcifications. No pericardial effusion. Mediastinum/Nodes: Large left-sided diaphragmatic hernia containing a majority of the stomach and multiple loops of large bowel with chronic organo-axial rotation of the stomach and mass effect on the adjacent heart and lungs. No lymphadenopathy. Lungs/Pleura: Similar appearance of biapical pleuroparenchymal scarring and calcified sub pleural plaques. Chronic left lower lobe atelectasis/consolidation related to large left diaphragmatic hernia. Right basilar atelectasis. No pleural effusions or pneumothorax. Musculoskeletal: Age-indeterminate L1 vertebral body compression fracture. CT ABDOMEN PELVIS FINDINGS Hepatobiliary: Unremarkable. Pancreas: Unremarkable. Spleen: Unremarkable. Adrenals/Urinary Tract: Adrenal glands are unremarkable. No hydronephrosis or nephrolithiasis. Stomach/Bowel: No evidence of bowel obstruction or inflammation. Colonic diverticulosis. Large left-sided diaphragmatic hernia as described above. Vascular/Lymphatic: Normal caliber abdominal aorta. Reproductive: Status post hysterectomy. No adnexal masses. Other: No free  air or free fluid. Musculoskeletal: Age-indeterminate L1 vertebral body compression fracture. Mildly displaced and angulated left femoral neck fracture. Multilevel degenerative vertebral changes. IMPRESSION: Limited evaluation of the solid organs and vasculature without IV contrast. 1.  Redemonstration of left femoral neck fracture. 2.  Age indeterminate L1 vertebral body compression fracture. 3. Unchanged large, chronic left-sided diaphragmatic hernia with organoaxial rotation of the stomach and mass effect on the adjacent lung and mediastinum. No evidence of bowel obstruction. Electronically Signed   By: Michaeline Blanch M.D.   On:  08/03/2024 12:03   CT CERVICAL SPINE WO CONTRAST Result Date: 08/03/2024 CLINICAL DATA:  Poly trauma, blood. Unwitnessed fall. History of hypertension and dementia. EXAM: CT CERVICAL SPINE WITHOUT CONTRAST TECHNIQUE: Multidetector CT imaging of the cervical spine was performed without intravenous contrast. Multiplanar CT image reconstructions were also generated. RADIATION DOSE REDUCTION: This exam was performed according to the departmental dose-optimization program which includes automated exposure control, adjustment of the mA and/or kV according to patient size and/or use of iterative reconstruction technique. COMPARISON:  Chest CT 04/30/2018 FINDINGS: Alignment: 3 mm of anterolisthesis at C3-4 appears degenerative. Skull base and vertebrae: No evidence of acute fracture or traumatic subluxation. There is evidence for interbody and at least partial interfacetal ankylosis at the C2-3 and C4-5 levels. Mild asymmetric widening of the left C3-4 facet joint. Soft tissues and spinal canal: No prevertebral fluid or swelling. No visible canal hematoma. Disc levels: Multilevel spondylosis with disc space narrowing, uncinate spurring and facet hypertrophy. As above, interbody and at least partial interfacetal ankylosis at the C2-3 and C4-5 levels. Degenerative appearing anterolisthesis at C3-4 with resulting mild spinal stenosis and mild to moderate foraminal narrowing bilaterally. Upper chest: Chest CT findings dictated separately. Other: Bilateral carotid atherosclerosis. IMPRESSION: 1. No evidence of acute cervical spine fracture, traumatic subluxation or static signs of instability. 2. Multilevel cervical spondylosis as described with degenerative anterolisthesis at C3-4. 3. 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 Electronically Signed   By: Elsie Perone M.D.   On: 08/03/2024 11:43   DG Hip Unilat W or Wo Pelvis 2-3 Views Left Result Date: 08/03/2024 CLINICAL DATA:  Fall, left  leg pain. EXAM: DG HIP (WITH OR WITHOUT PELVIS) 2-3V LEFT COMPARISON:  None Available. FINDINGS: Displaced left femoral neck fracture with superior displacement of the distal fracture fragment and varus angulation. No additional evidence of an acute fracture. Degenerative changes in the lower lumbosacral spine are incidentally imaged. IMPRESSION: Left femoral neck fracture. Electronically Signed   By: Newell Eke M.D.   On: 08/03/2024 10:24   DG Chest Port 1 View Result Date: 08/03/2024 CLINICAL DATA:  Fall, left leg pain. EXAM: PORTABLE CHEST 1 VIEW COMPARISON:  01/02/2024 and CT chest 04/30/2018. FINDINGS: Trachea is midline. Heart is at the upper limits of normal in size to mildly enlarged, stable. Thoracic aorta is calcified. Very large hiatal hernia in the lower left hemithorax. Lungs are clear. No pleural fluid. IMPRESSION: 1. No acute findings. 2. Large hiatal hernia. Electronically Signed   By: Newell Eke M.D.   On: 08/03/2024 10:24       LOS: 2 days   Joette Pebbles  [facility] Pager on [email or web address]  08/05/2024, 9:15 AM

## 2024-08-05 NOTE — Progress Notes (Signed)
Physical Therapy Treatment Patient Details Name: Mandy Adams MRN: [id number] DOB: 1932/01/03 Today's Date: 08/05/2024   History of Present Illness Pt is a 89 y/o female presenting after an unwitnessed fall at ALF. Imaging showed L femoral neck fx. Pt underwent L THA (anterior approach) on 9/22. L1 vertebral body compression fracture  Age indeterminate L1 vertebral body compression fracture noted incidentally on CT scan. PMH: AKI, CAP, dementia, HLD, HTN, leukocytosis, GERD, eczema, aortic atherosclerosis, CKD3    PT Comments  Pt is pleasant, cooperative; c/o dizziness in sitting with BP 111/72, dizziness improved with incr time sitting EOB. Pt with post op ABLA and began unit of blood >1hr prior to PT session, has had multiple fluid boluses since yesterday's PT session. Pt reports that she has dizziness at baseline.  Improved functional mobility this session, incr pt effort overall with pt able to transfer bed to chair with min assist of 2, pain controlled with WBing. D/c plan remains appropriate, continue PT POC   If plan is discharge home, recommend the following: A little help with walking and/or transfers;A little help with bathing/dressing/bathroom;Help with stairs or ramp for entrance;Assist for transportation;Supervision due to cognitive status   Can travel by private vehicle     No  Equipment Recommendations  None recommended by PT    Recommendations for Other Services       Precautions / Restrictions Precautions Precautions: Fall Restrictions Weight Bearing Restrictions Per Provider Order: No LLE Weight Bearing Per Provider Order: Weight bearing as tolerated     Mobility  Bed Mobility Overal bed mobility: Needs Assistance Bed Mobility: Supine to Sit     Supine to sit: Mod assist, +2 for safety/equipment     General bed mobility comments: good initiation of LEs to EOB, assist to lift trunk; with incr time pt able to scoot to EOB    Transfers Overall transfer  level: Needs assistance Equipment used: Rolling walker (2 wheels) Transfers: Sit to/from Stand, Bed to chair/wheelchair/BSC Sit to Stand: Mod assist, Min assist, +2 safety/equipment   Step pivot transfers: Min assist, +2 physical assistance, +2 safety/equipment       General transfer comment: cues for hand placement, sequencing and safety.incr time needed; assist to rise, steady for STS, assist to managem RW and steady for SPT    Ambulation/Gait                   Stairs             Wheelchair Mobility     Tilt Bed    Modified Rankin (Stroke Patients Only)       Balance Overall balance assessment: Needs assistance, History of Falls Sitting-balance support: No upper extremity supported, Feet supported Sitting balance-Leahy Scale: Fair     Standing balance support: Bilateral upper extremity supported, During functional activity, Reliant on assistive device for balance Standing balance-Leahy Scale: Poor Standing balance comment: reliant on device and external assist                            Communication Communication Communication: No apparent difficulties  Cognition Arousal: Alert Behavior During Therapy: WFL for tasks assessed/performed   PT - Cognitive impairments: History of cognitive impairments                       PT - Cognition Comments: decr STM, pleasant and follows one step commands Following commands: Impaired Following commands impaired: Only follows one step  commands consistently    Cueing Cueing Techniques: Verbal cues, Gestural cues, Tactile cues  Exercises      General Comments        Pertinent Vitals/Pain Pain Assessment Pain Assessment: Faces Faces Pain Scale: Hurts a little bit Pain Location: L hip Pain Descriptors / Indicators: Grimacing, Sore Pain Intervention(s): Limited activity within patient's tolerance, Monitored during session, Repositioned    Home Living                           Prior Function            PT Goals (current goals can now be found in the care plan section) Acute Rehab PT Goals PT Goal Formulation: With patient Time For Goal Achievement: 08/23/2024 Potential to Achieve Goals: Good Progress towards PT goals: Progressing toward goals    Frequency    Min 3X/week      PT Plan      Co-evaluation              AM-PAC PT 6 Clicks Mobility   Outcome Measure  Help needed turning from your back to your side while in a flat bed without using bedrails?: A Little Help needed moving from lying on your back to sitting on the side of a flat bed without using bedrails?: A Little Help needed moving to and from a bed to a chair (including a wheelchair)?: A Little Help needed standing up from a chair using your arms (e.g., wheelchair or bedside chair)?: A Little Help needed to walk in hospital room?: A Little Help needed climbing 3-5 steps with a railing? : A Little 6 Click Score: 18    End of Session Equipment Utilized During Treatment: Gait belt Activity Tolerance: Patient tolerated treatment well;Patient limited by fatigue Patient left: in chair;with call bell/phone within reach;with chair alarm set Nurse Communication: Mobility status PT Visit Diagnosis: Other abnormalities of gait and mobility (R26.89);Muscle weakness (generalized) (M62.81)     Time: 8865-8840 PT Time Calculation (min) (ACUTE ONLY): 25 min  Charges:    $Gait Training: 8-22 mins $Therapeutic Activity: 8-22 mins PT General Charges $$ ACUTE PT VISIT: 1 Visit                     Kamira Mellette, PT  Acute Rehab Dept [facility]) [phone]  08/05/2024    [facility] 08/05/2024, 12:03 PM

## 2024-08-05 NOTE — Anesthesia Postprocedure Evaluation (Signed)
 Anesthesia Post Note  Patient: Mandy Adams  Procedure(s) Performed: ARTHROPLASTY, HIP, TOTAL, ANTERIOR APPROACH (Left: Hip)     Patient location during evaluation: PACU Anesthesia Type: General Level of consciousness: awake and alert Pain management: pain level controlled Vital Signs Assessment: post-procedure vital signs reviewed and stable Respiratory status: spontaneous breathing, nonlabored ventilation, respiratory function stable and patient connected to nasal cannula oxygen Cardiovascular status: blood pressure returned to baseline and stable Postop Assessment: no apparent nausea or vomiting Anesthetic complications: no   No notable events documented.              Lynwood MARLA Cornea

## 2024-08-05 NOTE — Progress Notes (Signed)
 Subjective: 2 Days Post-Op Procedure(s) (LRB): ARTHROPLASTY, HIP, TOTAL, ANTERIOR APPROACH (Left) Patient reports pain as mild.  Resting comfortably  Objective: Vital signs in last 24 hours: Temp:  [97.3 F (36.3 C)-98.4 F (36.9 C)] 98.4 F (36.9 C) (09/24 0328) Pulse Rate:  [90-104] 90 (09/24 0328) Resp:  [14-20] 14 (09/24 0328) BP: (74-109)/(48-62) 109/60 (09/24 0328) SpO2:  [88 %-100 %] 99 % (09/24 0328) Weight:  [51.8 kg] 51.8 kg (09/24 0500)  Intake/Output from previous day: 09/23 0701 - 09/24 0700 In: 2218.7 [P.O.:717; I.V.:1001.7; IV Piggyback:500] Out: 1000 [Urine:1000] Intake/Output this shift: No intake/output data recorded.  Recent Labs    08/03/24 0910 08/03/24 1848 08/04/24 0341 08/04/24 1857 08/05/24 0343  HGB 10.8* 8.9* 8.2* 7.3* 7.3*   Recent Labs    08/04/24 0341 08/04/24 1857 08/05/24 0343  WBC 12.1*  --  10.6*  RBC 3.10*  --  2.76*  2.71*  HCT 27.9* 23.6* 25.0*  PLT 166  --  208   Recent Labs    08/04/24 0341 08/05/24 0343  NA 137 137  K 4.2 4.1  CL 106 109  CO2 19* 16*  BUN 34* 39*  CREATININE 1.63* 1.67*  GLUCOSE 116* 156*  CALCIUM  8.1* 7.8*   No results for input(s): LABPT, INR in the last 72 hours.  Neurovascular intact Sensation intact distally Intact pulses distally Dorsiflexion/Plantar flexion intact Incision: dressing C/D/I No cellulitis present Compartment soft   Assessment/Plan: 2 Days Post-Op Procedure(s) (LRB): ARTHROPLASTY, HIP, TOTAL, ANTERIOR APPROACH (Left) Up with therapy WBAT LLE ABLA- 7.3 this am.  Per hospitalist note, will transfuse if less than 7.  Will defer ? Transfusion to primary team Norco for pain control- rx on chart ASA-SCDs for dvt ppx- rx on chart F/u with ortho two weeks post-op      Ronal LITTIE Grave 08/05/2024, 7:01 AM

## 2024-08-05 NOTE — Plan of Care (Signed)
  Problem: Activity: Goal: Risk for activity intolerance will decrease Outcome: Progressing   Problem: Nutrition: Goal: Adequate nutrition will be maintained Outcome: Progressing   Problem: Coping: Goal: Level of anxiety will decrease Outcome: Progressing   Problem: Pain Managment: Goal: General experience of comfort will improve and/or be controlled Outcome: Progressing   Problem: Skin Integrity: Goal: Risk for impaired skin integrity will decrease Outcome: Progressing

## 2024-08-06 ENCOUNTER — Emergency Department (HOSPITAL_COMMUNITY)

## 2024-08-06 ENCOUNTER — Inpatient Hospital Stay (HOSPITAL_COMMUNITY)
Admission: EM | Admit: 2024-08-06 | Discharge: 2024-08-10 | DRG: 299 | Disposition: A | Discharge status: Skilled Nursing Facility | Source: Skilled Nursing Facility | Attending: Internal Medicine | Admitting: Internal Medicine

## 2024-08-06 DIAGNOSIS — J9601 Acute respiratory failure with hypoxia: Secondary | ICD-10-CM | POA: Diagnosis not present

## 2024-08-06 DIAGNOSIS — R52 Pain, unspecified: Secondary | ICD-10-CM

## 2024-08-06 DIAGNOSIS — E860 Dehydration: Secondary | ICD-10-CM | POA: Diagnosis present

## 2024-08-06 DIAGNOSIS — I2699 Other pulmonary embolism without acute cor pulmonale: Secondary | ICD-10-CM | POA: Diagnosis present

## 2024-08-06 DIAGNOSIS — G9340 Encephalopathy, unspecified: Secondary | ICD-10-CM | POA: Diagnosis present

## 2024-08-06 DIAGNOSIS — R230 Cyanosis: Secondary | ICD-10-CM | POA: Diagnosis present

## 2024-08-06 DIAGNOSIS — R638 Other symptoms and signs concerning food and fluid intake: Secondary | ICD-10-CM

## 2024-08-06 DIAGNOSIS — L89312 Pressure ulcer of right buttock, stage 2: Secondary | ICD-10-CM | POA: Diagnosis present

## 2024-08-06 DIAGNOSIS — F05 Delirium due to known physiological condition: Secondary | ICD-10-CM | POA: Diagnosis present

## 2024-08-06 DIAGNOSIS — F03B4 Unspecified dementia, moderate, with anxiety: Secondary | ICD-10-CM | POA: Diagnosis present

## 2024-08-06 DIAGNOSIS — Z1152 Encounter for screening for COVID-19: Secondary | ICD-10-CM

## 2024-08-06 DIAGNOSIS — K449 Diaphragmatic hernia without obstruction or gangrene: Secondary | ICD-10-CM | POA: Diagnosis present

## 2024-08-06 DIAGNOSIS — E785 Hyperlipidemia, unspecified: Secondary | ICD-10-CM | POA: Diagnosis present

## 2024-08-06 DIAGNOSIS — D649 Anemia, unspecified: Secondary | ICD-10-CM | POA: Diagnosis present

## 2024-08-06 DIAGNOSIS — Z6823 Body mass index (BMI) 23.0-23.9, adult: Secondary | ICD-10-CM

## 2024-08-06 DIAGNOSIS — D631 Anemia in chronic kidney disease: Secondary | ICD-10-CM | POA: Diagnosis present

## 2024-08-06 DIAGNOSIS — T81718A Complication of other artery following a procedure, not elsewhere classified, initial encounter: Principal | ICD-10-CM | POA: Diagnosis present

## 2024-08-06 DIAGNOSIS — F0390 Unspecified dementia without behavioral disturbance: Secondary | ICD-10-CM

## 2024-08-06 DIAGNOSIS — J90 Pleural effusion, not elsewhere classified: Secondary | ICD-10-CM | POA: Diagnosis present

## 2024-08-06 DIAGNOSIS — L309 Dermatitis, unspecified: Secondary | ICD-10-CM | POA: Diagnosis present

## 2024-08-06 DIAGNOSIS — I10 Essential (primary) hypertension: Secondary | ICD-10-CM | POA: Diagnosis not present

## 2024-08-06 DIAGNOSIS — I700 Atherosclerosis of aorta: Secondary | ICD-10-CM | POA: Diagnosis present

## 2024-08-06 DIAGNOSIS — E872 Acidosis, unspecified: Secondary | ICD-10-CM | POA: Diagnosis present

## 2024-08-06 DIAGNOSIS — J9811 Atelectasis: Secondary | ICD-10-CM | POA: Diagnosis present

## 2024-08-06 DIAGNOSIS — Z96642 Presence of left artificial hip joint: Secondary | ICD-10-CM | POA: Diagnosis present

## 2024-08-06 DIAGNOSIS — N183 Chronic kidney disease, stage 3 unspecified: Secondary | ICD-10-CM | POA: Diagnosis present

## 2024-08-06 DIAGNOSIS — Z515 Encounter for palliative care: Secondary | ICD-10-CM

## 2024-08-06 DIAGNOSIS — M4856XA Collapsed vertebra, not elsewhere classified, lumbar region, initial encounter for fracture: Secondary | ICD-10-CM | POA: Diagnosis present

## 2024-08-06 DIAGNOSIS — M79605 Pain in left leg: Secondary | ICD-10-CM | POA: Diagnosis present

## 2024-08-06 DIAGNOSIS — N1831 Chronic kidney disease, stage 3a: Secondary | ICD-10-CM | POA: Diagnosis present

## 2024-08-06 DIAGNOSIS — K219 Gastro-esophageal reflux disease without esophagitis: Secondary | ICD-10-CM | POA: Diagnosis present

## 2024-08-06 DIAGNOSIS — Z79899 Other long term (current) drug therapy: Secondary | ICD-10-CM

## 2024-08-06 DIAGNOSIS — I2609 Other pulmonary embolism with acute cor pulmonale: Principal | ICD-10-CM | POA: Diagnosis present

## 2024-08-06 DIAGNOSIS — D72829 Elevated white blood cell count, unspecified: Secondary | ICD-10-CM | POA: Diagnosis present

## 2024-08-06 DIAGNOSIS — R5381 Other malaise: Secondary | ICD-10-CM | POA: Diagnosis present

## 2024-08-06 DIAGNOSIS — R64 Cachexia: Secondary | ICD-10-CM | POA: Diagnosis present

## 2024-08-06 DIAGNOSIS — I1310 Hypertensive heart and chronic kidney disease without heart failure, with stage 1 through stage 4 chronic kidney disease, or unspecified chronic kidney disease: Secondary | ICD-10-CM | POA: Diagnosis present

## 2024-08-06 DIAGNOSIS — F03B0 Unspecified dementia, moderate, without behavioral disturbance, psychotic disturbance, mood disturbance, and anxiety: Secondary | ICD-10-CM | POA: Diagnosis present

## 2024-08-06 DIAGNOSIS — R000 Tachycardia, unspecified: Secondary | ICD-10-CM | POA: Diagnosis present

## 2024-08-06 DIAGNOSIS — Z7901 Long term (current) use of anticoagulants: Secondary | ICD-10-CM

## 2024-08-06 DIAGNOSIS — Z90710 Acquired absence of both cervix and uterus: Secondary | ICD-10-CM

## 2024-08-06 DIAGNOSIS — Z8249 Family history of ischemic heart disease and other diseases of the circulatory system: Secondary | ICD-10-CM

## 2024-08-06 DIAGNOSIS — R7989 Other specified abnormal findings of blood chemistry: Secondary | ICD-10-CM | POA: Diagnosis present

## 2024-08-06 DIAGNOSIS — Z66 Do not resuscitate: Secondary | ICD-10-CM | POA: Diagnosis present

## 2024-08-06 DIAGNOSIS — Z7189 Other specified counseling: Secondary | ICD-10-CM

## 2024-08-06 LAB — BLOOD GAS, VENOUS
Acid-base deficit: 7.4 mmol/L — ABNORMAL HIGH (ref 0.0–2.0)
Bicarbonate: 19.7 mmol/L — ABNORMAL LOW (ref 20.0–28.0)
O2 Saturation: 37.3 %
Patient temperature: 37
pCO2, Ven: 45 mmHg (ref 44–60)
pH, Ven: 7.25 (ref 7.25–7.43)
pO2, Ven: <31 mmHg — CL (ref 32–45)

## 2024-08-06 LAB — TYPE AND SCREEN
ABO/RH(D): O POS
Antibody Screen: NEGATIVE
Unit division: 0

## 2024-08-06 LAB — I-STAT CHEM 8, ED
BUN: 32 mg/dL — ABNORMAL HIGH (ref 8–23)
Calcium, Ion: 1.2 mmol/L (ref 1.15–1.40)
Chloride: 113 mmol/L — ABNORMAL HIGH (ref 98–111)
Creatinine, Ser: 1.3 mg/dL — ABNORMAL HIGH (ref 0.44–1.00)
Glucose, Bld: 121 mg/dL — ABNORMAL HIGH (ref 70–99)
HCT: 29 % — ABNORMAL LOW (ref 36.0–46.0)
Hemoglobin: 9.9 g/dL — ABNORMAL LOW (ref 12.0–15.0)
Potassium: 3.9 mmol/L (ref 3.5–5.1)
Sodium: 142 mmol/L (ref 135–145)
TCO2: 18 mmol/L — ABNORMAL LOW (ref 22–32)

## 2024-08-06 LAB — CBC
HCT: 30.3 % — ABNORMAL LOW (ref 36.0–46.0)
Hemoglobin: 9.1 g/dL — ABNORMAL LOW (ref 12.0–15.0)
MCH: 26.9 pg (ref 26.0–34.0)
MCHC: 30 g/dL (ref 30.0–36.0)
MCV: 89.6 fL (ref 80.0–100.0)
Platelets: 190 K/uL (ref 150–400)
RBC: 3.38 MIL/uL — ABNORMAL LOW (ref 3.87–5.11)
RDW: 16.9 % — ABNORMAL HIGH (ref 11.5–15.5)
WBC: 12.4 K/uL — ABNORMAL HIGH (ref 4.0–10.5)
nRBC: 0 % (ref 0.0–0.2)

## 2024-08-06 LAB — CBC WITH DIFFERENTIAL/PLATELET
Abs Immature Granulocytes: 0.21 K/uL — ABNORMAL HIGH (ref 0.00–0.07)
Basophils Absolute: 0.1 K/uL (ref 0.0–0.1)
Basophils Relative: 1 %
Eosinophils Absolute: 0.3 K/uL (ref 0.0–0.5)
Eosinophils Relative: 3 %
HCT: 31.5 % — ABNORMAL LOW (ref 36.0–46.0)
Hemoglobin: 9.7 g/dL — ABNORMAL LOW (ref 12.0–15.0)
Immature Granulocytes: 2 %
Lymphocytes Relative: 10 %
Lymphs Abs: 1.2 K/uL (ref 0.7–4.0)
MCH: 27.5 pg (ref 26.0–34.0)
MCHC: 30.8 g/dL (ref 30.0–36.0)
MCV: 89.2 fL (ref 80.0–100.0)
Monocytes Absolute: 0.9 K/uL (ref 0.1–1.0)
Monocytes Relative: 8 %
Neutro Abs: 8.9 K/uL — ABNORMAL HIGH (ref 1.7–7.7)
Neutrophils Relative %: 76 %
Platelets: UNDETERMINED K/uL (ref 150–400)
RBC: 3.53 MIL/uL — ABNORMAL LOW (ref 3.87–5.11)
RDW: 17.2 % — ABNORMAL HIGH (ref 11.5–15.5)
WBC: 11.6 K/uL — ABNORMAL HIGH (ref 4.0–10.5)
nRBC: 0.2 % (ref 0.0–0.2)

## 2024-08-06 LAB — RESP PANEL BY RT-PCR (RSV, FLU A&B, COVID)  RVPGX2
Influenza A by PCR: NEGATIVE
Influenza B by PCR: NEGATIVE
Resp Syncytial Virus by PCR: NEGATIVE
SARS Coronavirus 2 by RT PCR: NEGATIVE

## 2024-08-06 LAB — BASIC METABOLIC PANEL WITH GFR
Anion gap: 10 (ref 5–15)
Anion gap: 9 (ref 5–15)
BUN: 39 mg/dL — ABNORMAL HIGH (ref 8–23)
BUN: 34 mg/dL — ABNORMAL HIGH (ref 8–23)
CO2: 18 mmol/L — ABNORMAL LOW (ref 22–32)
CO2: 18 mmol/L — ABNORMAL LOW (ref 22–32)
Calcium: 8.3 mg/dL — ABNORMAL LOW (ref 8.9–10.3)
Calcium: 8.3 mg/dL — ABNORMAL LOW (ref 8.9–10.3)
Chloride: 113 mmol/L — ABNORMAL HIGH (ref 98–111)
Chloride: 113 mmol/L — ABNORMAL HIGH (ref 98–111)
Creatinine, Ser: 1.41 mg/dL — ABNORMAL HIGH (ref 0.44–1.00)
Creatinine, Ser: 1.24 mg/dL — ABNORMAL HIGH (ref 0.44–1.00)
GFR, Estimated: 35 mL/min — ABNORMAL LOW (ref >60)
GFR, Estimated: 41 mL/min — ABNORMAL LOW (ref >60)
Glucose, Bld: 117 mg/dL — ABNORMAL HIGH (ref 70–99)
Glucose, Bld: 124 mg/dL — ABNORMAL HIGH (ref 70–99)
Potassium: 4.5 mmol/L (ref 3.5–5.1)
Potassium: 4 mmol/L (ref 3.5–5.1)
Sodium: 141 mmol/L (ref 135–145)
Sodium: 140 mmol/L (ref 135–145)

## 2024-08-06 LAB — BPAM RBC
Blood Product Expiration Date: 202510252359
ISSUE DATE / TIME: 202509241036
Unit Type and Rh: 5100

## 2024-08-06 LAB — CREATININE, URINE, RANDOM: Creatinine, Urine: 14 mg/dL

## 2024-08-06 LAB — TROPONIN T, HIGH SENSITIVITY: Troponin T High Sensitivity: 69 ng/L — ABNORMAL HIGH (ref 0–19)

## 2024-08-06 LAB — I-STAT CG4 LACTIC ACID, ED: Lactic Acid, Venous: 1.8 mmol/L (ref 0.5–1.9)

## 2024-08-06 LAB — PRO BRAIN NATRIURETIC PEPTIDE: Pro Brain Natriuretic Peptide: 22337 pg/mL — ABNORMAL HIGH (ref <300.0)

## 2024-08-06 LAB — SODIUM, URINE, RANDOM: Sodium, Ur: 112 mmol/L

## 2024-08-06 MED ORDER — METHOCARBAMOL 500 MG PO TABS: 500.0000 mg | ORAL_TABLET | Freq: Three times a day (TID) | ORAL | Status: DC | PRN

## 2024-08-06 MED ORDER — IOHEXOL 350 MG/ML SOLN
75.0000 mL | Freq: Once | INTRAVENOUS | Status: AC | PRN
  Administered 2024-08-06: 75 mL via INTRAVENOUS

## 2024-08-06 MED ORDER — CYANOCOBALAMIN 1000 MCG PO TABS: 1000.0000 ug | ORAL_TABLET | Freq: Every day | ORAL | Status: DC

## 2024-08-06 MED ORDER — POLYETHYLENE GLYCOL 3350 17 G PO PACK: 17.0000 g | PACK | Freq: Every day | ORAL | Status: AC | PRN

## 2024-08-06 MED ORDER — DOCUSATE SODIUM 100 MG PO CAPS: 100.0000 mg | ORAL_CAPSULE | Freq: Two times a day (BID) | ORAL | Status: AC

## 2024-08-06 MED ORDER — CHLORHEXIDINE GLUCONATE CLOTH 2 % EX PADS
6.0000 | MEDICATED_PAD | Freq: Every day | CUTANEOUS | Status: DC
  Administered 2024-08-08: 6 via TOPICAL

## 2024-08-06 MED ORDER — ENSURE PLUS HIGH PROTEIN PO LIQD: 237.0000 mL | Freq: Two times a day (BID) | ORAL | Status: DC

## 2024-08-06 MED ORDER — FUROSEMIDE 10 MG/ML IJ SOLN
20.0000 mg | Freq: Once | INTRAMUSCULAR | Status: AC
  Administered 2024-08-06: 20 mg via INTRAVENOUS
  Filled 2024-08-06: qty 4

## 2024-08-06 MED ORDER — ORAL CARE MOUTH RINSE: 15.0000 mL | OROMUCOSAL | Status: DC | PRN

## 2024-08-06 MED ORDER — ORAL CARE MOUTH RINSE: 15.0000 mL | OROMUCOSAL | Status: DC

## 2024-08-06 MED ORDER — HEPARIN (PORCINE) 25000 UT/250ML-% IV SOLN
900.0000 [IU]/h | INTRAVENOUS | Status: DC
  Administered 2024-08-06: 900 [IU]/h via INTRAVENOUS
  Filled 2024-08-06: qty 250

## 2024-08-06 MED ORDER — HEPARIN BOLUS VIA INFUSION
3000.0000 [IU] | Freq: Once | INTRAVENOUS | Status: AC
  Administered 2024-08-06: 3000 [IU] via INTRAVENOUS
  Filled 2024-08-06: qty 3000

## 2024-08-06 MED ORDER — OXYCODONE HCL 5 MG PO TABS
5.0000 mg | ORAL_TABLET | Freq: Once | ORAL | Status: AC
  Administered 2024-08-06: 5 mg via ORAL
  Filled 2024-08-06: qty 1

## 2024-08-06 NOTE — TOC Transition Note (Signed)
 Transition of Care [facility]) - Discharge Note   Patient Details  Name: Mandy Adams MRN: [id number] Date of Birth: 1932/07/08  Transition of Care [facility] CM/SW Contact:  NORMAN ASPEN, LCSW Phone Number: 08/06/2024, 10:46 AM   Clinical Narrative:     Pt medically cleared for dc to [facility] SNF today and have received insurance authorization.  Pt and son aware and agreeable.  PTAR called at 10:45am.  RN to call report to [phone].  No further IP CM needs.  Final next level of care: Skilled Nursing Facility Barriers to Discharge: Barriers Resolved   Patient Goals and CMS Choice Patient states their goals for this hospitalization and ongoing recovery are:: return to ALF following SNF rehab          Discharge Placement PASRR number recieved: 08/05/24            Patient chooses bed at: [facility] Patient to be transferred to facility by: PTAR Name of family member notified: son, [name] Patient and family notified of of transfer: 08/06/24  Discharge Plan and Services Additional resources added to the After Visit Summary for   In-house Referral: Clinical Social Work   Post Acute Care Choice: Skilled Nursing Facility          DME Arranged: N/A DME Agency: NA                  Social Drivers of Health (SDOH) Interventions SDOH Screenings   Social Connections: Unknown (03/27/2022)   Received from [facility]  Tobacco Use: Low Risk  (08/03/2024)     Readmission Risk Interventions    08/04/2024    1:35 PM  Readmission Risk Prevention Plan  Transportation Screening Complete  PCP or Specialist Appt within 5-7 Days Complete  Home Care Screening Complete  Medication Review (RN CM) Complete

## 2024-08-06 NOTE — Assessment & Plan Note (Signed)
 Obtain anemia panel  Transfuse for Hg <7 , rapidly dropping or  if symptomatic

## 2024-08-06 NOTE — Subjective & Objective (Addendum)
 Was recently admitted for a fall and left hip fracture and discharged to SNF Had significant SOB CT showed acute PE  consistent with at least submassive With right heart strain now needing 5L Fio2 Elevated pro -BNP 22,330 Heparin  was started

## 2024-08-06 NOTE — Plan of Care (Signed)
  Problem: Nutrition: Goal: Adequate nutrition will be maintained Outcome: Progressing   Problem: Coping: Goal: Level of anxiety will decrease Outcome: Progressing   Problem: Pain Managment: Goal: General experience of comfort will improve and/or be controlled Outcome: Progressing   Problem: Safety: Goal: Ability to remain free from injury will improve Outcome: Progressing   Problem: Skin Integrity: Goal: Risk for impaired skin integrity will decrease Outcome: Progressing   Problem: Activity: Goal: Ability to ambulate and perform ADLs will improve Outcome: Progressing   Problem: Pain Management: Goal: Pain level will decrease Outcome: Progressing

## 2024-08-06 NOTE — Discharge Summary (Signed)
 [facility]  Physician Discharge Summary   Patient ID: Mandy Adams MRN: [id number] DOB/AGE: [birth year] 88 y.o.  Admit date: 08/03/2024 Discharge date:   08/06/2024   PCP: Okey Carlin Redbird, MD  DISCHARGE DIAGNOSES:    Closed subcapital fracture of neck of femur, left, initial encounter (HCC)   Leukocytosis   Diaphragmatic hernia without obstruction and without gangrene   Adjustment disorder   Gastro-esophageal reflux disease without esophagitis   Moderate dementia (HCC)   Stage 3 chronic kidney disease (HCC) Hypotension likely due to hypovolemia, improved Acute kidney injury, improved Vitamin B12 deficiency  RECOMMENDATIONS FOR OUTPATIENT FOLLOW UP: Please check CBC and basic metabolic panel in 3 to 4 days Needs follow-up with orthopedics in 2 weeks for wound recheck and suture removal   Home Health: SNF Equipment/Devices: None  CODE STATUS: DNR  DISCHARGE CONDITION: fair  Diet recommendation: Regular as tolerated  INITIAL HISTORY: 89 y.o. female with medical history significant of AKI, CAP, dementia, hyperlipidemia, hypertension, leukocytosis, GERD, eczema, aortic atherosclerosis, stage IIIa CKD who was brought to the emergency department due to having an unwitnessed fall at her facility injuring her left hip area with inability to bear weight on her LLE.  Thought to be a mechanical fall.  She was found to have left hip fracture.  She was hospitalized for further management   Consultants: Orthopedics   Procedures: Cemented hemiarthroplasty left femoral neck fracture on 9/22    HOSPITAL COURSE:   Left hip fracture Status post surgery on 9/22.  Orthopedics following.  Pain appears to be reasonably well-controlled. Noted to be on aspirin  twice a day for DVT prophylaxis. Seen by physical therapy.  SNF or rehab is recommended.   Acute kidney injury/chronic kidney disease stage IIIa/metabolic acidosis Creatinine was 1.24 in February.  Came in with a  creatinine of 1.23.  Creatinine worsened to 1.67.  Patient thought to be hypovolemic with blood loss also contributing.  Patient was transfused PRBC and given IV fluids with improvement in renal function today.   Bicarbonate level has improved.   Normocytic anemia/acute blood loss anemia Vitamin B12 deficiency Baseline hemoglobin seems to be around 11.  Drop in hemoglobin most likely due to fracture and surgery.  Hemoglobin dropped down to 7.3.  She was symptomatic so she was transfused 1 unit of PRBC with improvement.   Anemia panel was done.  Ferritin is 84.  Iron is 17, TIBC 288, percent saturation 6.  Folic acid 9.0.  Vitamin B12 is low normal at 249.  Continue B12 supplementation.     Hypotension  She is not on any blood pressure lowering agents prior to admission.  Looks like her metoprolol was recently discontinued by outpatient providers. Low blood pressures noted postoperatively.  Given blood transfusion along with IV fluids with improvement in blood pressure.      Lactic acidosis This was minimally elevated.  Improvement noted.  No further workup anticipated at this time.   Leukocytosis Most likely reactive.  She remains afebrile   Diaphragmatic hernia without obstruction and without gangrene Seems to be asymptomatic.  Probably a chronic issue.  Continue PPI. History of dementia Continue memantine    History of adjustment disorder Continue her psychotropic agents.   L1 vertebral body compression fracture Age indeterminate L1 vertebral body compression fracture noted incidentally on CT scan.  Noted to be asymptomatic.    Patient is stable.  Okay for discharge to SNF today.  Discussed with her son.  PERTINENT LABS:  The results of significant diagnostics from  this hospitalization (including imaging, microbiology, ancillary and laboratory) are listed below for reference.    Labs:   Basic Metabolic Panel: Recent Labs  Lab 08/03/24 0937 08/03/24 1848 08/04/24 0341  08/05/24 0343 08/06/24 0332  NA 141 137 137 137 141  K 4.1 4.8 4.2 4.1 4.5  CL 108 104 106 109 113*  CO2 18* 20* 19* 16* 18*  GLUCOSE 102* 138* 116* 156* 117*  BUN 30* 31* 34* 39* 39*  CREATININE 1.23* 1.50* 1.63* 1.67* 1.41*  CALCIUM  8.3* 8.4* 8.1* 7.8* 8.3*  MG 2.1  --   --   --   --    Liver Function Tests: Recent Labs  Lab 08/03/24 0937  AST 27  ALT 11  ALKPHOS 75  BILITOT 0.5  PROT 6.0*  ALBUMIN  3.4*   Recent Labs  Lab 08/03/24 0937  LIPASE 14    CBC: Recent Labs  Lab 08/03/24 0910 08/03/24 1848 08/04/24 0341 08/04/24 1857 08/05/24 0343 08/05/24 1534 08/06/24 0332  WBC 19.1* 15.5* 12.1*  --  10.6*  --  12.4*  HGB 10.8* 8.9* 8.2* 7.3* 7.3* 9.9* 9.1*  HCT 35.3* 30.0* 27.9* 23.6* 25.0* 32.7* 30.3*  MCV 86.5 88.5 90.0  --  90.6  --  89.6  PLT 327 197 166  --  208  --  190   Cardiac Enzymes: Recent Labs  Lab 08/03/24 0937  CKTOTAL 63   CBG: Recent Labs  Lab 08/03/24 0919  GLUCAP 105*     IMAGING STUDIES DG Pelvis Portable Result Date: 08/03/2024 EXAM: 1 or 2 VIEW(S) XRAY OF THE PELVIS 08/03/2024 04:54:00 PM COMPARISON: Earlier today. CLINICAL HISTORY: Deficient knowledge of open reduction and internal (ORIF) fixation of hip [id number]. Status post left arthoplasty. FINDINGS: BONES AND JOINTS: Left hip arthroplasty in place. No periprosthetic lucency or fracture. SOFT TISSUES: Subcutaneous soft tissue edema and emphysema consistent with postsurgical changes. IMPRESSION: 1. Left hip arthroplasty without immediate post-operative complication. Electronically signed by: Andrea Gasman MD 08/03/2024 06:08 PM EDT RP Workstation: [id number]   DG HIP UNILAT WITH PELVIS 1V LEFT Result Date: 08/03/2024 EXAM: 1 VIEW XRAY OF THE LEFT HIP 08/03/2024 03:50:00 PM COMPARISON: 08/03/24 CLINICAL HISTORY: [id number] Surgery, elective [id number]. FINDINGS: BONES AND JOINTS: Seven fluoroscopic spot views of the pelvis and left hip submitted from the operating room. Sequential images  during left hip arthroplasty. Fluoro time: 3 sec, Fluoro dose: .2068 mGy. IMPRESSION: 1. Procedural fluoroscopy during left hip arthroplasty . Electronically signed by: Andrea Gasman MD 08/03/2024 06:06 PM EDT RP Workstation: [id number]   DG C-Arm 1-60 Min-No Report Result Date: 08/03/2024 Fluoroscopy was utilized by the requesting physician.  No radiographic interpretation.   DG C-Arm 1-60 Min-No Report Result Date: 08/03/2024 Fluoroscopy was utilized by the requesting physician.  No radiographic interpretation.   CT HEAD WO CONTRAST Result Date: 08/03/2024 CLINICAL DATA:  Head trauma, fall, injury EXAM: CT HEAD WITHOUT CONTRAST TECHNIQUE: Contiguous axial images were obtained from the base of the skull through the vertex without intravenous contrast. RADIATION DOSE REDUCTION: This exam was performed according to the departmental dose-optimization program which includes automated exposure control, adjustment of the mA and/or kV according to patient size and/or use of iterative reconstruction technique. COMPARISON:  August 02, 2020 MRI FINDINGS: Brain: Nonspecific periventricular and subcortical white matter hypodensities, likely secondary to chronic microvascular disease. No acute intracranial hemorrhage. Diffuse cerebral atrophy. No midline shift. Basal cisterns are patent. Vascular: Unremarkable. Skull: No acute findings. Sinuses/Orbits: Opacification of the right sphenoid sinus. Other: None. IMPRESSION: No acute  intracranial pathology. Chronic white matter changes and diffuse cerebral atrophy. Opacification of the right sphenoid sinus. Electronically Signed   By: Michaeline Blanch M.D.   On: 08/03/2024 12:07   CT CHEST ABDOMEN PELVIS WO CONTRAST Result Date: 08/03/2024 CLINICAL DATA:  Blunt poly trauma, fall, injury EXAM: CT CHEST, ABDOMEN AND PELVIS WITHOUT CONTRAST TECHNIQUE: Multidetector CT imaging of the chest, abdomen and pelvis was performed following the standard protocol without IV contrast.  RADIATION DOSE REDUCTION: This exam was performed according to the departmental dose-optimization program which includes automated exposure control, adjustment of the mA and/or kV according to patient size and/or use of iterative reconstruction technique. COMPARISON:  Same day additional trauma studies and priors FINDINGS: Limited evaluation of the solid organs and vasculature due to lack of IV contrast. CT CHEST FINDINGS Cardiovascular: Normal caliber aorta. Scattered aortic calcifications. No pericardial effusion. Mediastinum/Nodes: Large left-sided diaphragmatic hernia containing a majority of the stomach and multiple loops of large bowel with chronic organo-axial rotation of the stomach and mass effect on the adjacent heart and lungs. No lymphadenopathy. Lungs/Pleura: Similar appearance of biapical pleuroparenchymal scarring and calcified sub pleural plaques. Chronic left lower lobe atelectasis/consolidation related to large left diaphragmatic hernia. Right basilar atelectasis. No pleural effusions or pneumothorax. Musculoskeletal: Age-indeterminate L1 vertebral body compression fracture. CT ABDOMEN PELVIS FINDINGS Hepatobiliary: Unremarkable. Pancreas: Unremarkable. Spleen: Unremarkable. Adrenals/Urinary Tract: Adrenal glands are unremarkable. No hydronephrosis or nephrolithiasis. Stomach/Bowel: No evidence of bowel obstruction or inflammation. Colonic diverticulosis. Large left-sided diaphragmatic hernia as described above. Vascular/Lymphatic: Normal caliber abdominal aorta. Reproductive: Status post hysterectomy. No adnexal masses. Other: No free air or free fluid. Musculoskeletal: Age-indeterminate L1 vertebral body compression fracture. Mildly displaced and angulated left femoral neck fracture. Multilevel degenerative vertebral changes. IMPRESSION: Limited evaluation of the solid organs and vasculature without IV contrast. 1.  Redemonstration of left femoral neck fracture. 2.  Age indeterminate L1 vertebral  body compression fracture. 3. Unchanged large, chronic left-sided diaphragmatic hernia with organoaxial rotation of the stomach and mass effect on the adjacent lung and mediastinum. No evidence of bowel obstruction. Electronically Signed   By: Michaeline Blanch M.D.   On: 08/03/2024 12:03   CT CERVICAL SPINE WO CONTRAST Result Date: 08/03/2024 CLINICAL DATA:  Poly trauma, blood. Unwitnessed fall. History of hypertension and dementia. EXAM: CT CERVICAL SPINE WITHOUT CONTRAST TECHNIQUE: Multidetector CT imaging of the cervical spine was performed without intravenous contrast. Multiplanar CT image reconstructions were also generated. RADIATION DOSE REDUCTION: This exam was performed according to the departmental dose-optimization program which includes automated exposure control, adjustment of the mA and/or kV according to patient size and/or use of iterative reconstruction technique. COMPARISON:  Chest CT 04/30/2018 FINDINGS: Alignment: 3 mm of anterolisthesis at C3-4 appears degenerative. Skull base and vertebrae: No evidence of acute fracture or traumatic subluxation. There is evidence for interbody and at least partial interfacetal ankylosis at the C2-3 and C4-5 levels. Mild asymmetric widening of the left C3-4 facet joint. Soft tissues and spinal canal: No prevertebral fluid or swelling. No visible canal hematoma. Disc levels: Multilevel spondylosis with disc space narrowing, uncinate spurring and facet hypertrophy. As above, interbody and at least partial interfacetal ankylosis at the C2-3 and C4-5 levels. Degenerative appearing anterolisthesis at C3-4 with resulting mild spinal stenosis and mild to moderate foraminal narrowing bilaterally. Upper chest: Chest CT findings dictated separately. Other: Bilateral carotid atherosclerosis. IMPRESSION: 1. No evidence of acute cervical spine fracture, traumatic subluxation or static signs of instability. 2. Multilevel cervical spondylosis as described with degenerative  anterolisthesis at  C3-4. 3. 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 Electronically Signed   By: Elsie Perone M.D.   On: 08/03/2024 11:43   DG Hip Unilat W or Wo Pelvis 2-3 Views Left Result Date: 08/03/2024 CLINICAL DATA:  Fall, left leg pain. EXAM: DG HIP (WITH OR WITHOUT PELVIS) 2-3V LEFT COMPARISON:  None Available. FINDINGS: Displaced left femoral neck fracture with superior displacement of the distal fracture fragment and varus angulation. No additional evidence of an acute fracture. Degenerative changes in the lower lumbosacral spine are incidentally imaged. IMPRESSION: Left femoral neck fracture. Electronically Signed   By: Newell Eke M.D.   On: 08/03/2024 10:24   DG Chest Port 1 View Result Date: 08/03/2024 CLINICAL DATA:  Fall, left leg pain. EXAM: PORTABLE CHEST 1 VIEW COMPARISON:  01/02/2024 and CT chest 04/30/2018. FINDINGS: Trachea is midline. Heart is at the upper limits of normal in size to mildly enlarged, stable. Thoracic aorta is calcified. Very large hiatal hernia in the lower left hemithorax. Lungs are clear. No pleural fluid. IMPRESSION: 1. No acute findings. 2. Large hiatal hernia. Electronically Signed   By: Newell Eke M.D.   On: 08/03/2024 10:24    DISCHARGE EXAMINATION: Vitals:   08/05/24 1330 08/05/24 1443 08/05/24 2037 08/06/24 0414  BP: 105/71 114/87 106/75 130/76  Pulse: 97 100 95 99  Resp: 18 20 18 16   Temp: 98.6 F (37 C)   97.6 F (36.4 C)  TempSrc: Oral   Oral  SpO2:  98% 99% 100%  Weight:      Height:       General appearance: Awake alert.  In no distress Resp: Clear to auscultation bilaterally.  Normal effort Cardio: S1-S2 is normal regular.  No S3-S4.  No rubs murmurs or bruit GI: Abdomen is soft.  Nontender nondistended.  Bowel sounds are present normal.  No masses organomegaly    DISPOSITION: SNF  Discharge Instructions     Call MD for:  difficulty breathing, headache or visual disturbances   Complete  by: As directed    Call MD for:  extreme fatigue   Complete by: As directed    Call MD for:  persistant dizziness or light-headedness   Complete by: As directed    Call MD for:  persistant nausea and vomiting   Complete by: As directed    Call MD for:  severe uncontrolled pain   Complete by: As directed    Call MD for:  temperature >100.4   Complete by: As directed    Diet - low sodium heart healthy   Complete by: As directed    Discharge instructions   Complete by: As directed    Please review instructions on the discharge summary.  You were cared for by a hospitalist during your hospital stay. If you have any questions about your discharge medications or the care you received while you were in the hospital after you are discharged, you can call the unit and asked to speak with the hospitalist on call if the hospitalist that took care of you is not available. Once you are discharged, your primary care physician will handle any further medical issues. Please note that NO REFILLS for any discharge medications will be authorized once you are discharged, as it is imperative that you return to your primary care physician (or establish a relationship with a primary care physician if you do not have one) for your aftercare needs so that they can reassess your need for medications and  monitor your lab values. If you do not have a primary care physician, you can call [phone] for a physician referral.   Increase activity slowly   Complete by: As directed    No wound care   Complete by: As directed    Weight bearing as tolerated   Complete by: As directed         Allergies as of 08/06/2024   No Known Allergies      Medication List     STOP taking these medications    Calmoseptine 0.44-20.6 % Oint Generic drug: Menthol -Zinc Oxide   diclofenac Sodium 1 % Gel Commonly known as: VOLTAREN   estradiol 0.1 MG/GM vaginal cream Commonly known as: ESTRACE   mineral oil-hydrophilic  petrolatum ointment   mupirocin  ointment 2 % Commonly known as: BACTROBAN    Toprol XL 25 MG 24 hr tablet Generic drug: metoprolol succinate       TAKE these medications    acetaminophen  325 MG tablet Commonly known as: TYLENOL  Take 650 mg by mouth every 6 (six) hours as needed for moderate pain (pain score 4-6) or mild pain (pain score 1-3).   aspirin  EC 325 MG tablet Take 1 tablet (325 mg total) by mouth in the morning and at bedtime for 28 days.   cetirizine 10 MG tablet Commonly known as: ZYRTEC Take 10 mg by mouth daily.   cyanocobalamin  1000 MCG tablet Take 1 tablet (1,000 mcg total) by mouth daily.   docusate sodium  100 MG capsule Commonly known as: COLACE Take 1 capsule (100 mg total) by mouth 2 (two) times daily.   feeding supplement Liqd Take 237 mLs by mouth 2 (two) times daily between meals.   Flonase Allergy Relief 50 MCG/ACT nasal spray Generic drug: fluticasone Place 1 spray into both nostrils daily.   HYDROcodone -acetaminophen  5-325 MG tablet Commonly known as: NORCO/VICODIN Take 1 tablet by mouth every 6 (six) hours as needed for moderate pain (pain score 4-6).   meclizine  25 MG tablet Commonly known as: ANTIVERT  Take 25 mg by mouth every 8 (eight) hours as needed (Vertigo).   memantine  10 MG tablet Commonly known as: NAMENDA  Take 1 tablet (10 mg total) by mouth 2 (two) times daily. What changed: when to take this   methocarbamol  500 MG tablet Commonly known as: ROBAXIN  Take 1 tablet (500 mg total) by mouth every 8 (eight) hours as needed for muscle spasms.   mirtazapine  15 MG tablet Commonly known as: REMERON  Take 15 mg by mouth at bedtime.   pantoprazole  20 MG tablet Commonly known as: PROTONIX  Take 20 mg by mouth daily.   polyethylene glycol 17 g packet Commonly known as: MIRALAX  / GLYCOLAX  Take 17 g by mouth daily as needed for mild constipation.   sertraline  50 MG tablet Commonly known as: ZOLOFT  Take 50 mg by mouth daily.    VITAMIN D PO Take 50 mcg by mouth daily.               Discharge Care Instructions  (From admission, onward)           Start     Ordered   08/03/24 0000  Weight bearing as tolerated        08/03/24 1404              Contact information for follow-up providers     Jule Shuck L, PA-C Follow up in 2 week(s).   Specialty: Orthopedic Surgery Why: For suture removal, For wound re-check Contact information: [address]  [place] [address] [phone]              Contact information for after-discharge care     Destination     [facility] [place] .   Service: Skilled Nursing Contact information: [address] [place]  [phone]                     TOTAL DISCHARGE TIME: 35 minutes  Meliss Fleek Verdene  [facility] Pager on [email or web address]  08/06/2024, 9:29 AM

## 2024-08-06 NOTE — Assessment & Plan Note (Signed)
 this patient has acute respiratory failure with Hypoxia   as documented by the presence of following: O2 saturatio< 90% on RA   Likely due to:PE Provide O2 therapy and titrate as needed  Continuous pulse ox

## 2024-08-06 NOTE — Progress Notes (Signed)
 Occupational Therapy Treatment Patient Details Name: Mandy Adams MRN: [id number] DOB: 11-04-1932 Today's Date: 08/06/2024   History of present illness Pt is a 89 yr old female presenting after an unwitnessed fall at ALF. Imaging showed L femoral neck fracture. Pt underwent L THA (anterior approach) on 9/22. L1 vertebral body compression fracture. Age indeterminate L1 vertebral body compression fracture noted incidentally on CT scan. PMH: AKI, CAP, dementia, HLD, HTN, leukocytosis, GERD, eczema, aortic atherosclerosis, CKD3   OT comments  The pt was seen for functional strengthening, progression of functional activity, and ADL instruction. She required mod assist for supine to sit, set-up/supervision for upper body grooming seated EOB, min assist to  stand from EOB using a RW, and mod assist to step-pivot to the bedside chair using a RW. She required consistent cues for short-term memory/recall throughout the session; for instance, she could not recall having a fall and subsequent LLE surgery. She reported mild dizziness seated EOB. Her blood pressure was taken and noted to be 138/82. She presented with good effort and participation. She will benefit from further OT services to maximize her safety and independence with self-care tasks & to decrease the risk for restricted participation in meaningful activities. Patient will benefit from continued inpatient follow up therapy, <3 hours/day.      If plan is discharge home, recommend the following:  A lot of help with walking and/or transfers;A lot of help with bathing/dressing/bathroom;Supervision due to cognitive status;Direct supervision/assist for medications management;Assistance with cooking/housework;Direct supervision/assist for financial management   Equipment Recommendations  Other (comment) (to be determined, pending progress at next setting)    Recommendations for Other Services      Precautions / Restrictions  Precautions Precautions: Fall Restrictions LLE Weight Bearing Per Provider Order: Weight bearing as tolerated       Mobility Bed Mobility Overal bed mobility: Needs Assistance Bed Mobility: Supine to Sit     Supine to sit: Mod assist, HOB elevated, Used rails     General bed mobility comments: Required increased time and cues for sequencing, including advancing BLE off the bed, as well as reaching with upper extremity to pull on bedrail Patient Response: Cooperative  Transfers Overall transfer level: Needs assistance Equipment used: Rolling walker (2 wheels) Transfers: Sit to/from Stand, Bed to chair/wheelchair/BSC Sit to Stand: Min assist, From elevated surface, +2 physical assistance     Step pivot transfers: Mod assist, +2 physical assistance     General transfer comment: required cues to push up from the bed with one upper extremity, as well as cues for trunk extension in standing, walker advancement, body positioning and reaching back prior to sitting down     Balance       Sitting balance - Comments: static sitting-good. dynamic sitting-fair+     Standing balance-Leahy Scale: Poor       ADL either performed or assessed with clinical judgement   ADL Overall ADL's : Needs assistance/impaired Eating/Feeding: Set up;Bed level Eating/Feeding Details (indicate cue type and reason): Pt was observed self-feeding breakfast in bed at the start of the session. Grooming: Set up;Wash/dry face;Sitting;Supervision/safety;Brushing hair Grooming Details (indicate cue type and reason): The pt performed face washing and hair combing seated upright at edge of bed.          Communication Communication Communication: No apparent difficulties   Cognition Arousal: Alert Behavior During Therapy: WFL for tasks assessed/performed Cognition: History of cognitive impairments, Cognition impaired   Orientation impairments: Situation, Time   Memory impairment (select all  impairments):  Short-term memory, Declarative long-term memory, Working memory Attention impairment (select first level of impairment): Sustained attention, Divided attention Executive functioning impairment (select all impairments): Organization, Problem solving OT - Cognition Comments: History of dementia, pleasantly confused, able to follow 1 step commands consistently, difficulty following multi-step commands               Cueing   Cueing Techniques: Verbal cues, Gestural cues, Tactile cues             Pertinent Vitals/ Pain       Pain Assessment Pain Assessment: Faces Pain Score: 5  Pain Location: L hip Pain Descriptors / Indicators: Grimacing, Sore Pain Intervention(s): Limited activity within patient's tolerance, Monitored during session, Repositioned, Ice applied   Frequency  Min 2X/week        Progress Toward Goals  OT Goals(current goals can now be found in the care plan section)     Acute Rehab OT Goals OT Goal Formulation: With patient Time For Goal Achievement: 08/18/24 Potential to Achieve Goals: Good  Plan         AM-PAC OT 6 Clicks Daily Activity     Outcome Measure   Help from another person eating meals?: None Help from another person taking care of personal grooming?: A Little Help from another person toileting, which includes using toliet, bedpan, or urinal?: A Lot Help from another person bathing (including washing, rinsing, drying)?: A Lot Help from another person to put on and taking off regular upper body clothing?: A Little Help from another person to put on and taking off regular lower body clothing?: A Lot 6 Click Score: 16    End of Session Equipment Utilized During Treatment: Gait belt;Rolling walker (2 wheels);Oxygen  OT Visit Diagnosis: Muscle weakness (generalized) (M62.81);History of falling (Z91.81);Other symptoms and signs involving cognitive function;Pain;Unsteadiness on feet (R26.81);Other abnormalities of gait and  mobility (R26.89) Pain - Right/Left: Left Pain - part of body: Hip   Activity Tolerance Patient limited by pain   Patient Left in chair;with chair alarm set;with call bell/phone within reach   Nurse Communication Other (comment) (N/A)        Time: 8989-8961 OT Time Calculation (min): 28 min  Charges: OT General Charges $OT Visit: 1 Visit OT Treatments $Self Care/Home Management : 8-22 mins $Therapeutic Activity: 8-22 mins    Delanna JINNY Lesches, OTR/L 08/06/2024, 3:07 PM

## 2024-08-06 NOTE — Progress Notes (Signed)
 PHARMACY - ANTICOAGULATION CONSULT NOTE  Pharmacy Consult for Heparin  Indication: pulmonary embolus  No Known Allergies  Patient Measurements:    Vital Signs: Temp: 97.4 F (36.3 C) (09/25 1841) Temp Source: Oral (09/25 1841) BP: 127/83 (09/25 1930) Pulse Rate: 94 (09/25 1930)  Labs: Recent Labs    08/05/24 0343 08/05/24 1534 08/06/24 0332 08/06/24 1538 08/06/24 1545  HGB 7.3*   < > 9.1* 9.9* 9.7*  HCT 25.0*   < > 30.3* 29.0* 31.5*  PLT 208  --  190  --  PLATELET CLUMPS NOTED ON SMEAR, UNABLE TO ESTIMATE  CREATININE 1.67*  --  1.41* 1.30* 1.24*   < > = values in this interval not displayed.    Estimated Creatinine Clearance: 21.7 mL/min (A) (by C-G formula based on SCr of 1.24 mg/dL (H)).   Medical History: Past Medical History:  Diagnosis Date   AKI (acute kidney injury) 04/30/2018   Community acquired pneumonia 04/30/2018   Dementia (HCC)    Hyperlipidemia    Hypertension    Leukocytosis 04/30/2018    Assessment: Active Problem(s): sudden onset of SOB with cyanosis from [facility] (just d/c'd from hospital 2 HOURS ago)  PMH: AKI, CAP, dementia, HLD, HTN, leukocytosis, GERD, eczema, aortic atherosclerosis, CKD3, dementia,  Significant events: Fall at ALF, L femoral neck fracture. Pt underwent L THA (anterior approach) on 9/22.    AC/Heme: PE. Start IV heparin  S/p THA 08/03/24 (ASA 325mg  BID post-op)  - Hgb 99.7, Plts clumped  - 9/25 CT: Pulmonary emboli within all lobes of the right lung. CT evidence of right heart strain (RV/LV Ratio = 1.15) consistent with at least submassive (intermediate risk) PE  Goal of Therapy:  Heparin  level 0.3-0.7 units/ml Monitor platelets by anticoagulation protocol: Yes   Plan:  Heparin  3000 unit IV bolus Heparin  infusion at 900 units/hr Check heparin  level in 8 hrs Daily HL and CBC   Cassandria Drew Karoline Marina, PharmD, BCPS Clinical Staff Pharmacist Marina Salines Stillinger 08/06/2024,7:51 PM

## 2024-08-06 NOTE — Assessment & Plan Note (Signed)
 Continue Namenda  monitor for any sign of sundowning

## 2024-08-06 NOTE — Assessment & Plan Note (Addendum)
 Admit to  stepdown Initiate heparin  drip  Would likely benefit from case manager consult for long term anticoagulation Hold home blood pressure medications avoid hypotension Cycle cardiac enzymes Order echogram and lower extremity Dopplers  Most likely risk factors for hypercoagulable state being recent surgery   Given sub-massive PE [facility] M consulted and code PE activated, per PCCM patient not a candidate for tPA okay to admit to stepdown reconsult critical care if patient decompensates

## 2024-08-06 NOTE — ED Triage Notes (Addendum)
 Pt BIBA from [facility] for sudden onset shortness of breath.  Per EMS pt was discharged from hospital 2 hours ago, developed shortness of breath with cyanosis  Denies chest pain.

## 2024-08-06 NOTE — Assessment & Plan Note (Signed)
 Allow permissive htn ?

## 2024-08-06 NOTE — Assessment & Plan Note (Signed)
-  chronic avoid nephrotoxic medications such as NSAIDs, Vanco Zosyn combo,  avoid hypotension, continue to follow renal function

## 2024-08-06 NOTE — Assessment & Plan Note (Signed)
 Continue PPI.

## 2024-08-06 NOTE — H&P (Signed)
 Mandy Adams FMW:[id number] DOB: [birth year] DOA: 08/06/2024     PCP: Patient, No Pcp Per     Patient arrived to ER on 08/06/24 at 1454 Referred by Attending Franklyn Sid SAILOR, MD   Patient coming from:   From facility   SNF   Chief Complaint:   Chief Complaint  Patient presents with   Shortness of Breath    HPI: Mandy Adams is a 89 y.o. female with medical history significant of dementia GERD recent hip replacement    Presented with shortness of breath Was recently admitted for a fall and left hip fracture and discharged to SNF Had significant SOB CT showed acute PE  consistent with at least submassive With right heart strain now needing 5L Fio2 Elevated pro -BNP 22,330 Heparin  was started      Denies significant ETOH intake   Does not smoke      Regarding pertinent Chronic problems:        CKD stage IIIa baseline Cr 1.2 Estimated Creatinine Clearance: 21.7 mL/min (A) (by C-G formula based on SCr of 1.24 mg/dL (H)).  Lab Results  Component Value Date   CREATININE 1.24 (H) 08/06/2024   CREATININE 1.30 (H) 08/06/2024   CREATININE 1.41 (H) 08/06/2024   Lab Results  Component Value Date   NA 140 08/06/2024   CL 113 (H) 08/06/2024   K 4.0 08/06/2024   CO2 18 (L) 08/06/2024   BUN 34 (H) 08/06/2024   CREATININE 1.24 (H) 08/06/2024   GFRNONAA 41 (L) 08/06/2024   CALCIUM  8.3 (L) 08/06/2024   ALBUMIN  3.4 (L) 08/03/2024   GLUCOSE 124 (H) 08/06/2024         Dementia - on   Nemenda    Chronic anemia - baseline hg Hemoglobin & Hematocrit  Recent Labs    08/06/24 0332 08/06/24 1538 08/06/24 1545  HGB 9.1* 9.9* 9.7*   Iron/TIBC/Ferritin/ %Sat    Component Value Date/Time   IRON 17 (L) 08/05/2024 0343   TIBC 288 08/05/2024 0343   FERRITIN 84 08/05/2024 0343   IRONPCTSAT 6 (L) 08/05/2024 0343     While in ER: Clinical Course as of 08/06/24 1947  Thu Aug 06, 2024  1648 Basic metabolic panel(!) Similar to yesterday [HN]  1648 Lactic Acid,  Venous: 1.8 neg [HN]  1648 Pro Brain Natriuretic Peptide(!): 22,337.0 Very elevated [HN]  1935 Cardiology patient has PEs throughout her right lung as well as right heart strain. SPESI is high. Will start heparin  as well as IV lasix  given elevated BNP and BL pleural effusions. She is mildly dyspneic and requiring 5L  but is not in resp distress, do not believe she requires emergent thoracentesis. Consult for admission to medicine. [HN]    Clinical Course User Index [HN] Franklyn Sid SAILOR, MD     Lab Orders         Resp panel by RT-PCR (RSV, Flu A&B, Covid) Anterior Nasal Swab         CBC with Differential         Basic metabolic panel         Pro Brain natriuretic peptide         Blood gas, venous (at [facility] and [facility])         I-stat chem 8, ed       CTA chest -submassive PE,    Following Medications were ordered in ER: Medications  oxyCODONE  (Oxy IR/ROXICODONE ) immediate release tablet 5 mg (has no administration in time range)  furosemide  (  LASIX ) injection 20 mg (has no administration in time range)  iohexol  (OMNIPAQUE ) 350 MG/ML injection 75 mL (75 mLs Intravenous Contrast Given 08/06/24 1913)    _______________________________________________________ ER Provider Called:   PCCM they Recommend admit to medicine stepdown      ED Triage Vitals [08/06/24 1517]  Encounter Vitals Group     BP 123/75     Girls Systolic BP Percentile      Girls Diastolic BP Percentile      Boys Systolic BP Percentile      Boys Diastolic BP Percentile      Pulse Rate (!) 101     Resp (!) 22     Temp 98 F (36.7 C)     Temp Source Oral     SpO2 91 %     Weight      Height      Head Circumference      Peak Flow      Pain Score 0     Pain Loc      Pain Education      Exclude from Growth Chart   TMAX(24)@     _________________________________________ Significant initial  Findings: Abnormal Labs Reviewed  CBC WITH DIFFERENTIAL/PLATELET - Abnormal; Notable for the following components:       Result Value   WBC 11.6 (*)    RBC 3.53 (*)    Hemoglobin 9.7 (*)    HCT 31.5 (*)    RDW 17.2 (*)    Neutro Abs 8.9 (*)    Abs Immature Granulocytes 0.21 (*)    All other components within normal limits  BASIC METABOLIC PANEL WITH GFR - Abnormal; Notable for the following components:   Chloride 113 (*)    CO2 18 (*)    Glucose, Bld 124 (*)    BUN 34 (*)    Creatinine, Ser 1.24 (*)    Calcium  8.3 (*)    GFR, Estimated 41 (*)    All other components within normal limits  PRO BRAIN NATRIURETIC PEPTIDE - Abnormal; Notable for the following components:   Pro Brain Natriuretic Peptide 22,337.0 (*)    All other components within normal limits  BLOOD GAS, VENOUS - Abnormal; Notable for the following components:   pO2, Ven <31 (*)    Bicarbonate 19.7 (*)    Acid-base deficit 7.4 (*)    All other components within normal limits  I-STAT CHEM 8, ED - Abnormal; Notable for the following components:   Chloride 113 (*)    BUN 32 (*)    Creatinine, Ser 1.30 (*)    Glucose, Bld 121 (*)    TCO2 18 (*)    Hemoglobin 9.9 (*)    HCT 29.0 (*)    All other components within normal limits      _________________________ Troponin 69   ECG: Ordered   Pro BNP 22,337.0   Recent Labs    08/05/24 0343  FERRITIN 84       The recent clinical data is shown below. Vitals:   08/06/24 1517 08/06/24 1518 08/06/24 1841 08/06/24 1930  BP: 123/75  124/73 127/83  Pulse: (!) 101  (!) 53 94  Resp: (!) 22  (!) 22 20  Temp: 98 F (36.7 C)  (!) 97.4 F (36.3 C)   TempSrc: Oral  Oral   SpO2: 91% 91% 99% 95%    WBC     Component Value Date/Time   WBC 11.6 (H) 08/06/2024 1545   LYMPHSABS 1.2 08/06/2024 1545  MONOABS 0.9 08/06/2024 1545   EOSABS 0.3 08/06/2024 1545   BASOSABS 0.1 08/06/2024 1545         UA   no evidence of UTI    Urine analysis:    Component Value Date/Time   COLORURINE YELLOW 08/04/2024 0130   APPEARANCEUR CLEAR 08/04/2024 0130   LABSPEC 1.019 08/04/2024 0130    PHURINE 5.0 08/04/2024 0130   GLUCOSEU NEGATIVE 08/04/2024 0130   HGBUR NEGATIVE 08/04/2024 0130   BILIRUBINUR NEGATIVE 08/04/2024 0130   KETONESUR NEGATIVE 08/04/2024 0130   PROTEINUR NEGATIVE 08/04/2024 0130   NITRITE NEGATIVE 08/04/2024 0130   LEUKOCYTESUR NEGATIVE 08/04/2024 0130    Results for orders placed or performed during the hospital encounter of 08/06/24  Resp panel by RT-PCR (RSV, Flu A&B, Covid) Anterior Nasal Swab     Status: None   Collection Time: 08/06/24  6:45 PM   Specimen: Anterior Nasal Swab  Result Value Ref Range Status   SARS Coronavirus 2 by RT PCR NEGATIVE NEGATIVE Final         Influenza A by PCR NEGATIVE NEGATIVE Final   Influenza B by PCR NEGATIVE NEGATIVE Final         Resp Syncytial Virus by PCR NEGATIVE NEGATIVE Final           ________________________________________________________________  Venous  Blood Gas result:  pH  7.25 Acid-base deficit 7.4 High  mmol/L   pCO2, Ven 45 mmHg O2 Saturation 37.3 %  pO2, Ven <31 Low Panic  mmHg     __________________________________________________________ Recent Labs  Lab 08/03/24 0937 08/03/24 1848 08/04/24 0341 08/05/24 0343 08/06/24 0332 08/06/24 1538 08/06/24 1545  NA 141 137 137 137 141 142 140  K 4.1 4.8 4.2 4.1 4.5 3.9 4.0  CO2 18* 20* 19* 16* 18*  --  18*  GLUCOSE 102* 138* 116* 156* 117* 121* 124*  BUN 30* 31* 34* 39* 39* 32* 34*  CREATININE 1.23* 1.50* 1.63* 1.67* 1.41* 1.30* 1.24*  CALCIUM  8.3* 8.4* 8.1* 7.8* 8.3*  --  8.3*  MG 2.1  --   --   --   --   --   --     Cr   stable,   Lab Results  Component Value Date   CREATININE 1.24 (H) 08/06/2024   CREATININE 1.30 (H) 08/06/2024   CREATININE 1.41 (H) 08/06/2024    Recent Labs  Lab 08/03/24 0937  AST 27  ALT 11  ALKPHOS 75  BILITOT 0.5  PROT 6.0*  ALBUMIN  3.4*   Lab Results  Component Value Date   CALCIUM  8.3 (L) 08/06/2024    Plt: Lab Results  Component Value Date   PLT PLATELET CLUMPS NOTED ON SMEAR, UNABLE TO  ESTIMATE 08/06/2024       Recent Labs  Lab 08/03/24 1848 08/04/24 0341 08/04/24 1857 08/05/24 0343 08/05/24 1534 08/06/24 0332 08/06/24 1538 08/06/24 1545  WBC 15.5* 12.1*  --  10.6*  --  12.4*  --  11.6*  NEUTROABS  --   --   --   --   --   --   --  8.9*  HGB 8.9* 8.2*   < > 7.3* 9.9* 9.1* 9.9* 9.7*  HCT 30.0* 27.9*   < > 25.0* 32.7* 30.3* 29.0* 31.5*  MCV 88.5 90.0  --  90.6  --  89.6  --  89.2  PLT 197 166  --  208  --  190  --  PLATELET CLUMPS NOTED ON SMEAR, UNABLE TO ESTIMATE   < > =  values in this interval not displayed.    HG/HCT  stable,       Component Value Date/Time   HGB 9.7 (L) 08/06/2024 1545   HCT 31.5 (L) 08/06/2024 1545   MCV 89.2 08/06/2024 1545    Recent Labs  Lab 08/03/24 0937  LIPASE 14   No results for input(s): AMMONIA in the last 168 hours.    _______________________________________________ Hospitalist was called for admission for   Acute hypoxic respiratory failure  Acute pulmonary embolism with acute cor pulmonale, unspecified pulmonary embolism type     The following Work up has been ordered so far:  Orders Placed This Encounter  Procedures   Resp panel by RT-PCR (RSV, Flu A&B, Covid) Anterior Nasal Swab   CT Angio Chest PE W and/or Wo Contrast   CBC with Differential   Basic metabolic panel   Pro Brain natriuretic peptide   Blood gas, venous (at [facility])   ED Cardiac monitoring   Initiate Carrier Fluid Protocol   Consult to hospitalist   heparin  per pharmacy consult   Pulse oximetry, continuous   Oxygen therapy Mode or (Route): Nasal cannula   I-stat chem 8, ed   ED EKG     OTHER Significant initial  Findings:  labs showing:     DM  labs:  HbA1C: No results for input(s): HGBA1C in the last 8760 hours.     CBG (last 3)  No results for input(s): GLUCAP in the last 72 hours.        Cultures: No results found for: SDES, SPECREQUEST, CULT, REPTSTATUS   Radiological Exams on Admission: CT Angio Chest  PE W and/or Wo Contrast Result Date: 08/06/2024 CLINICAL DATA:  Pulmonary embolism (PE) suspected, high prob EXAM: CT ANGIOGRAPHY CHEST WITH CONTRAST TECHNIQUE: Multidetector CT imaging of the chest was performed using the standard protocol during bolus administration of intravenous contrast. Multiplanar CT image reconstructions and MIPs were obtained to evaluate the vascular anatomy. RADIATION DOSE REDUCTION: This exam was performed according to the departmental dose-optimization program which includes automated exposure control, adjustment of the mA and/or kV according to patient size and/or use of iterative reconstruction technique. CONTRAST:  75mL OMNIPAQUE  IOHEXOL  350 MG/ML SOLN COMPARISON:  08/03/2024 FINDINGS: Cardiovascular: Pulmonary emboli noted in the right upper lobe, pulmonary emboli noted in all lobes of the right lung. No emboli seen on the left. Evidence of right heart strain with RV to LV ratio of 1.15. No evidence of aortic aneurysm. Coronary artery and aortic atherosclerosis. Mediastinum/Nodes: No mediastinal, hilar, or axillary adenopathy. Trachea and esophagus are unremarkable. Thyroid unremarkable. Very large hiatal hernia containing much of the stomach and transverse colon. Lungs/Pleura: Large bilateral pleural effusions. Compressive atelectasis in the lower lobes bilaterally. Upper Abdomen: No acute findings Musculoskeletal: Chest wall soft tissues are unremarkable. No acute bony abnormality. Review of the MIP images confirms the above findings. IMPRESSION: Pulmonary emboli within all lobes of the right lung. CT evidence of right heart strain (RV/LV Ratio = 1.15) consistent with at least submassive (intermediate risk) PE. The presence of right heart strain has been associated with an increased risk of morbidity and mortality. Please refer to the Code PE Focused order set in EPIC. Large hiatal hernia containing most of the stomach and transverse colon. Large bilateral pleural effusions.  Compressive atelectasis in the lower lobes. Aortic Atherosclerosis (ICD10-I70.0). These results were called by telephone at the time of interpretation on 08/06/2024 at 7:32 pm to provider HAYLEY NAASZ , who verbally acknowledged these results.  Electronically Signed   By: Franky Crease M.D.   On: 08/06/2024 19:36   _______________________________________________________________________________________________________ Latest  Blood pressure 127/83, pulse 94, temperature (!) 97.4 F (36.3 C), temperature source Oral, resp. rate 20, SpO2 95%.   Vitals  labs and radiology finding personally reviewed  Review of Systems:    Pertinent positives include:  shortness of breath at rest.  Constitutional:  No weight loss, night sweats, Fevers, chills, fatigue, weight loss  HEENT:  No headaches, Difficulty swallowing,Tooth/dental problems,Sore throat,  No sneezing, itching, ear ache, nasal congestion, post nasal drip,  Cardio-vascular:  No chest pain, Orthopnea, PND, anasarca, dizziness, palpitations.no Bilateral lower extremity swelling  GI:  No heartburn, indigestion, abdominal pain, nausea, vomiting, diarrhea, change in bowel habits, loss of appetite, melena, blood in stool, hematemesis Resp:  no  No dyspnea on exertion, No excess mucus, no productive cough, No non-productive cough, No coughing up of blood.No change in color of mucus.No wheezing. Skin:  no rash or lesions. No jaundice GU:  no dysuria, change in color of urine, no urgency or frequency. No straining to urinate.  No flank pain.  Musculoskeletal:  No joint pain or no joint swelling. No decreased range of motion. No back pain.  Psych:  No change in mood or affect. No depression or anxiety. No memory loss.  Neuro: no localizing neurological complaints, no tingling, no weakness, no double vision, no gait abnormality, no slurred speech, no confusion  All systems reviewed and apart from HOPI all are  negative _______________________________________________________________________________________________ Past Medical History:   Past Medical History:  Diagnosis Date   AKI (acute kidney injury) 04/30/2018   Community acquired pneumonia 04/30/2018   Dementia (HCC)    Hyperlipidemia    Hypertension    Leukocytosis 04/30/2018      Past Surgical History:  Procedure Laterality Date   ABDOMINAL HYSTERECTOMY     TOTAL HIP ARTHROPLASTY Left 08/03/2024   Procedure: ARTHROPLASTY, HIP, TOTAL, ANTERIOR APPROACH;  Surgeon: Jerri Kay HERO, MD;  Location: [facility];  Service: Orthopedics;  Laterality: Left;    Social History:      reports that she has never smoked. She has never used smokeless tobacco. She reports that she does not drink alcohol  and does not use drugs.     Family History:   Family History  Problem Relation Age of Onset   Heart failure Father    ______________________________________________________________________________________________ Allergies: No Known Allergies   Prior to Admission medications   Medication Sig Start Date End Date Taking? Authorizing Provider  acetaminophen  (TYLENOL ) 325 MG tablet Take 650 mg by mouth every 6 (six) hours as needed for moderate pain (pain score 4-6) or mild pain (pain score 1-3).    [provider]  aspirin  EC 325 MG tablet Take 1 tablet (325 mg total) by mouth in the morning and at bedtime for 28 days. 08/03/24 08/31/24  Jerri Kay HERO, MD  cetirizine (ZYRTEC) 10 MG tablet Take 10 mg by mouth daily.    [provider]  cyanocobalamin  1000 MCG tablet Take 1 tablet (1,000 mcg total) by mouth daily. 08/06/24   Krishnan, Gokul, MD  docusate sodium  (COLACE) 100 MG capsule Take 1 capsule (100 mg total) by mouth 2 (two) times daily. 08/06/24   Krishnan, Gokul, MD  feeding supplement (ENSURE PLUS HIGH PROTEIN) LIQD Take 237 mLs by mouth 2 (two) times daily between meals. 08/06/24   Krishnan, Gokul, MD  [facility] ALLERGY RELIEF 50  MCG/ACT nasal spray Place 1 spray into both nostrils daily.  03/18/23   [provider]  HYDROcodone -acetaminophen  (NORCO/VICODIN) 5-325 MG tablet Take 1 tablet by mouth every 6 (six) hours as needed for moderate pain (pain score 4-6). 08/03/24   Jerri Kay HERO, MD  meclizine  (ANTIVERT ) 25 MG tablet Take 25 mg by mouth every 8 (eight) hours as needed (Vertigo).    [provider]  memantine  (NAMENDA ) 10 MG tablet Take 1 tablet (10 mg total) by mouth 2 (two) times daily. Patient taking differently: Take 10 mg by mouth at bedtime. 05/09/22   Whitfield Raisin, NP  methocarbamol  (ROBAXIN ) 500 MG tablet Take 1 tablet (500 mg total) by mouth every 8 (eight) hours as needed for muscle spasms. 08/06/24   Krishnan, Gokul, MD  mirtazapine  (REMERON ) 15 MG tablet Take 15 mg by mouth at bedtime.    [provider]  pantoprazole  (PROTONIX ) 20 MG tablet Take 20 mg by mouth daily. 02/04/18   [provider]  polyethylene glycol (MIRALAX  / GLYCOLAX ) 17 g packet Take 17 g by mouth daily as needed for mild constipation. 08/06/24   Krishnan, Gokul, MD  sertraline  (ZOLOFT ) 50 MG tablet Take 50 mg by mouth daily. 04/24/23   [provider]  VITAMIN D PO Take 50 mcg by mouth daily.    [provider]    ___________________________________________________________________________________________________ Physical Exam:    08/06/2024    7:30 PM 08/06/2024    6:41 PM 08/06/2024    3:17 PM  Vitals with BMI  Systolic 127 124 876  Diastolic 83 73 75  Pulse 94 53 101     1. General:  in No  Acute distress   Chronically ill   -appearing 2. Psychological: Alert and   Oriented to self not time 3. Head/ENT:    Dry Mucous Membranes                          Head Non traumatic, neck supple                         Poor Dentition 4. SKIN:  decreased Skin turgor,  Skin clean Dry and intact no rash    5. Heart: Regular rate and rhythm no  Murmur, no Rub or gallop 6. Lungs:   no  wheezes or crackles   7. Abdomen: Soft,  non-tender, Non distended  bowel sounds present 8. Lower extremities: no clubbing, cyanosis,  edema L > r 9. Neurologically Grossly intact, moving all 4 extremities equally   10. MSK: Normal range of motion    Chart has been reviewed  ______________________________________________________________________________________________  Assessment/Plan  89 y.o. female with medical history significant of dementia GERD recent hip replacement  Admitted for   Acute hypoxic respiratory failure    Acute pulmonary embolism with acute cor pulmonale, unspecified pulmonary embolism type     Present on Admission:  Essential hypertension  Stage 3 chronic kidney disease (HCC)  Acute pulmonary embolism (HCC)  Pulmonary embolism (HCC)  Gastro-esophageal reflux disease without esophagitis  Moderate dementia (HCC)  Anemia  Acute respiratory failure with hypoxia (HCC)     Essential hypertension Allow permissive htn   Stage 3 chronic kidney disease (HCC)  -chronic avoid nephrotoxic medications such as NSAIDs, Vanco Zosyn combo,  avoid hypotension, continue to follow renal function   Acute pulmonary embolism (HCC)  Admit to  stepdown Initiate heparin  drip  Would likely benefit from case manager consult for long term anticoagulation Hold home blood pressure medications  avoid hypotension Cycle cardiac enzymes Order echogram and lower extremity Dopplers  Most likely risk factors for hypercoagulable state being recent surgery   Given sub-massive PE [facility] M consulted and code PE activated, per PCCM patient not a candidate for tPA okay to admit to stepdown reconsult critical care if patient decompensates   Gastro-esophageal reflux disease without esophagitis Continue PPI  Moderate dementia (HCC) Continue Namenda  monitor for any sign of sundowning  Anemia Obtain anemia panel  Transfuse for Hg <7 , rapidly dropping or  if symptomatic   Acute respiratory  failure with hypoxia (HCC)  this patient has acute respiratory failure with Hypoxia   as documented by the presence of following: O2 saturatio< 90% on RA   Likely due to:PE Provide O2 therapy and titrate as needed  Continuous pulse ox     Other plan as per orders.  DVT prophylaxis:  heparin     Code Status: DNR/DNI paperwork at bedside   Family Communication:   Family not at  Bedside    Diet heart healthy   Disposition Plan:                              Back to current facility when stable                              Following barriers for discharge:                               Vital stabilized able to transition to p.o. anticoagulation    Consult Orders  (From admission, onward)           Start     Ordered   08/06/24 1938  Consult to hospitalist  Once       Provider:  (Not yet assigned)  Question Answer Comment  Place call to: [facility]   Reason for Consult Admit      08/06/24 1938                              Transition of care consulted                                       Consults called: PCCM is aware   Admission status:  ED Disposition     ED Disposition  Admit   Condition  --   Comment  Hospital Area: [facility] [[id number]]  Level of Care: Stepdown [14]  Admit to SDU based on following criteria: Hemodynamic compromise or significant risk of instability:  Patient requiring short term acute titration and management of vasoactive drips, and invasive monitoring (i.e., CVP and Arterial line).  May place patient in observation at [facility] or [facility] if equivalent level of care is available:: No  Covid Evaluation: Asymptomatic - no recent exposure (last 10 days) testing not required  Diagnosis: Pulmonary embolism (HCC) [241700]  Admitting Physician: Yanelli Zapanta [[id number]]  Attending Physician: Margerite Impastato [[id number]]  For patients discharging to extended facilities (i.e. SNF, AL, group homes or LTAC)  initiate:: Discharge to SNF/Facility Placement COVID-19 Lab Testing Protocol           Obs      Level of  care      stepdown    Ronith Berti 08/06/2024, 9:09 PM    [facility]     after 2 AM please page floor coverage   If 7AM-7PM, please contact the day team taking care of the patient using [email or web address]

## 2024-08-06 NOTE — ED Provider Notes (Signed)
 [facility] AT [facility] Provider Note   CSN: [id number] Arrival date & time: 08/06/24  1454     History  Chief Complaint  Patient presents with   Shortness of Breath    Mandy Adams is a 89 y.o. female with AKI, CAP, dementia, hyperlipidemia, hypertension, leukocytosis, GERD, eczema, aortic atherosclerosis, stage IIIa CKD who was BIBA from [place] [facility] for sudden onset shortness of breath.  Per EMS pt was discharged from hospital 2 hours ago, developed shortness of breath with cyanosis and hypoxia.  Denies chest pain.  She endorses some mild leg swelling but denies fever/chills or flulike symptoms.  She endorses mild cough productive of clear sputum.  No hemoptysis.   Past Medical History:  Diagnosis Date   AKI (acute kidney injury) 04/30/2018   Community acquired pneumonia 04/30/2018   Dementia (HCC)    Hyperlipidemia    Hypertension    Leukocytosis 04/30/2018       Home Medications Prior to Admission medications   Medication Sig Start Date End Date Taking? Authorizing Provider  acetaminophen  (TYLENOL ) 325 MG tablet Take 650 mg by mouth every 6 (six) hours as needed for moderate pain (pain score 4-6) or mild pain (pain score 1-3).   Yes [provider]  apixaban  (ELIQUIS ) 5 MG TABS tablet Take 1 tablet (5 mg total) by mouth 2 (two) times daily. Please start only after you have completed the starter pack. 09/09/24  Yes Verdene Purchase, MD  APIXABAN  (ELIQUIS ) VTE STARTER PACK (10MG  AND 5MG ) Take as directed on package: start with two-5mg  tablets twice daily for 7 days. On day 8, switch to one-5mg  tablet twice daily. 08/10/24  Yes Krishnan, Gokul, MD  cetirizine (ZYRTEC) 10 MG tablet Take 10 mg by mouth daily.   Yes [provider]  FLONASE ALLERGY RELIEF 50 MCG/ACT nasal spray Place 1 spray into both nostrils daily. 03/18/23  Yes [provider]  meclizine  (ANTIVERT ) 25 MG tablet Take 25 mg by mouth every 8 (eight)  hours as needed (Vertigo).   Yes [provider]  memantine  (NAMENDA ) 10 MG tablet Take 1 tablet (10 mg total) by mouth 2 (two) times daily. Patient taking differently: Take 10 mg by mouth at bedtime. 05/09/22  Yes McCue, Harlene, NP  mineral oil-hydrophilic petrolatum (AQUAPHOR) ointment Apply 1 Application topically in the morning and at bedtime.   Yes [provider]  mirtazapine  (REMERON ) 15 MG tablet Take 15 mg by mouth at bedtime.   Yes [provider]  morphine  (ROXANOL) 20 MG/ML concentrated solution Take 0.25 mLs (5 mg total) by mouth every 2 (two) hours as needed for severe pain (pain score 7-10). 08/10/24  Yes Krishnan, Gokul, MD  pantoprazole  (PROTONIX ) 20 MG tablet Take 20 mg by mouth daily. 02/04/18  Yes [provider]  sertraline  (ZOLOFT ) 50 MG tablet Take 50 mg by mouth daily. 04/24/23  Yes [provider]  VITAMIN D PO Take 50 mcg by mouth daily.   Yes [provider]  aspirin  EC 325 MG tablet Take 1 tablet (325 mg total) by mouth in the morning and at bedtime for 28 days. Patient not taking: Reported on 08/07/2024 08/03/24 08/31/24  Jerri Kay HERO, MD  cyanocobalamin  1000 MCG tablet Take 1 tablet (1,000 mcg total) by mouth daily. Patient not taking: Reported on 08/07/2024 08/06/24   Krishnan, Gokul, MD  docusate sodium  (COLACE) 100 MG capsule Take 1 capsule (100 mg total) by mouth 2 (two) times daily. Patient not taking: Reported  on 08/07/2024 08/06/24   Krishnan, Gokul, MD  feeding supplement (ENSURE PLUS HIGH PROTEIN) LIQD Take 237 mLs by mouth 2 (two) times daily between meals. Patient not taking: Reported on 08/07/2024 08/06/24   Krishnan, Gokul, MD  glycopyrrolate (ROBINUL) 1 MG tablet Take 1 tablet (1 mg total) by mouth every 4 (four) hours as needed (excessive secretions). 08/10/24   Krishnan, Gokul, MD  haloperidol  (HALDOL ) 1 MG tablet Take 0.5 tablets (0.5 mg total) by mouth every 4 (four) hours as needed for agitation (or  delirium). 08/10/24   Krishnan, Gokul, MD  HYDROcodone -acetaminophen  (NORCO/VICODIN) 5-325 MG tablet Take 1 tablet by mouth every 6 (six) hours as needed for moderate pain (pain score 4-6). Patient not taking: Reported on 08/07/2024 08/03/24   Jerri Kay HERO, MD  methocarbamol  (ROBAXIN ) 500 MG tablet Take 1 tablet (500 mg total) by mouth every 8 (eight) hours as needed for muscle spasms. Patient not taking: Reported on 08/07/2024 08/06/24   Krishnan, Gokul, MD  polyethylene glycol (MIRALAX  / GLYCOLAX ) 17 g packet Take 17 g by mouth daily as needed for mild constipation. Patient not taking: Reported on 08/07/2024 08/06/24   Krishnan, Gokul, MD      Allergies    Patient has no known allergies.    Review of Systems   Review of Systems A 10 point review of systems was performed and is negative unless otherwise reported in HPI.  Physical Exam Updated Vital Signs BP 121/81 (BP Location: Right Arm)   Pulse (!) 101   Temp 97.7 F (36.5 C)   Resp 16   Ht 5' (1.524 m)   Wt 55.1 kg   SpO2 99%   BMI 23.72 kg/m  Physical Exam General: Normal appearing elderly female, lying in bed.  HEENT: PERRLA, Sclera anicteric, MMM, trachea midline.  Cardiology: Mildly regular tachycardic rate, no murmurs/rubs/gallops.  Resp: No repsiratory distress. Mild tachypnea. CTAB, no wheezes, rhonchi, crackles.  Abd: Soft, non-tender, non-distended. No rebound tenderness or guarding.  GU: Deferred. MSK: No peripheral edema or signs of trauma.  Skin: warm, dry. Neuro: A&Ox2-3, CNs II-XII grossly intact. MAEs. Sensation grossly intact.  Psych: Normal mood and affect.   ED Results / Procedures / Treatments   Labs (all labs ordered are listed, but only abnormal results are displayed) Labs Reviewed  CBC WITH DIFFERENTIAL/PLATELET - Abnormal; Notable for the following components:   WBC 11.6 (*)    RBC 3.53 (*)    Hemoglobin 9.7 (*)    HCT 31.5 (*)    RDW 17.2 (*)    Neutro Abs 8.9 (*)    Abs Immature Granulocytes  0.21 (*)    All other components within normal limits  BASIC METABOLIC PANEL WITH GFR - Abnormal; Notable for the following components:   Chloride 113 (*)    CO2 18 (*)    Glucose, Bld 124 (*)    BUN 34 (*)    Creatinine, Ser 1.24 (*)    Calcium  8.3 (*)    GFR, Estimated 41 (*)    All other components within normal limits  PRO BRAIN NATRIURETIC PEPTIDE - Abnormal; Notable for the following components:   Pro Brain Natriuretic Peptide 22,337.0 (*)    All other components within normal limits  BLOOD GAS, VENOUS - Abnormal; Notable for the following components:   pO2, Ven <31 (*)    Bicarbonate 19.7 (*)    Acid-base deficit 7.4 (*)    All other components within normal limits  I-STAT CHEM 8, ED - Abnormal;  Notable for the following components:   Chloride 113 (*)    BUN 32 (*)    Creatinine, Ser 1.30 (*)    Glucose, Bld 121 (*)    TCO2 18 (*)    Hemoglobin 9.9 (*)    HCT 29.0 (*)    All other components within normal limits  TROPONIN T, HIGH SENSITIVITY - Abnormal; Notable for the following components:   Troponin T High Sensitivity 69 (*)    All other components within normal limits  TROPONIN T, HIGH SENSITIVITY - Abnormal; Notable for the following components:   Troponin T High Sensitivity 67 (*)    All other components within normal limits  LACTIC ACID, PLASMA  LACTIC ACID, PLASMA  MAGNESIUM   PHOSPHORUS  MAGNESIUM   I-STAT CG4 LACTIC ACID, ED    Radiology CT PE: IMPRESSION: Pulmonary emboli within all lobes of the right lung. CT evidence of right heart strain (RV/LV Ratio = 1.15) consistent with at least submassive (intermediate risk) PE. The presence of right heart strain has been associated with an increased risk of morbidity and mortality. Please refer to the Code PE Focused order set in EPIC. Large hiatal hernia containing most of the stomach and transverse colon. Large bilateral pleural effusions. Compressive atelectasis in the lower lobes. Aortic  Atherosclerosis (ICD10-I70.0). These results were called by telephone at the time of interpretation on 08/06/2024 at 7:32 pm to provider Angas Isabell , who verbally acknowledged these results.    Procedures .Critical Care  Performed by: Franklyn Sid SAILOR, MD Authorized by: Franklyn Sid SAILOR, MD   Critical care provider statement:    Critical care time (minutes):  30   Critical care was time spent personally by me on the following activities:  Development of treatment plan with patient or surrogate, discussions with consultants, evaluation of patient's response to treatment, examination of patient, ordering and review of laboratory studies, ordering and review of radiographic studies, ordering and performing treatments and interventions, pulse oximetry, re-evaluation of patient's condition, review of old charts and obtaining history from patient or surrogate   Care discussed with: admitting provider       Medications Ordered in ED Medications  iohexol  (OMNIPAQUE ) 350 MG/ML injection 75 mL (75 mLs Intravenous Contrast Given 08/06/24 1913)  oxyCODONE  (Oxy IR/ROXICODONE ) immediate release tablet 5 mg (5 mg Oral Given 08/06/24 2003)  furosemide  (LASIX ) injection 20 mg (20 mg Intravenous Given 08/06/24 2004)  heparin  bolus via infusion 3,000 Units (3,000 Units Intravenous Bolus from Bag 08/06/24 2011)    ED Course/ Medical Decision Making/ A&P                          Medical Decision Making Amount and/or Complexity of Data Reviewed Labs: ordered. Decision-making details documented in ED Course. Radiology: ordered.  Risk Prescription drug management. Decision regarding hospitalization.    This patient presents to the ED for concern of SOB/hypoxia, this involves an extensive number of treatment options, and is a complaint that carries with it a high risk of complications and morbidity.  I considered the following differential and admission for this acute, potentially life threatening  condition.   MDM:    DDX for dyspnea includes but is not limited to:  Greatest concern for PE w/ new SOB, tachypnea, and tachycardia as well as hypoxic resp failure after hospitalization. Also consider heart failure exacerbation or PNA. pt w/ no chest pain. Trop mildly elevated but flat. Pt w/ very elevated pro-BNP. CT PE shows R  sided PE w/ right heart strain. Also shows BL pleural effusions, possibly from acute HF in s/o PEs. No prior echocardiogram in system.  Patient does have documented DNR at bedside.    Clinical Course as of 08/10/24 1630  Thu Aug 06, 2024  1648 Basic metabolic panel(!) Similar to yesterday [HN]  1648 Lactic Acid, Venous: 1.8 neg [HN]  1648 Pro Brain Natriuretic Peptide(!): 22,337.0 Very elevated [HN]  1935 Cardiology patient has PEs throughout her right lung as well as right heart strain. SPESI is high. Will start heparin  as well as IV lasix  given elevated BNP and BL pleural effusions. She is mildly dyspneic and requiring 5L [place] but is not in resp distress, do not believe she requires emergent thoracentesis. Consult for admission to medicine. [HN]    Clinical Course User Index [HN] Franklyn Sid SAILOR, MD    Labs: I Ordered, and personally interpreted labs.  The pertinent results include:  those listed above  Imaging Studies ordered: I ordered imaging studies including CT PE I independently visualized and interpreted imaging. I agree with the radiologist interpretation  Additional history obtained from chart review.    Cardiac Monitoring: The patient was maintained on a cardiac monitor.  I personally viewed and interpreted the cardiac monitored which showed an underlying rhythm of: sinus tachycardia  Reevaluation: After the interventions noted above, I reevaluated the patient and found that they have :stayed the same  Social Determinants of Health: Was DC'd to SNF yesterday  Disposition:  Admit to hospitalist on stepdown  Co morbidities that complicate  the patient evaluation  Past Medical History:  Diagnosis Date   AKI (acute kidney injury) 04/30/2018   Community acquired pneumonia 04/30/2018   Dementia (HCC)    Hyperlipidemia    Hypertension    Leukocytosis 04/30/2018     Medicines Meds ordered this encounter  Medications   iohexol  (OMNIPAQUE ) 350 MG/ML injection 75 mL   oxyCODONE  (Oxy IR/ROXICODONE ) immediate release tablet 5 mg    Refill:  0   furosemide  (LASIX ) injection 20 mg   heparin  bolus via infusion 3,000 Units   DISCONTD: heparin  ADULT infusion 100 units/mL (25000 units/250mL)   memantine  (NAMENDA ) tablet 10 mg   mirtazapine  (REMERON ) tablet 15 mg   sertraline  (ZOLOFT ) tablet 50 mg   meclizine  (ANTIVERT ) tablet 25 mg   pantoprazole  (PROTONIX ) EC tablet 20 mg   polyethylene glycol (MIRALAX  / GLYCOLAX ) packet 17 g   DISCONTD: 0.9 %  sodium chloride  infusion   OR Linked Order Group    acetaminophen  (TYLENOL ) tablet 650 mg    acetaminophen  (TYLENOL ) suppository 650 mg   DISCONTD: HYDROcodone -acetaminophen  (NORCO/VICODIN) 5-325 MG per tablet 1-2 tablet   OR Linked Order Group    ondansetron  (ZOFRAN ) tablet 4 mg    ondansetron  (ZOFRAN ) injection 4 mg   DISCONTD: Chlorhexidine  Gluconate Cloth 2 % PADS 6 each   DISCONTD: Oral care mouth rinse   DISCONTD: Oral care mouth rinse   potassium PHOSPHATE  15 mmol in dextrose  5 % 250 mL infusion   DISCONTD: 0.9 %  sodium chloride  infusion   sodium chloride  flush (NS) 0.9 % injection 10-40 mL   haloperidol  lactate (HALDOL ) injection 1 mg   Oral care mouth rinse   mupirocin  ointment (BACTROBAN ) 2 % 1 Application   DISCONTD: Chlorhexidine  Gluconate Cloth 2 % PADS 6 each   FOLLOWED BY Linked Order Group    apixaban  (ELIQUIS ) tablet 10 mg    apixaban  (ELIQUIS ) tablet 5 mg   furosemide  (LASIX ) injection 20 mg  OR Linked Order Group    haloperidol  (HALDOL ) tablet 0.5 mg    haloperidol  (HALDOL ) 2 MG/ML solution 0.5 mg    haloperidol  lactate (HALDOL ) injection 0.5 mg   OR  Linked Order Group    glycopyrrolate (ROBINUL) tablet 1 mg    glycopyrrolate (ROBINUL) injection 0.2 mg    glycopyrrolate (ROBINUL) injection 0.2 mg   antiseptic oral rinse (BIOTENE) solution 15 mL   artificial tears ophthalmic solution 1 drop   OR Linked Order Group    oxyCODONE  (ROXICODONE  INTENSOL) 20 MG/ML concentrated solution 5 mg     Refill:  0    oxyCODONE  (ROXICODONE  INTENSOL) 20 MG/ML concentrated solution 5 mg     Refill:  0   DISCONTD: HYDROmorphone  (DILAUDID ) injection 0.5 mg   HYDROmorphone  (DILAUDID ) injection 0.5 mg   DISCONTD: oxyCODONE  (ROXICODONE  INTENSOL) 20 MG/ML concentrated solution    Sig: Place 0.3 mLs (6 mg total) under the tongue every 2 (two) hours as needed for moderate pain (pain score 4-6) (or dyspnea).    Dispense:  30 mL    Refill:  0   APIXABAN  (ELIQUIS ) VTE STARTER PACK (10MG  AND 5MG )    Sig: Take as directed on package: start with two-5mg  tablets twice daily for 7 days. On day 8, switch to one-5mg  tablet twice daily.    Dispense:  74 each    Refill:  0    If starter pack unavailable, substitute with seventy-four 5 mg apixaban  tabs following the above SIG directions.   apixaban  (ELIQUIS ) 5 MG TABS tablet    Sig: Take 1 tablet (5 mg total) by mouth 2 (two) times daily. Please start only after you have completed the starter pack.    Dispense:  60 tablet    Refill:  1   haloperidol  (HALDOL ) 1 MG tablet    Sig: Take 0.5 tablets (0.5 mg total) by mouth every 4 (four) hours as needed for agitation (or delirium).    Dispense:  15 tablet    Refill:  0   glycopyrrolate (ROBINUL) 1 MG tablet    Sig: Take 1 tablet (1 mg total) by mouth every 4 (four) hours as needed (excessive secretions).    Dispense:  30 tablet    Refill:  0   morphine  (ROXANOL) 20 MG/ML concentrated solution    Sig: Take 0.25 mLs (5 mg total) by mouth every 2 (two) hours as needed for severe pain (pain score 7-10).    Dispense:  30 mL    Refill:  0    I have reviewed the patients home  medicines and have made adjustments as needed  Problem List / ED Course: Problem List Items Addressed This Visit       Cardiovascular and Mediastinum   Acute pulmonary embolism (HCC)    Admit to  stepdown Initiate heparin  drip  Would likely benefit from case manager consult for long term anticoagulation Hold home blood pressure medications avoid hypotension Cycle cardiac enzymes Order echogram and lower extremity Dopplers  Most likely risk factors for hypercoagulable state being recent surgery   Given sub-massive PE [facility] M consulted and code PE activated, per PCCM patient not a candidate for tPA okay to admit to stepdown reconsult critical care if patient decompensates       Relevant Medications   apixaban  (ELIQUIS ) tablet 10 mg   apixaban  (ELIQUIS ) tablet 5 mg (Start on 08/14/2024 10:00 AM)   furosemide  (LASIX ) injection 20 mg   APIXABAN  (ELIQUIS ) VTE STARTER PACK (10MG  AND 5MG )  apixaban  (ELIQUIS ) 5 MG TABS tablet (Start on 09/09/2024)     Respiratory   Acute hypoxic respiratory failure (HCC) - Primary                This note was created using dictation software, which may contain spelling or grammatical errors.    Franklyn Sid SAILOR, MD 08/10/24 [phone]

## 2024-08-06 NOTE — ED Notes (Signed)
 Pt requesting med for surgical pain, left hip,  EDP notified, no additional orders at this time

## 2024-08-07 ENCOUNTER — Encounter (HOSPITAL_COMMUNITY): Payer: Self-pay | Admitting: Internal Medicine

## 2024-08-07 ENCOUNTER — Observation Stay (HOSPITAL_COMMUNITY)

## 2024-08-07 ENCOUNTER — Observation Stay (HOSPITAL_BASED_OUTPATIENT_CLINIC_OR_DEPARTMENT_OTHER)

## 2024-08-07 ENCOUNTER — Telehealth (HOSPITAL_COMMUNITY): Payer: Self-pay | Admitting: Pharmacy Technician

## 2024-08-07 ENCOUNTER — Other Ambulatory Visit: Payer: Self-pay

## 2024-08-07 ENCOUNTER — Other Ambulatory Visit (HOSPITAL_COMMUNITY): Payer: Self-pay

## 2024-08-07 DIAGNOSIS — Z66 Do not resuscitate: Secondary | ICD-10-CM | POA: Diagnosis present

## 2024-08-07 DIAGNOSIS — Z7189 Other specified counseling: Secondary | ICD-10-CM | POA: Diagnosis not present

## 2024-08-07 DIAGNOSIS — I2699 Other pulmonary embolism without acute cor pulmonale: Secondary | ICD-10-CM

## 2024-08-07 DIAGNOSIS — I700 Atherosclerosis of aorta: Secondary | ICD-10-CM | POA: Diagnosis present

## 2024-08-07 DIAGNOSIS — Z96642 Presence of left artificial hip joint: Secondary | ICD-10-CM | POA: Diagnosis present

## 2024-08-07 DIAGNOSIS — F05 Delirium due to known physiological condition: Secondary | ICD-10-CM | POA: Diagnosis present

## 2024-08-07 DIAGNOSIS — I2609 Other pulmonary embolism with acute cor pulmonale: Principal | ICD-10-CM

## 2024-08-07 DIAGNOSIS — J9601 Acute respiratory failure with hypoxia: Secondary | ICD-10-CM | POA: Diagnosis present

## 2024-08-07 DIAGNOSIS — E785 Hyperlipidemia, unspecified: Secondary | ICD-10-CM | POA: Diagnosis present

## 2024-08-07 DIAGNOSIS — F03B4 Unspecified dementia, moderate, with anxiety: Secondary | ICD-10-CM | POA: Diagnosis present

## 2024-08-07 DIAGNOSIS — F0390 Unspecified dementia without behavioral disturbance: Secondary | ICD-10-CM | POA: Diagnosis not present

## 2024-08-07 DIAGNOSIS — Z79899 Other long term (current) drug therapy: Secondary | ICD-10-CM | POA: Diagnosis not present

## 2024-08-07 DIAGNOSIS — D631 Anemia in chronic kidney disease: Secondary | ICD-10-CM | POA: Diagnosis present

## 2024-08-07 DIAGNOSIS — Z515 Encounter for palliative care: Secondary | ICD-10-CM | POA: Diagnosis not present

## 2024-08-07 DIAGNOSIS — J9811 Atelectasis: Secondary | ICD-10-CM | POA: Diagnosis present

## 2024-08-07 DIAGNOSIS — E860 Dehydration: Secondary | ICD-10-CM | POA: Diagnosis present

## 2024-08-07 DIAGNOSIS — K219 Gastro-esophageal reflux disease without esophagitis: Secondary | ICD-10-CM | POA: Diagnosis present

## 2024-08-07 DIAGNOSIS — M4856XA Collapsed vertebra, not elsewhere classified, lumbar region, initial encounter for fracture: Secondary | ICD-10-CM | POA: Diagnosis present

## 2024-08-07 DIAGNOSIS — Z7901 Long term (current) use of anticoagulants: Secondary | ICD-10-CM | POA: Diagnosis not present

## 2024-08-07 DIAGNOSIS — Z1152 Encounter for screening for COVID-19: Secondary | ICD-10-CM | POA: Diagnosis not present

## 2024-08-07 DIAGNOSIS — Z8249 Family history of ischemic heart disease and other diseases of the circulatory system: Secondary | ICD-10-CM | POA: Diagnosis not present

## 2024-08-07 DIAGNOSIS — D649 Anemia, unspecified: Secondary | ICD-10-CM | POA: Diagnosis not present

## 2024-08-07 DIAGNOSIS — N1831 Chronic kidney disease, stage 3a: Secondary | ICD-10-CM

## 2024-08-07 DIAGNOSIS — I1310 Hypertensive heart and chronic kidney disease without heart failure, with stage 1 through stage 4 chronic kidney disease, or unspecified chronic kidney disease: Secondary | ICD-10-CM | POA: Diagnosis present

## 2024-08-07 DIAGNOSIS — E872 Acidosis, unspecified: Secondary | ICD-10-CM | POA: Diagnosis present

## 2024-08-07 DIAGNOSIS — L89312 Pressure ulcer of right buttock, stage 2: Secondary | ICD-10-CM | POA: Diagnosis present

## 2024-08-07 DIAGNOSIS — G9340 Encephalopathy, unspecified: Secondary | ICD-10-CM | POA: Diagnosis present

## 2024-08-07 DIAGNOSIS — J90 Pleural effusion, not elsewhere classified: Secondary | ICD-10-CM | POA: Diagnosis present

## 2024-08-07 DIAGNOSIS — R64 Cachexia: Secondary | ICD-10-CM | POA: Diagnosis present

## 2024-08-07 LAB — MAGNESIUM
Magnesium: 2.4 mg/dL (ref 1.7–2.4)
Magnesium: 2.3 mg/dL (ref 1.7–2.4)

## 2024-08-07 LAB — ECHOCARDIOGRAM COMPLETE
AR max vel: 2.51 cm2
AV Area VTI: 2.47 cm2
AV Area mean vel: 2.16 cm2
AV Mean grad: 3 mmHg
AV Peak grad: 5.3 mmHg
Ao pk vel: 1.15 m/s
Area-P 1/2: 5.42 cm2
Calc EF: 70.8 %
Height: 60 in
MV VTI: 1.74 cm2
S' Lateral: 2.4 cm
Single Plane A2C EF: 75.5 %
Single Plane A4C EF: 63 %
Weight: 1943.58 [oz_av]

## 2024-08-07 LAB — CBC
HCT: 29.9 % — ABNORMAL LOW (ref 36.0–46.0)
Hemoglobin: 9.1 g/dL — ABNORMAL LOW (ref 12.0–15.0)
MCH: 27.1 pg (ref 26.0–34.0)
MCHC: 30.4 g/dL (ref 30.0–36.0)
MCV: 89 fL (ref 80.0–100.0)
Platelets: 239 K/uL (ref 150–400)
RBC: 3.36 MIL/uL — ABNORMAL LOW (ref 3.87–5.11)
RDW: 17.2 % — ABNORMAL HIGH (ref 11.5–15.5)
WBC: 11.4 K/uL — ABNORMAL HIGH (ref 4.0–10.5)
nRBC: 0.3 % — ABNORMAL HIGH (ref 0.0–0.2)

## 2024-08-07 LAB — COMPREHENSIVE METABOLIC PANEL WITH GFR
ALT: 6 U/L (ref 0–44)
AST: 65 U/L — ABNORMAL HIGH (ref 15–41)
Albumin: 3.2 g/dL — ABNORMAL LOW (ref 3.5–5.0)
Alkaline Phosphatase: 73 U/L (ref 38–126)
Anion gap: 9 (ref 5–15)
BUN: 29 mg/dL — ABNORMAL HIGH (ref 8–23)
CO2: 20 mmol/L — ABNORMAL LOW (ref 22–32)
Calcium: 8.1 mg/dL — ABNORMAL LOW (ref 8.9–10.3)
Chloride: 113 mmol/L — ABNORMAL HIGH (ref 98–111)
Creatinine, Ser: 1.2 mg/dL — ABNORMAL HIGH (ref 0.44–1.00)
GFR, Estimated: 43 mL/min — ABNORMAL LOW (ref >60)
Glucose, Bld: 90 mg/dL (ref 70–99)
Potassium: 4.2 mmol/L (ref 3.5–5.1)
Sodium: 141 mmol/L (ref 135–145)
Total Bilirubin: 0.5 mg/dL (ref 0.0–1.2)
Total Protein: 5.5 g/dL — ABNORMAL LOW (ref 6.5–8.1)

## 2024-08-07 LAB — TROPONIN T, HIGH SENSITIVITY
Troponin T High Sensitivity: 67 ng/L — ABNORMAL HIGH (ref 0–19)
Troponin T High Sensitivity: 72 ng/L — ABNORMAL HIGH (ref 0–19)
Troponin T High Sensitivity: 65 ng/L — ABNORMAL HIGH (ref 0–19)

## 2024-08-07 LAB — PHOSPHORUS
Phosphorus: 1.9 mg/dL — ABNORMAL LOW (ref 2.5–4.6)
Phosphorus: 1.8 mg/dL — ABNORMAL LOW (ref 2.5–4.6)

## 2024-08-07 LAB — HEPARIN LEVEL (UNFRACTIONATED): Heparin Unfractionated: 0.58 [IU]/mL (ref 0.30–0.70)

## 2024-08-07 LAB — LACTIC ACID, PLASMA
Lactic Acid, Venous: 1.5 mmol/L (ref 0.5–1.9)
Lactic Acid, Venous: 1 mmol/L (ref 0.5–1.9)

## 2024-08-07 LAB — CK
Total CK: 882 U/L — ABNORMAL HIGH (ref 38–234)
Total CK: 932 U/L — ABNORMAL HIGH (ref 38–234)

## 2024-08-07 LAB — OSMOLALITY: Osmolality: 308 mosm/kg — ABNORMAL HIGH (ref 275–295)

## 2024-08-07 LAB — MRSA NEXT GEN BY PCR, NASAL: MRSA by PCR Next Gen: DETECTED — AB

## 2024-08-07 LAB — OSMOLALITY, URINE: Osmolality, Ur: 333 mosm/kg (ref 300–900)

## 2024-08-07 MED ORDER — CHLORHEXIDINE GLUCONATE CLOTH 2 % EX PADS
6.0000 | MEDICATED_PAD | Freq: Every day | CUTANEOUS | Status: DC
  Administered 2024-08-08: 6 via TOPICAL

## 2024-08-07 MED ORDER — HALOPERIDOL LACTATE 5 MG/ML IJ SOLN
1.0000 mg | Freq: Once | INTRAMUSCULAR | Status: AC
  Administered 2024-08-07: 1 mg via INTRAVENOUS
  Filled 2024-08-07: qty 1

## 2024-08-07 MED ORDER — APIXABAN 5 MG PO TABS: 5.0000 mg | ORAL_TABLET | Freq: Two times a day (BID) | ORAL | Status: DC

## 2024-08-07 MED ORDER — ORAL CARE MOUTH RINSE: 15.0000 mL | OROMUCOSAL | Status: DC | PRN

## 2024-08-07 MED ORDER — ACETAMINOPHEN 650 MG RE SUPP: 650.0000 mg | Freq: Four times a day (QID) | RECTAL | Status: DC | PRN

## 2024-08-07 MED ORDER — MEMANTINE HCL 10 MG PO TABS
10.0000 mg | ORAL_TABLET | Freq: Every day | ORAL | Status: DC
  Administered 2024-08-07 – 2024-08-10 (×5): 10 mg via ORAL
  Filled 2024-08-07 (×4): qty 1
  Filled 2024-08-07: qty 2

## 2024-08-07 MED ORDER — ONDANSETRON HCL 4 MG/2ML IJ SOLN
4.0000 mg | Freq: Four times a day (QID) | INTRAMUSCULAR | Status: DC | PRN
  Administered 2024-08-07: 4 mg via INTRAVENOUS
  Filled 2024-08-07: qty 2

## 2024-08-07 MED ORDER — MIRTAZAPINE 15 MG PO TABS
15.0000 mg | ORAL_TABLET | Freq: Every day | ORAL | Status: DC
Start: 2024-08-07 — End: 2024-08-11
  Administered 2024-08-07 – 2024-08-10 (×5): 15 mg via ORAL
  Filled 2024-08-07 (×5): qty 1

## 2024-08-07 MED ORDER — SODIUM CHLORIDE 0.9% FLUSH: 10.0000 mL | INTRAVENOUS | Status: DC | PRN

## 2024-08-07 MED ORDER — SODIUM CHLORIDE 0.9 % IV SOLN: INTRAVENOUS | Status: DC

## 2024-08-07 MED ORDER — ONDANSETRON HCL 4 MG PO TABS: 4.0000 mg | ORAL_TABLET | Freq: Four times a day (QID) | ORAL | Status: DC | PRN

## 2024-08-07 MED ORDER — ACETAMINOPHEN 325 MG PO TABS
650.0000 mg | ORAL_TABLET | Freq: Four times a day (QID) | ORAL | Status: DC | PRN
  Administered 2024-08-08 – 2024-08-10 (×3): 650 mg via ORAL
  Filled 2024-08-07 (×3): qty 2

## 2024-08-07 MED ORDER — POLYETHYLENE GLYCOL 3350 17 G PO PACK: 17.0000 g | PACK | Freq: Every day | ORAL | Status: DC | PRN

## 2024-08-07 MED ORDER — HYDROCODONE-ACETAMINOPHEN 5-325 MG PO TABS
1.0000 | ORAL_TABLET | ORAL | Status: DC | PRN
  Administered 2024-08-07 (×3): 1 via ORAL
  Filled 2024-08-07 (×3): qty 1

## 2024-08-07 MED ORDER — PANTOPRAZOLE SODIUM 20 MG PO TBEC
20.0000 mg | DELAYED_RELEASE_TABLET | Freq: Every day | ORAL | Status: DC
  Administered 2024-08-07 – 2024-08-10 (×4): 20 mg via ORAL
  Filled 2024-08-07 (×4): qty 1

## 2024-08-07 MED ORDER — MECLIZINE HCL 25 MG PO TABS: 25.0000 mg | ORAL_TABLET | Freq: Three times a day (TID) | ORAL | Status: DC | PRN

## 2024-08-07 MED ORDER — DEXTROSE 5 % IV SOLN
15.0000 mmol | Freq: Once | INTRAVENOUS | Status: AC
  Administered 2024-08-07: 15 mmol via INTRAVENOUS
  Filled 2024-08-07: qty 5

## 2024-08-07 MED ORDER — SERTRALINE HCL 50 MG PO TABS
50.0000 mg | ORAL_TABLET | Freq: Every day | ORAL | Status: DC
  Administered 2024-08-07 – 2024-08-10 (×4): 50 mg via ORAL
  Filled 2024-08-07 (×4): qty 1

## 2024-08-07 MED ORDER — APIXABAN 5 MG PO TABS
10.0000 mg | ORAL_TABLET | Freq: Two times a day (BID) | ORAL | Status: DC
  Administered 2024-08-07 – 2024-08-10 (×8): 10 mg via ORAL
  Filled 2024-08-07 (×8): qty 2

## 2024-08-07 MED ORDER — MUPIROCIN 2 % EX OINT
1.0000 | TOPICAL_OINTMENT | Freq: Two times a day (BID) | CUTANEOUS | Status: DC
  Administered 2024-08-07 – 2024-08-10 (×8): 1 via NASAL
  Filled 2024-08-07 (×4): qty 22

## 2024-08-07 NOTE — Telephone Encounter (Signed)
 Patient Product/process development scientist completed.    The patient is insured through [facility]. Patient has [employer], may use a copay card, and/or apply for patient assistance if available.    Ran test claim for Eliquis  5 ng and the current 30 day co-pay is $60.00.   This test claim was processed through [facility]- copay amounts may vary at other pharmacies due to pharmacy/plan contracts, or as the patient moves through the different stages of their insurance plan.     Reyes Sharps, CPHT Pharmacy Technician III Certified Patient Advocate [facility] Patient Advocate Team Direct Number: [phone]  Fax: [phone]

## 2024-08-07 NOTE — Plan of Care (Signed)
  Problem: Clinical Measurements: Goal: Respiratory complications will improve Outcome: Progressing   Problem: Elimination: Goal: Will not experience complications related to urinary retention Outcome: Progressing   Problem: Pain Managment: Goal: General experience of comfort will improve and/or be controlled Outcome: Progressing   Problem: Clinical Measurements: Goal: Respiratory complications will improve Outcome: Progressing   Problem: Elimination: Goal: Will not experience complications related to urinary retention Outcome: Progressing   Problem: Pain Managment: Goal: General experience of comfort will improve and/or be controlled Outcome: Progressing

## 2024-08-07 NOTE — Progress Notes (Addendum)
 PHARMACY - ANTICOAGULATION CONSULT NOTE  Pharmacy Consult for Heparin  Indication: pulmonary embolus  No Known Allergies  Patient Measurements: Height: 5' (152.4 cm) Weight: 55.1 kg (121 lb 7.6 oz) IBW/kg (Calculated) : 45.5 HEPARIN  DW (KG): 55.1  Vital Signs: Temp: 98 F (36.7 C) (09/26 0416) Temp Source: Oral (09/26 0416) BP: 94/40 (09/26 0600) Pulse Rate: 105 (09/26 0500)  Labs: Recent Labs    08/06/24 0332 08/06/24 1538 08/06/24 1545 08/06/24 2302 08/07/24 0227 08/07/24 0521  HGB 9.1* 9.9* 9.7*  --  9.1*  --   HCT 30.3* 29.0* 31.5*  --  29.9*  --   PLT 190  --  PLATELET CLUMPS NOTED ON SMEAR, UNABLE TO ESTIMATE  --  239  --   HEPARINUNFRC  --   --   --   --   --  0.58  CREATININE 1.41* 1.30* 1.24*  --  1.20*  --   CKTOTAL  --   --   --  882* 932*  --     Estimated Creatinine Clearance: 24.3 mL/min (A) (by C-G formula based on SCr of 1.2 mg/dL (H)).   Medical History: Past Medical History:  Diagnosis Date   AKI (acute kidney injury) 04/30/2018   Community acquired pneumonia 04/30/2018   Dementia (HCC)    Hyperlipidemia    Hypertension    Leukocytosis 04/30/2018    Assessment: Active Problem(s): sudden onset of SOB with cyanosis from [facility] (just d/c'd from hospital 2 HOURS ago)  PMH: AKI, CAP, dementia, HLD, HTN, leukocytosis, GERD, eczema, aortic atherosclerosis, CKD3, dementia,  Significant events: Fall at ALF, L femoral neck fracture. Pt underwent L THA (anterior approach) on 9/22.    AC/Heme: PE. Start IV heparin  S/p THA 08/03/24 (ASA 325mg  BID post-op)   - 9/25 CT: Pulmonary emboli within all lobes of the right lung. CT evidence of right heart strain (RV/LV Ratio = 1.15) consistent with at least submassive (intermediate risk) PE  08/07/24 Heparin  level = 0.58 (therapeutic) with heparin  gtt @ 900 units/hr Hgb = 9.1 (low but stable), Pltc wnl RN reports during the night, patient pulled out her left PIV and there was some minimal bleeding  due to trauma that resolved.  No active bleeding reported since.  Goal of Therapy:  Heparin  level 0.3-0.7 units/ml Monitor platelets by anticoagulation protocol: Yes   Plan:  Continue Heparin  infusion at 900 units/hr Recheck heparin  level in 8 hrs to confirm therapeutic dose Daily HL and CBC   Vinny Taranto, PharmD 08/07/2024,6:34 AM

## 2024-08-07 NOTE — Progress Notes (Signed)
 VASCULAR LAB    Left lower extremity venous duplex has been performed.  See CV proc for preliminary results.   Canaan Holzer, RVT 08/07/2024, 9:44 AM

## 2024-08-07 NOTE — Consult Note (Addendum)
 NAME:  Mandy Adams, MRN:  [id number], DOB:  1932-08-28, LOS: 0 ADMISSION DATE:  08/06/2024, CONSULTATION DATE:  08/07/2024 REFERRING MD:  Dr. Verdene - [facility], CHIEF COMPLAINT:  PE/Pleural effusion    History of Present Illness:  Mandy Adams is a 89 y.o. female with a PMH significant for HTN. HLD, and Dementia who presented to the ED at [facility] from SNF same day of discharge with complaints of shortness of breath. Work on admission with CTA chest revealed acute PE with CT evidence of right heart strain with RV/LV ratio of 1.15 and very large hiatal hernia containing most of the stomach and transverse colon. In this setting PCCM was consulted for further management and assistance    Pertinent  Medical History  HTN. HLD, and Dementia  Significant Hospital Events: Including procedures, antibiotic start and stop dates in addition to other pertinent events   9/26  presented to the ED at [facility] from SNF same day of discharge with complaints of shortness of breath. Work on admission revealed acute PE and very large hiatal hernia   Interim History / Subjective:  Seen lying in bed with complaints of hip pain Alert to self and location   Objective    Blood pressure (!) 94/40, pulse (!) 105, temperature 98 F (36.7 C), temperature source Oral, resp. rate 14, height 5' (1.524 m), weight 55.1 kg, SpO2 95%.        Intake/Output Summary (Last 24 hours) at 08/07/2024 0802 Last data filed at 08/07/2024 9349 Gross per 24 hour  Intake 530.2 ml  Output 1400 ml  Net -869.8 ml   Filed Weights   08/06/24 2130  Weight: 55.1 kg    Examination: General: Acute on chronically ill appearing elderly female lying in bed, in NAD HEENT: [place]/AT, MM pink/moist, PERRL,  Neuro: Alert and oriented to person and place, confused to situation, non-focal CV: s1s2 regular rate and rhythm, no murmur, rubs, or gallops,  PULM:  Diminished bases, on 4 L [place], no increased work of breathing,  GI: soft, bowel  sounds active in all 4 quadrants, non-tender, non-distended, tolerating oral diet  Extremities: warm/dry, no edema  Skin: no rashes or lesions  Resolved problem list   Assessment and Plan   Acute right pulmonary emboli   -CTA chest revealed acute right PE with CT evidence of right heart strain with RV/LV ratio of 1.15 and  Large bilateral pleural effusions  -Seen on CTA with compressive atelectasis  Large hiatal hernia  -Very large hiatal hernia containing most of the stomach and transverse colon.  P: Continue heparin  drip  No acute indications to intervene on PE at present, dyspnea likely more related to decreased pulmonary function in the setting of large bilateral effusions and very large hiatal hernia  Follow ECHO and lower extremity dopplers  Consider diuresing once cardiac function observed per ECHO  Consider surgical consult  Aspiration precaution   Labs   CBC: Recent Labs  Lab 08/04/24 0341 08/04/24 1857 08/05/24 0343 08/05/24 1534 08/06/24 0332 08/06/24 1538 08/06/24 1545 08/07/24 0227  WBC 12.1*  --  10.6*  --  12.4*  --  11.6* 11.4*  NEUTROABS  --   --   --   --   --   --  8.9*  --   HGB 8.2*   < > 7.3* 9.9* 9.1* 9.9* 9.7* 9.1*  HCT 27.9*   < > 25.0* 32.7* 30.3* 29.0* 31.5* 29.9*  MCV 90.0  --  90.6  --  89.6  --  89.2 89.0  PLT 166  --  208  --  190  --  PLATELET CLUMPS NOTED ON SMEAR, UNABLE TO ESTIMATE 239   < > = values in this interval not displayed.    Basic Metabolic Panel: Recent Labs  Lab 08/03/24 0937 08/03/24 1848 08/04/24 0341 08/05/24 0343 08/06/24 0332 08/06/24 1538 08/06/24 1545 08/06/24 2302 08/07/24 0227  NA 141   < > 137 137 141 142 140  --  141  K 4.1   < > 4.2 4.1 4.5 3.9 4.0  --  4.2  CL 108   < > 106 109 113* 113* 113*  --  113*  CO2 18*   < > 19* 16* 18*  --  18*  --  20*  GLUCOSE 102*   < > 116* 156* 117* 121* 124*  --  90  BUN 30*   < > 34* 39* 39* 32* 34*  --  29*  CREATININE 1.23*   < > 1.63* 1.67* 1.41* 1.30* 1.24*   --  1.20*  CALCIUM  8.3*   < > 8.1* 7.8* 8.3*  --  8.3*  --  8.1*  MG 2.1  --   --   --   --   --   --  2.4 2.3  PHOS  --   --   --   --   --   --   --  1.9* 1.8*   < > = values in this interval not displayed.   GFR: Estimated Creatinine Clearance: 24.3 mL/min (A) (by C-G formula based on SCr of 1.2 mg/dL (H)). Recent Labs  Lab 08/03/24 1848 08/04/24 0341 08/05/24 0343 08/06/24 0332 08/06/24 1537 08/06/24 1545 08/06/24 2302 08/07/24 0227  WBC 15.5*   < > 10.6* 12.4*  --  11.6*  --  11.4*  LATICACIDVEN 1.7  --   --   --  1.8  --  1.5 1.0   < > = values in this interval not displayed.    Liver Function Tests: Recent Labs  Lab 08/03/24 0937 08/07/24 0227  AST 27 65*  ALT 11 6  ALKPHOS 75 73  BILITOT 0.5 0.5  PROT 6.0* 5.5*  ALBUMIN  3.4* 3.2*   Recent Labs  Lab 08/03/24 0937  LIPASE 14   No results for input(s): AMMONIA in the last 168 hours.  ABG    Component Value Date/Time   HCO3 19.7 (L) 08/06/2024 1545   TCO2 18 (L) 08/06/2024 1538   ACIDBASEDEF 7.4 (H) 08/06/2024 1545   O2SAT 37.3 08/06/2024 1545     Coagulation Profile: No results for input(s): INR, PROTIME in the last 168 hours.  Cardiac Enzymes: Recent Labs  Lab 08/03/24 0937 08/06/24 2302 08/07/24 0227  CKTOTAL 63 882* 932*    HbA1C: No results found for: HGBA1C  CBG: Recent Labs  Lab 08/03/24 0919  GLUCAP 105*    Review of Systems:   Unable to assess   Past Medical History:  She,  has a past medical history of AKI (acute kidney injury) (04/30/2018), Community acquired pneumonia (04/30/2018), Dementia (HCC), Hyperlipidemia, Hypertension, and Leukocytosis (04/30/2018).   Surgical History:   Past Surgical History:  Procedure Laterality Date   ABDOMINAL HYSTERECTOMY     TOTAL HIP ARTHROPLASTY Left 08/03/2024   Procedure: ARTHROPLASTY, HIP, TOTAL, ANTERIOR APPROACH;  Surgeon: Jerri Kay HERO, MD;  Location: [facility]S;  Service: Orthopedics;  Laterality: Left;     Social  History:   reports that she has  never smoked. She has never used smokeless tobacco. She reports that she does not drink alcohol  and does not use drugs.   Family History:  Her family history includes Heart failure in her father.   Allergies No Known Allergies   Home Medications  Prior to Admission medications   Medication Sig Start Date End Date Taking? Authorizing Provider  acetaminophen  (TYLENOL ) 325 MG tablet Take 650 mg by mouth every 6 (six) hours as needed for moderate pain (pain score 4-6) or mild pain (pain score 1-3).    [provider]  aspirin  EC 325 MG tablet Take 1 tablet (325 mg total) by mouth in the morning and at bedtime for 28 days. 08/03/24 08/31/24  Jerri Kay HERO, MD  cetirizine (ZYRTEC) 10 MG tablet Take 10 mg by mouth daily.    [provider]  cyanocobalamin  1000 MCG tablet Take 1 tablet (1,000 mcg total) by mouth daily. 08/06/24   Krishnan, Gokul, MD  docusate sodium  (COLACE) 100 MG capsule Take 1 capsule (100 mg total) by mouth 2 (two) times daily. 08/06/24   Krishnan, Gokul, MD  feeding supplement (ENSURE PLUS HIGH PROTEIN) LIQD Take 237 mLs by mouth 2 (two) times daily between meals. 08/06/24   Krishnan, Gokul, MD  [facility] ALLERGY RELIEF 50 MCG/ACT nasal spray Place 1 spray into both nostrils daily. 03/18/23   [provider]  HYDROcodone -acetaminophen  (NORCO/VICODIN) 5-325 MG tablet Take 1 tablet by mouth every 6 (six) hours as needed for moderate pain (pain score 4-6). 08/03/24   Jerri Kay HERO, MD  meclizine  (ANTIVERT ) 25 MG tablet Take 25 mg by mouth every 8 (eight) hours as needed (Vertigo).    [provider]  memantine  (NAMENDA ) 10 MG tablet Take 1 tablet (10 mg total) by mouth 2 (two) times daily. Patient taking differently: Take 10 mg by mouth at bedtime. 05/09/22   Whitfield Raisin, NP  methocarbamol  (ROBAXIN ) 500 MG tablet Take 1 tablet (500 mg total) by mouth every 8 (eight) hours as needed for muscle spasms. 08/06/24   Krishnan,  Gokul, MD  mirtazapine  (REMERON ) 15 MG tablet Take 15 mg by mouth at bedtime.    [provider]  pantoprazole  (PROTONIX ) 20 MG tablet Take 20 mg by mouth daily. 02/04/18   [provider]  polyethylene glycol (MIRALAX  / GLYCOLAX ) 17 g packet Take 17 g by mouth daily as needed for mild constipation. 08/06/24   Krishnan, Gokul, MD  sertraline  (ZOLOFT ) 50 MG tablet Take 50 mg by mouth daily. 04/24/23   [provider]  VITAMIN D PO Take 50 mcg by mouth daily.    [provider]     Critical care time: NA  Tyesha Joffe D. Harris, NP-C [facility] Personal contact information can be found on Amion  If no contact or response made please call [phone] 08/07/2024, 8:33 AM

## 2024-08-07 NOTE — Progress Notes (Addendum)
 [facility] PROGRESS NOTE   Mandy Adams FMW:[id number] DOB: 1932-05-25 DOA: 08/06/2024  PCP: Patient, No Pcp Per  Brief History: 89 y.o. female with medical history significant of AKI, CAP, dementia, hyperlipidemia, hypertension, leukocytosis, GERD, eczema, aortic atherosclerosis, stage IIIa CKD who was hospitalized a few days ago after a fall resulting in left hip fracture.  She underwent surgery.  She also developed acute kidney injury due to dehydration.  Was also transfused PRBC during previous hospitalization.  She was stabilized and then discharged to SNF.  Within a few hours after arriving at SNF she started getting short of breath and was noted to be cyanotic.  She was brought back to the hospital and found to have acute pulmonary embolism  Consultants: None  Procedures: Echocardiogram.  Lower extremity Doppler    Subjective/Interval History: Overnight events noted.  Patient had an episode of confusion overnight for which she was given Haldol  with improvement.  Seems to be a little bit more appropriate and at baseline this morning.  She did not remember me from yesterday morning.  She does have dementia at baseline.    Assessment/Plan:  Acute pulmonary embolism/acute respiratory failure with hypoxia This is in the setting of recent hip fracture.  She was on aspirin  twice a day for DVT prophylaxis after her recent surgery. CT angiogram showed PE.  Case was discussed with pulmonology who did not recommend thrombolytics. Echocardiogram and lower extremity Doppler studies pending. She appears to be hemodynamically stable. Continue with IV heparin .  Plan will be to transition to oral anticoagulants tomorrow.  No need for aspirin  going forward. She was noted to be hypoxic.  She is currently on oxygen.  Saturations are in the 90s.  Continue to monitor.  Wean down if possible to maintain saturations greater than 90%. Elevated BNP and mildly elevated troponin levels also  noted.  Possibly from right heart strain.  Follow-up on echocardiogram. Large bilateral pleural effusions noted on CT scan.  Could have some degree of volume overload.  May need to give her her some Lasix .  Will await echocardiogram first.  Acute encephalopathy in the setting of dementia Likely multifactorial including acute illness as well as hospital stay.  Improved with Haldol .  Continue to monitor.  Reorient daily. Continue Namenda .  Recent left hip fracture status post surgery on 9/22 Occasional pain in the left lower extremity.  Continue with PT and OT from tomorrow.  Chronic kidney disease stage IIIa/metabolic acidosis Had acute kidney injury during previous hospitalization but creatinine is now back to baseline.  Normocytic anemia Recent acute blood loss anemia for which she was transfused.  Hemoglobin stable.  Recently done anemia panel reviewed.  B12 level was noted to be low normal.  Continue with B12 supplementation.  History of essential hypertension/recent hypotension She was hypotensive during previous hospitalization which improved after blood transfusion.  She was on metoprolol recently but it was discontinued by her outpatient providers.  Monitor blood pressures closely.  Leukocytosis Likely reactive.  L1 vertebral body compression fracture Age-indeterminate L1 compression fracture was noted incidentally on recent CT scan.  She has been asymptomatic.  Sacral decubitus stage II right buttock Wound care.  Goals of Care Discussed with son. He and his sister request a palliative consult as they state that the patient would not want such aggressive care. They want to de-escalate care. Will get PMT consult. Continue DNR status.  DVT Prophylaxis: On IV heparin  Code Status: DNR Family Communication: Will update son later today Disposition Plan: Hopefully  back to SNF in the next 2 to 3 days  Status is: Observation The patient will require care spanning > 2 midnights and  should be moved to inpatient because: Acute PE, encephalopathy      Medications: Scheduled:  Chlorhexidine  Gluconate Cloth  6 each Topical Daily   Chlorhexidine  Gluconate Cloth  6 each Topical Daily   memantine   10 mg Oral QHS   mirtazapine   15 mg Oral QHS   mupirocin  ointment  1 Application Nasal BID   pantoprazole   20 mg Oral Daily   sertraline   50 mg Oral Daily   Continuous:  heparin  900 Units/hr (08/07/24 0614)   potassium PHOSPHATE  IVPB (in mmol) 43 mL/hr at 08/07/24 0614   PRN:acetaminophen  **OR** acetaminophen , HYDROcodone -acetaminophen , meclizine , ondansetron  **OR** ondansetron  (ZOFRAN ) IV, mouth rinse, polyethylene glycol, sodium chloride  flush  Antibiotics: Anti-infectives (From admission, onward)    None       Objective:  Vital Signs  Vitals:   08/07/24 0400 08/07/24 0416 08/07/24 0500 08/07/24 0600  BP:   (!) 85/65 (!) 94/40  Pulse: (!) 131  (!) 105   Resp: (!) 32  14 14  Temp:  98 F (36.7 C)    TempSrc:  Oral    SpO2: 100%  95%   Weight:      Height:        Intake/Output Summary (Last 24 hours) at 08/07/2024 0838 Last data filed at 08/07/2024 0650 Gross per 24 hour  Intake 530.2 ml  Output 1400 ml  Net -869.8 ml   Filed Weights   08/06/24 2130  Weight: 55.1 kg    General appearance: Awake alert.  In no distress.  Distracted. Resp: Clear to auscultation bilaterally.  Normal effort Cardio: S1-S2 is normal regular.  No S3-S4.  No rubs murmurs or bruit GI: Abdomen is soft.  Nontender nondistended.  Bowel sounds are present normal.  No masses organomegaly Extremities: No significant swelling or bruising noted in the left hip area Neurologic: No focal neurological deficits noted   Lab Results:  Data Reviewed: I have personally reviewed following labs and reports of the imaging studies  CBC: Recent Labs  Lab 08/04/24 0341 08/04/24 1857 08/05/24 0343 08/05/24 1534 08/06/24 0332 08/06/24 1538 08/06/24 1545 08/07/24 0227  WBC 12.1*   --  10.6*  --  12.4*  --  11.6* 11.4*  NEUTROABS  --   --   --   --   --   --  8.9*  --   HGB 8.2*   < > 7.3* 9.9* 9.1* 9.9* 9.7* 9.1*  HCT 27.9*   < > 25.0* 32.7* 30.3* 29.0* 31.5* 29.9*  MCV 90.0  --  90.6  --  89.6  --  89.2 89.0  PLT 166  --  208  --  190  --  PLATELET CLUMPS NOTED ON SMEAR, UNABLE TO ESTIMATE 239   < > = values in this interval not displayed.    Basic Metabolic Panel: Recent Labs  Lab 08/03/24 0937 08/03/24 1848 08/04/24 0341 08/05/24 0343 08/06/24 0332 08/06/24 1538 08/06/24 1545 08/06/24 2302 08/07/24 0227  NA 141   < > 137 137 141 142 140  --  141  K 4.1   < > 4.2 4.1 4.5 3.9 4.0  --  4.2  CL 108   < > 106 109 113* 113* 113*  --  113*  CO2 18*   < > 19* 16* 18*  --  18*  --  20*  GLUCOSE 102*   < >  116* 156* 117* 121* 124*  --  90  BUN 30*   < > 34* 39* 39* 32* 34*  --  29*  CREATININE 1.23*   < > 1.63* 1.67* 1.41* 1.30* 1.24*  --  1.20*  CALCIUM  8.3*   < > 8.1* 7.8* 8.3*  --  8.3*  --  8.1*  MG 2.1  --   --   --   --   --   --  2.4 2.3  PHOS  --   --   --   --   --   --   --  1.9* 1.8*   < > = values in this interval not displayed.    GFR: Estimated Creatinine Clearance: 24.3 mL/min (A) (by C-G formula based on SCr of 1.2 mg/dL (H)).  Liver Function Tests: Recent Labs  Lab 08/03/24 0937 08/07/24 0227  AST 27 65*  ALT 11 6  ALKPHOS 75 73  BILITOT 0.5 0.5  PROT 6.0* 5.5*  ALBUMIN  3.4* 3.2*    Recent Labs  Lab 08/03/24 0937  LIPASE 14   Cardiac Enzymes: Recent Labs  Lab 08/03/24 0937 08/06/24 2302 08/07/24 0227  CKTOTAL 63 882* 932*    BNP (last 3 results) Recent Labs    08/06/24 1545  PROBNP 22,337.0*   CBG: Recent Labs  Lab 08/03/24 0919  GLUCAP 105*     Anemia Panel: Recent Labs    08/05/24 0343  VITAMINB12 249  FOLATE 9.0  FERRITIN 84  TIBC 288  IRON 17*  RETICCTPCT 1.2    Recent Results (from the past 240 hours)  Resp panel by RT-PCR (RSV, Flu A&B, Covid) Anterior Nasal Swab     Status: None    Collection Time: 08/06/24  6:45 PM   Specimen: Anterior Nasal Swab  Result Value Ref Range Status   SARS Coronavirus 2 by RT PCR NEGATIVE NEGATIVE Final    Comment: (NOTE) SARS-CoV-2 target nucleic acids are NOT DETECTED.  The SARS-CoV-2 RNA is generally detectable in upper respiratory specimens during the acute phase of infection. The lowest concentration of SARS-CoV-2 viral copies this assay can detect is 138 copies/mL. A negative result does not preclude SARS-Cov-2 infection and should not be used as the sole basis for treatment or other patient management decisions. A negative result may occur with  improper specimen collection/handling, submission of specimen other than nasopharyngeal swab, presence of viral mutation(s) within the areas targeted by this assay, and inadequate number of viral copies(<138 copies/mL). A negative result must be combined with clinical observations, patient history, and epidemiological information. The expected result is Negative.  Fact Sheet for Patients:  BloggerCourse.com  Fact Sheet for Healthcare Providers:  SeriousBroker.it  This test is no t yet approved or cleared by the [place]  FDA and  has been authorized for detection and/or diagnosis of SARS-CoV-2 by FDA under an Emergency Use Authorization (EUA). This EUA will remain  in effect (meaning this test can be used) for the duration of the COVID-19 declaration under Section 564(b)(1) of the Act, 21 U.S.C.section 360bbb-3(b)(1), unless the authorization is terminated  or revoked sooner.       Influenza A by PCR NEGATIVE NEGATIVE Final   Influenza B by PCR NEGATIVE NEGATIVE Final    Comment: (NOTE) The Xpert Xpress SARS-CoV-2/FLU/RSV plus assay is intended as an aid in the diagnosis of influenza from Nasopharyngeal swab specimens and should not be used as a sole basis for treatment. Nasal washings and aspirates are unacceptable for  Xpert  Xpress SARS-CoV-2/FLU/RSV testing.  Fact Sheet for Patients: BloggerCourse.com  Fact Sheet for Healthcare Providers: SeriousBroker.it  This test is not yet approved or cleared by the [place]  FDA and has been authorized for detection and/or diagnosis of SARS-CoV-2 by FDA under an Emergency Use Authorization (EUA). This EUA will remain in effect (meaning this test can be used) for the duration of the COVID-19 declaration under Section 564(b)(1) of the Act, 21 U.S.C. section 360bbb-3(b)(1), unless the authorization is terminated or revoked.     Resp Syncytial Virus by PCR NEGATIVE NEGATIVE Final    Comment: (NOTE) Fact Sheet for Patients: BloggerCourse.com  Fact Sheet for Healthcare Providers: SeriousBroker.it  This test is not yet approved or cleared by the [place]  FDA and has been authorized for detection and/or diagnosis of SARS-CoV-2 by FDA under an Emergency Use Authorization (EUA). This EUA will remain in effect (meaning this test can be used) for the duration of the COVID-19 declaration under Section 564(b)(1) of the Act, 21 U.S.C. section 360bbb-3(b)(1), unless the authorization is terminated or revoked.  Performed at [facility], [address], [place], [place] [address]   MRSA Next Gen by PCR, Nasal     Status: Abnormal   Collection Time: 08/06/24  9:47 PM   Specimen: Nasal Mucosa; Nasal Swab  Result Value Ref Range Status   MRSA by PCR Next Gen DETECTED (A) NOT DETECTED Final    Comment: CRITICAL RESULT CALLED TO, READ BACK BY AND VERIFIED WITH: HAYES S. @ 0551 08/07/2024 MCLEAN K. (NOTE) The GeneXpert MRSA Assay (FDA approved for NASAL specimens only), is one component of a comprehensive MRSA colonization surveillance program. It is not intended to diagnose MRSA infection nor to guide or monitor treatment for MRSA  infections. Test performance is not FDA approved in patients less than 89 years old. Performed at [facility], [address] [place], [place] [address]       Radiology Studies: CT Angio Chest PE W and/or Wo Contrast Result Date: 08/06/2024 CLINICAL DATA:  Pulmonary embolism (PE) suspected, high prob EXAM: CT ANGIOGRAPHY CHEST WITH CONTRAST TECHNIQUE: Multidetector CT imaging of the chest was performed using the standard protocol during bolus administration of intravenous contrast. Multiplanar CT image reconstructions and MIPs were obtained to evaluate the vascular anatomy. RADIATION DOSE REDUCTION: This exam was performed according to the departmental dose-optimization program which includes automated exposure control, adjustment of the mA and/or kV according to patient size and/or use of iterative reconstruction technique. CONTRAST:  75mL OMNIPAQUE  IOHEXOL  350 MG/ML SOLN COMPARISON:  08/03/2024 FINDINGS: Cardiovascular: Pulmonary emboli noted in the right upper lobe, pulmonary emboli noted in all lobes of the right lung. No emboli seen on the left. Evidence of right heart strain with RV to LV ratio of 1.15. No evidence of aortic aneurysm. Coronary artery and aortic atherosclerosis. Mediastinum/Nodes: No mediastinal, hilar, or axillary adenopathy. Trachea and esophagus are unremarkable. Thyroid unremarkable. Very large hiatal hernia containing much of the stomach and transverse colon. Lungs/Pleura: Large bilateral pleural effusions. Compressive atelectasis in the lower lobes bilaterally. Upper Abdomen: No acute findings Musculoskeletal: Chest wall soft tissues are unremarkable. No acute bony abnormality. Review of the MIP images confirms the above findings. IMPRESSION: Pulmonary emboli within all lobes of the right lung. CT evidence of right heart strain (RV/LV Ratio = 1.15) consistent with at least submassive (intermediate risk) PE. The presence of right heart strain has been  associated with an increased risk of morbidity and mortality. Please refer to the  Code PE Focused order set in EPIC. Large hiatal hernia containing most of the stomach and transverse colon. Large bilateral pleural effusions. Compressive atelectasis in the lower lobes. Aortic Atherosclerosis (ICD10-I70.0). These results were called by telephone at the time of interpretation on 08/06/2024 at 7:32 pm to provider HAYLEY NAASZ , who verbally acknowledged these results. Electronically Signed   By: Franky Crease M.D.   On: 08/06/2024 19:36       LOS: 0 days   Mandy Adams  [facility] Pager on [email or web address]  08/07/2024, 8:38 AM

## 2024-08-07 NOTE — Discharge Instructions (Addendum)
 Information on my medicine - ELIQUIS  (apixaban )  This medication education was reviewed with me or my healthcare representative as part of my discharge preparation.   Why was Eliquis  prescribed for you? Eliquis  was prescribed to treat blood clots that may have been found in the veins of your legs (deep vein thrombosis) or in your lungs (pulmonary embolism) and to reduce the risk of them occurring again.  What do You need to know about Eliquis  ? The starting dose is 10 mg (two 5 mg tablets) taken TWICE daily for the FIRST SEVEN (7) DAYS, then on Friday 08/14/2024  the dose is reduced to ONE 5 mg tablet taken TWICE daily.  Eliquis  may be taken with or without food.   Try to take the dose about the same time in the morning and in the evening. If you have difficulty swallowing the tablet whole please discuss with your pharmacist how to take the medication safely.  Take Eliquis  exactly as prescribed and DO NOT stop taking Eliquis  without talking to the doctor who prescribed the medication.  Stopping may increase your risk of developing a new blood clot.  Refill your prescription before you run out.  After discharge, you should have regular check-up appointments with your healthcare provider that is prescribing your Eliquis .    What do you do if you miss a dose? If a dose of ELIQUIS  is not taken at the scheduled time, take it as soon as possible on the same day and twice-daily administration should be resumed. The dose should not be doubled to make up for a missed dose.  Important Safety Information A possible side effect of Eliquis  is bleeding. You should call your healthcare provider right away if you experience any of the following: Bleeding from an injury or your nose that does not stop. Unusual colored urine (red or dark brown) or unusual colored stools (red or black). Unusual bruising for unknown reasons. A serious fall or if you hit your head (even if there is no bleeding).  Some  medicines may interact with Eliquis  and might increase your risk of bleeding or clotting while on Eliquis . To help avoid this, consult your healthcare provider or pharmacist prior to using any new prescription or non-prescription medications, including herbals, vitamins, non-steroidal anti-inflammatory drugs (NSAIDs) and supplements.  This website has more information on Eliquis  (apixaban ): [email or web address] .[email or web address] dena

## 2024-08-08 ENCOUNTER — Encounter (HOSPITAL_COMMUNITY): Payer: Self-pay | Admitting: Internal Medicine

## 2024-08-08 DIAGNOSIS — F0390 Unspecified dementia without behavioral disturbance: Secondary | ICD-10-CM

## 2024-08-08 DIAGNOSIS — J90 Pleural effusion, not elsewhere classified: Secondary | ICD-10-CM | POA: Diagnosis not present

## 2024-08-08 DIAGNOSIS — Z7189 Other specified counseling: Secondary | ICD-10-CM | POA: Diagnosis not present

## 2024-08-08 DIAGNOSIS — Z79899 Other long term (current) drug therapy: Secondary | ICD-10-CM

## 2024-08-08 DIAGNOSIS — Z515 Encounter for palliative care: Secondary | ICD-10-CM

## 2024-08-08 DIAGNOSIS — J9601 Acute respiratory failure with hypoxia: Secondary | ICD-10-CM | POA: Diagnosis not present

## 2024-08-08 DIAGNOSIS — N1831 Chronic kidney disease, stage 3a: Secondary | ICD-10-CM

## 2024-08-08 DIAGNOSIS — Z66 Do not resuscitate: Secondary | ICD-10-CM

## 2024-08-08 DIAGNOSIS — R5381 Other malaise: Secondary | ICD-10-CM

## 2024-08-08 DIAGNOSIS — I2609 Other pulmonary embolism with acute cor pulmonale: Secondary | ICD-10-CM

## 2024-08-08 DIAGNOSIS — R638 Other symptoms and signs concerning food and fluid intake: Secondary | ICD-10-CM

## 2024-08-08 DIAGNOSIS — R52 Pain, unspecified: Secondary | ICD-10-CM

## 2024-08-08 LAB — CBC
HCT: 29.7 % — ABNORMAL LOW (ref 36.0–46.0)
Hemoglobin: 9.1 g/dL — ABNORMAL LOW (ref 12.0–15.0)
MCH: 28.3 pg (ref 26.0–34.0)
MCHC: 30.6 g/dL (ref 30.0–36.0)
MCV: 92.2 fL (ref 80.0–100.0)
Platelets: 244 K/uL (ref 150–400)
RBC: 3.22 MIL/uL — ABNORMAL LOW (ref 3.87–5.11)
RDW: 17.5 % — ABNORMAL HIGH (ref 11.5–15.5)
WBC: 7.7 K/uL (ref 4.0–10.5)
nRBC: 0.3 % — ABNORMAL HIGH (ref 0.0–0.2)

## 2024-08-08 LAB — BASIC METABOLIC PANEL WITH GFR
Anion gap: 10 (ref 5–15)
BUN: 20 mg/dL (ref 8–23)
CO2: 19 mmol/L — ABNORMAL LOW (ref 22–32)
Calcium: 8.2 mg/dL — ABNORMAL LOW (ref 8.9–10.3)
Chloride: 110 mmol/L (ref 98–111)
Creatinine, Ser: 1.03 mg/dL — ABNORMAL HIGH (ref 0.44–1.00)
GFR, Estimated: 51 mL/min — ABNORMAL LOW (ref >60)
Glucose, Bld: 70 mg/dL (ref 70–99)
Potassium: 4.4 mmol/L (ref 3.5–5.1)
Sodium: 139 mmol/L (ref 135–145)

## 2024-08-08 LAB — MAGNESIUM: Magnesium: 2.4 mg/dL (ref 1.7–2.4)

## 2024-08-08 LAB — PHOSPHORUS: Phosphorus: 2.5 mg/dL (ref 2.5–4.6)

## 2024-08-08 MED ORDER — HALOPERIDOL LACTATE 5 MG/ML IJ SOLN: 0.5000 mg | INTRAMUSCULAR | Status: DC | PRN

## 2024-08-08 MED ORDER — GLYCOPYRROLATE 0.2 MG/ML IJ SOLN: 0.2000 mg | INTRAMUSCULAR | Status: DC | PRN

## 2024-08-08 MED ORDER — POLYVINYL ALCOHOL 1.4 % OP SOLN: 1.0000 [drp] | Freq: Four times a day (QID) | OPHTHALMIC | Status: DC | PRN

## 2024-08-08 MED ORDER — OXYCODONE HCL 20 MG/ML PO CONC: 5.0000 mg | ORAL | Status: DC | PRN

## 2024-08-08 MED ORDER — HYDROMORPHONE HCL 1 MG/ML IJ SOLN
0.5000 mg | INTRAMUSCULAR | Status: DC | PRN
Start: 2024-08-08 — End: 2024-08-11
  Administered 2024-08-10: 0.5 mg via INTRAVENOUS
  Filled 2024-08-08: qty 0.5

## 2024-08-08 MED ORDER — HALOPERIDOL LACTATE 2 MG/ML PO CONC: 0.5000 mg | ORAL | Status: DC | PRN

## 2024-08-08 MED ORDER — FUROSEMIDE 10 MG/ML IJ SOLN
20.0000 mg | Freq: Every day | INTRAMUSCULAR | Status: DC
  Administered 2024-08-08 – 2024-08-10 (×3): 20 mg via INTRAVENOUS
  Filled 2024-08-08 (×3): qty 2

## 2024-08-08 MED ORDER — GLYCOPYRROLATE 1 MG PO TABS: 1.0000 mg | ORAL_TABLET | ORAL | Status: DC | PRN

## 2024-08-08 MED ORDER — HALOPERIDOL 0.5 MG PO TABS: 0.5000 mg | ORAL_TABLET | ORAL | Status: DC | PRN

## 2024-08-08 MED ORDER — BIOTENE DRY MOUTH MT LIQD: 15.0000 mL | OROMUCOSAL | Status: DC | PRN

## 2024-08-08 MED ORDER — HYDROMORPHONE HCL 1 MG/ML IJ SOLN: 0.5000 mg | INTRAMUSCULAR | Status: DC | PRN

## 2024-08-08 NOTE — Plan of Care (Signed)

## 2024-08-08 NOTE — Plan of Care (Signed)

## 2024-08-08 NOTE — Progress Notes (Signed)
 [facility] PROGRESS NOTE   Mandy Adams FMW:[id number] DOB: 16-Nov-1931 DOA: 08/06/2024  PCP: Patient, No Pcp Per  Brief History: 89 y.o. female with medical history significant of AKI, CAP, dementia, hyperlipidemia, hypertension, leukocytosis, GERD, eczema, aortic atherosclerosis, stage IIIa CKD who was hospitalized a few days ago after a fall resulting in left hip fracture.  She underwent surgery.  She also developed acute kidney injury due to dehydration.  Was also transfused PRBC during previous hospitalization.  She was stabilized and then discharged to SNF.  Within a few hours after arriving at SNF she started getting short of breath and was noted to be cyanotic.  She was brought back to the hospital and found to have acute pulmonary embolism  Consultants: None  Procedures: Echocardiogram.  Lower extremity Doppler    Subjective/Interval History: Patient lying comfortably on the bed.  She was noted to be asleep but easily arousable.  Denies any complaints this morning.  Remains distracted.    Assessment/Plan:  Acute pulmonary embolism/acute respiratory failure with hypoxia This is in the setting of recent hip fracture.  She was on aspirin  twice a day for DVT prophylaxis after her recent surgery. CT angiogram showed PE.  Case was discussed with pulmonology who did not recommend thrombolytics.  Patient was also seen by pulmonology. Echocardiogram shows normal LVEF.  Right ventricular function is mildly reduced. No DVT noted on Doppler studies. Patient was transition from IV heparin  to Eliquis .  Aspirin  discontinued. She remains on oxygen.  Try to wean down to room air to keep saturations greater than 90%.  Bilateral pleural effusion/possible volume overload Elevated BNP and mildly elevated troponin levels also noted.   Large bilateral pleural effusions noted on CT scan.  Could have some degree of volume overload.  Echocardiogram shows normal LVEF.  She is  hemodynamically stable. Will give her furosemide  mainly for palliative purposes.  Family does not want aggressive care.  Acute encephalopathy in the setting of dementia Likely multifactorial including acute illness as well as hospital stay.  Improved with Haldol .  Continue to monitor.  Reorient daily. Continue Namenda .  Recent left hip fracture status post surgery on 9/22 Occasional pain in the left lower extremity.  He can OT.  Chronic kidney disease stage IIIa/metabolic acidosis Had acute kidney injury during previous hospitalization but creatinine is now back to baseline.  Normocytic anemia Recent acute blood loss anemia for which she was transfused.  Hemoglobin stable.  Recently done anemia panel reviewed.  B12 level was noted to be low normal.  Continue with B12 supplementation.  History of essential hypertension/recent hypotension She was hypotensive during previous hospitalization which improved after blood transfusion.  She was on metoprolol recently but it was discontinued by her outpatient providers.  Blood pressures are stable.  Leukocytosis Likely reactive.  Resolved today.  L1 vertebral body compression fracture Age-indeterminate L1 compression fracture was noted incidentally on recent CT scan.  She has been asymptomatic.  Sacral decubitus stage II right buttock Wound care.  Goals of Care Discussed with son. He and his sister request a palliative consult as they state that the patient would not want such aggressive care. They want to de-escalate care.  Palliative care consulted.  Continue with DNR status.  DVT Prophylaxis: Transitioned to apixaban  Code Status: DNR Family Communication: Son being updated periodically Disposition Plan: Hopefully back to SNF in the next 2 to 3 days   Medications: Scheduled:  apixaban   10 mg Oral BID   Followed by   NOREEN ON  08/14/2024] apixaban   5 mg Oral BID   Chlorhexidine  Gluconate Cloth  6 each Topical Daily   Chlorhexidine   Gluconate Cloth  6 each Topical Daily   memantine   10 mg Oral QHS   mirtazapine   15 mg Oral QHS   mupirocin  ointment  1 Application Nasal BID   pantoprazole   20 mg Oral Daily   sertraline   50 mg Oral Daily   Continuous:   PRN:acetaminophen  **OR** acetaminophen , HYDROcodone -acetaminophen , meclizine , ondansetron  **OR** ondansetron  (ZOFRAN ) IV, mouth rinse, polyethylene glycol, sodium chloride  flush   Objective:  Vital Signs  Vitals:   08/07/24 1900 08/07/24 2000 08/07/24 2302 08/08/24 0506  BP: (!) 110/57 108/89 (!) 139/53 133/87  Pulse: (!) 107  95 88  Resp: (!) 24 19  18   Temp:  97.7 F (36.5 C) 97.9 F (36.6 C) 97.8 F (36.6 C)  TempSrc:  Oral Oral Oral  SpO2: (!) 88%  100% 100%  Weight:      Height:        Intake/Output Summary (Last 24 hours) at 08/08/2024 0911 Last data filed at 08/08/2024 0645 Gross per 24 hour  Intake 351.23 ml  Output 1500 ml  Net -1148.77 ml   Filed Weights   08/06/24 2130  Weight: 55.1 kg    General appearance: Awake alert.  In no distress.  Distracted Resp: Clear to auscultation bilaterally.  Normal effort Cardio: S1-S2 is normal regular.  No S3-S4.  No rubs murmurs or bruit GI: Abdomen is soft.  Nontender nondistended.  Bowel sounds are present normal.  No masses organomegaly Extremities: No significant swelling or bruising noted in the left hip area.  Restricted range of motion of the left lower extremity Disoriented.  No focal neurological deficits.  Lab Results:  Data Reviewed: I have personally reviewed following labs and reports of the imaging studies  CBC: Recent Labs  Lab 08/05/24 0343 08/05/24 1534 08/06/24 0332 08/06/24 1538 08/06/24 1545 08/07/24 0227 08/08/24 0603  WBC 10.6*  --  12.4*  --  11.6* 11.4* 7.7  NEUTROABS  --   --   --   --  8.9*  --   --   HGB 7.3*   < > 9.1* 9.9* 9.7* 9.1* 9.1*  HCT 25.0*   < > 30.3* 29.0* 31.5* 29.9* 29.7*  MCV 90.6  --  89.6  --  89.2 89.0 92.2  PLT 208  --  190  --  PLATELET  CLUMPS NOTED ON SMEAR, UNABLE TO ESTIMATE 239 244   < > = values in this interval not displayed.    Basic Metabolic Panel: Recent Labs  Lab 08/03/24 0937 08/03/24 1848 08/05/24 0343 08/06/24 0332 08/06/24 1538 08/06/24 1545 08/06/24 2302 08/07/24 0227 08/08/24 0603  NA 141   < > 137 141 142 140  --  141 139  K 4.1   < > 4.1 4.5 3.9 4.0  --  4.2 4.4  CL 108   < > 109 113* 113* 113*  --  113* 110  CO2 18*   < > 16* 18*  --  18*  --  20* 19*  GLUCOSE 102*   < > 156* 117* 121* 124*  --  90 70  BUN 30*   < > 39* 39* 32* 34*  --  29* 20  CREATININE 1.23*   < > 1.67* 1.41* 1.30* 1.24*  --  1.20* 1.03*  CALCIUM  8.3*   < > 7.8* 8.3*  --  8.3*  --  8.1* 8.2*  MG 2.1  --   --   --   --   --  2.4 2.3 2.4  PHOS  --   --   --   --   --   --  1.9* 1.8* 2.5   < > = values in this interval not displayed.    GFR: Estimated Creatinine Clearance: 28.3 mL/min (A) (by C-G formula based on SCr of 1.03 mg/dL (H)).  Liver Function Tests: Recent Labs  Lab 08/03/24 0937 08/07/24 0227  AST 27 65*  ALT 11 6  ALKPHOS 75 73  BILITOT 0.5 0.5  PROT 6.0* 5.5*  ALBUMIN  3.4* 3.2*    Recent Labs  Lab 08/03/24 0937  LIPASE 14   Cardiac Enzymes: Recent Labs  Lab 08/03/24 0937 08/06/24 2302 08/07/24 0227  CKTOTAL 63 882* 932*    BNP (last 3 results) Recent Labs    08/06/24 1545  PROBNP 22,337.0*   CBG: Recent Labs  Lab 08/03/24 0919  GLUCAP 105*    Recent Results (from the past 240 hours)  Resp panel by RT-PCR (RSV, Flu A&B, Covid) Anterior Nasal Swab     Status: None   Collection Time: 08/06/24  6:45 PM   Specimen: Anterior Nasal Swab  Result Value Ref Range Status   SARS Coronavirus 2 by RT PCR NEGATIVE NEGATIVE Final    Comment: (NOTE) SARS-CoV-2 target nucleic acids are NOT DETECTED.  The SARS-CoV-2 RNA is generally detectable in upper respiratory specimens during the acute phase of infection. The lowest concentration of SARS-CoV-2 viral copies this assay can detect  is 138 copies/mL. A negative result does not preclude SARS-Cov-2 infection and should not be used as the sole basis for treatment or other patient management decisions. A negative result may occur with  improper specimen collection/handling, submission of specimen other than nasopharyngeal swab, presence of viral mutation(s) within the areas targeted by this assay, and inadequate number of viral copies(<138 copies/mL). A negative result must be combined with clinical observations, patient history, and epidemiological information. The expected result is Negative.  Fact Sheet for Patients:  BloggerCourse.com  Fact Sheet for Healthcare Providers:  SeriousBroker.it  This test is no t yet approved or cleared by the [place]  FDA and  has been authorized for detection and/or diagnosis of SARS-CoV-2 by FDA under an Emergency Use Authorization (EUA). This EUA will remain  in effect (meaning this test can be used) for the duration of the COVID-19 declaration under Section 564(b)(1) of the Act, 21 U.S.C.section 360bbb-3(b)(1), unless the authorization is terminated  or revoked sooner.       Influenza A by PCR NEGATIVE NEGATIVE Final   Influenza B by PCR NEGATIVE NEGATIVE Final    Comment: (NOTE) The Xpert Xpress SARS-CoV-2/FLU/RSV plus assay is intended as an aid in the diagnosis of influenza from Nasopharyngeal swab specimens and should not be used as a sole basis for treatment. Nasal washings and aspirates are unacceptable for Xpert Xpress SARS-CoV-2/FLU/RSV testing.  Fact Sheet for Patients: BloggerCourse.com  Fact Sheet for Healthcare Providers: SeriousBroker.it  This test is not yet approved or cleared by the [place]  FDA and has been authorized for detection and/or diagnosis of SARS-CoV-2 by FDA under an Emergency Use Authorization (EUA). This EUA will remain in effect  (meaning this test can be used) for the duration of the COVID-19 declaration under Section 564(b)(1) of the Act, 21 U.S.C. section 360bbb-3(b)(1), unless the authorization is terminated or revoked.     Resp Syncytial Virus by PCR NEGATIVE NEGATIVE Final    Comment: (NOTE) Fact Sheet for Patients: BloggerCourse.com  Fact Sheet for Healthcare Providers:  SeriousBroker.it  This test is not yet approved or cleared by the [place]  FDA and has been authorized for detection and/or diagnosis of SARS-CoV-2 by FDA under an Emergency Use Authorization (EUA). This EUA will remain in effect (meaning this test can be used) for the duration of the COVID-19 declaration under Section 564(b)(1) of the Act, 21 U.S.C. section 360bbb-3(b)(1), unless the authorization is terminated or revoked.  Performed at [facility], [address] [place], [place] [address]   MRSA Next Gen by PCR, Nasal     Status: Abnormal   Collection Time: 08/06/24  9:47 PM   Specimen: Nasal Mucosa; Nasal Swab  Result Value Ref Range Status   MRSA by PCR Next Gen DETECTED (A) NOT DETECTED Final    Comment: CRITICAL RESULT CALLED TO, READ BACK BY AND VERIFIED WITH: HAYES S. @ 0551 08/07/2024 MCLEAN K. (NOTE) The GeneXpert MRSA Assay (FDA approved for NASAL specimens only), is one component of a comprehensive MRSA colonization surveillance program. It is not intended to diagnose MRSA infection nor to guide or monitor treatment for MRSA infections. Test performance is not FDA approved in patients less than 34 years old. Performed at [facility], [address], [place], [place] [address]       Radiology Studies: VAS US  LOWER EXTREMITY VENOUS (DVT) Result Date: 08/07/2024  Lower Venous DVT Study Patient Name:  Mandy Adams  Date of Exam:   08/07/2024 Medical Rec #: [id number]         Accession #:    [id number] Date of Birth:  1932-03-17         Patient Gender: F Patient Age:   32 years Exam Location:  [facility] Procedure:      VAS US  LOWER EXTREMITY VENOUS (DVT) Referring Phys: BLEASE DOUTOVA --------------------------------------------------------------------------------  Indications: Pulmonary embolism, SOB, and Left lower extremity pain. Patient discharged to SNF status post hip replacement 08/07/24, then readmitted same day of discharge with shortness of breath. Other Indications: Left hip replacement 08/03/24. CT of chest revealed acute PE                    with right heart strain and large hiatal hernia. Risk Factors: Dementia. Limitations: Pain with compression maneuvers. Comparison Study: No prior study on file Performing Technologist: Alberta Lis RVS  Examination Guidelines: A complete evaluation includes B-mode imaging, spectral Doppler, color Doppler, and power Doppler as needed of all accessible portions of each vessel. Bilateral testing is considered an integral part of a complete examination. Limited examinations for reoccurring indications may be performed as noted. The reflux portion of the exam is performed with the patient in reverse Trendelenburg.  +-----+---------------+---------+-----------+-------------------+--------------+ RIGHTCompressibilityPhasicitySpontaneityProperties         Thrombus Aging +-----+---------------+---------+-----------+-------------------+--------------+ CFV  Full           Yes      No         Pulsatile waveforms               +-----+---------------+---------+-----------+-------------------+--------------+ SFJ  Full                                                                 +-----+---------------+---------+-----------+-------------------+--------------+   +---------+---------------+---------+-----------+---------------+--------------+ LEFT     CompressibilityPhasicitySpontaneityProperties  Thrombus Aging  +---------+---------------+---------+-----------+---------------+--------------+ CFV                     Yes      No         Pulsatile      patent by                                                  waveforms      color and                                                                 Doppler        +---------+---------------+---------+-----------+---------------+--------------+ SFJ                     Yes      No         Pulsatile      patent by                                                  waveforms      color and                                                                 Doppler        +---------+---------------+---------+-----------+---------------+--------------+ FV Prox  Full           Yes      No         Pulsatile                                                                 waveforms                     +---------+---------------+---------+-----------+---------------+--------------+ FV Mid   Full                                                             +---------+---------------+---------+-----------+---------------+--------------+ FV DistalFull           Yes      No         Pulsatile  waveforms                     +---------+---------------+---------+-----------+---------------+--------------+ PFV      Full           Yes      No                                       +---------+---------------+---------+-----------+---------------+--------------+ POP      Full           Yes      No         Pulsatile                                                                 waveforms                     +---------+---------------+---------+-----------+---------------+--------------+ PTV      Full                                                             +---------+---------------+---------+-----------+---------------+--------------+ PERO      Full                                                             +---------+---------------+---------+-----------+---------------+--------------+     Summary: RIGHT: - No evidence of common femoral vein obstruction.   LEFT: - There is no evidence of deep vein thrombosis in the lower extremity.  - No cystic structure found in the popliteal fossa. - Ultrasound characteristics of enlarged lymph nodes noted in the groin. Interstitial edema noted throughout left lower extremity.  *See table(s) above for measurements and observations. Electronically signed by Lonni Gaskins MD on 08/07/2024 at 1:50:33 PM.    Final    ECHOCARDIOGRAM COMPLETE Result Date: 08/07/2024    ECHOCARDIOGRAM REPORT   Patient Name:   Mandy Adams Date of Exam: 08/07/2024 Medical Rec #:  [id number]        Height:       60.0 in Accession #:    [id number]       Weight:       121.5 lb Date of Birth:  09-Jul-1932        BSA:          1.510 m Patient Age:    89 years         BP:           94/40 mmHg Patient Gender: F                HR:           85 bpm. Exam Location:  Inpatient Procedure: 2D Echo, Cardiac Doppler and Color Doppler (Both Spectral and Color            Flow Doppler  were utilized during procedure). Indications:    Pulmonary Embolus I26.09  History:        Patient has no prior history of Echocardiogram examinations.                 Risk Factors:Hypertension. H/O Hyperlipidemia, Dementia,                 Pulmonary Embolism, Shortness of breath.  Sonographer:    BERNARDA ROCKS Referring Phys: [email or web address].Diones ANASTASSIA DOUTOVA IMPRESSIONS  1. Left ventricular ejection fraction, by estimation, is 60 to 65%. The left ventricle has normal function. The left ventricle has no regional wall motion abnormalities. Left ventricular diastolic parameters are consistent with Grade I diastolic dysfunction (impaired relaxation).  2. Pulmonary embolism. Right ventricular systolic function is mildly reduced. The right ventricular size is normal.  3. The  mitral valve is normal in structure. Mild mitral valve regurgitation. No evidence of mitral stenosis.  4. The aortic valve is tricuspid. Aortic valve regurgitation is not visualized. No aortic stenosis is present.  5. The inferior vena cava is normal in size with <50% respiratory variability, suggesting right atrial pressure of 8 mmHg. FINDINGS  Left Ventricle: Left ventricular ejection fraction, by estimation, is 60 to 65%. The left ventricle has normal function. The left ventricle has no regional wall motion abnormalities. The left ventricular internal cavity size was normal in size. There is  no left ventricular hypertrophy. Left ventricular diastolic parameters are consistent with Grade I diastolic dysfunction (impaired relaxation). Right Ventricle: Pulmonary embolism. The right ventricular size is normal. No increase in right ventricular wall thickness. Right ventricular systolic function is mildly reduced. Left Atrium: Left atrial size was normal in size. Right Atrium: Right atrial size was normal in size. Pericardium: There is no evidence of pericardial effusion. Mitral Valve: The mitral valve is normal in structure. Mild mitral valve regurgitation. No evidence of mitral valve stenosis. MV peak gradient, 9.7 mmHg. The mean mitral valve gradient is 4.0 mmHg. Tricuspid Valve: The tricuspid valve is normal in structure. Tricuspid valve regurgitation is not demonstrated. No evidence of tricuspid stenosis. Aortic Valve: The aortic valve is tricuspid. Aortic valve regurgitation is not visualized. No aortic stenosis is present. Aortic valve mean gradient measures 3.0 mmHg. Aortic valve peak gradient measures 5.3 mmHg. Aortic valve area, by VTI measures 2.47 cm. Pulmonic Valve: The pulmonic valve was normal in structure. Pulmonic valve regurgitation is not visualized. No evidence of pulmonic stenosis. Aorta: The aortic root is normal in size and structure. Venous: The inferior vena cava is normal in size with less  than 50% respiratory variability, suggesting right atrial pressure of 8 mmHg. IAS/Shunts: No atrial level shunt detected by color flow Doppler.  LEFT VENTRICLE PLAX 2D LVIDd:         3.40 cm     Diastology LVIDs:         2.40 cm     LV e' medial:    2.72 cm/s LV PW:         0.70 cm     LV E/e' medial:  28.3 LV IVS:        0.90 cm     LV e' lateral:   6.85 cm/s LVOT diam:     1.70 cm     LV E/e' lateral: 11.3 LV SV:         53 LV SV Index:   35 LVOT Area:     2.27 cm  LV Volumes (MOD) LV vol d, MOD A2C: 62.4  ml LV vol d, MOD A4C: 57.9 ml LV vol s, MOD A2C: 15.3 ml LV vol s, MOD A4C: 21.4 ml LV SV MOD A2C:     47.1 ml LV SV MOD A4C:     57.9 ml LV SV MOD BP:      43.5 ml RIGHT VENTRICLE            IVC RV Basal diam:  2.90 cm    IVC diam: 1.80 cm RV S prime:     8.59 cm/s TAPSE (M-mode): 1.3 cm LEFT ATRIUM             Index        RIGHT ATRIUM          Index LA diam:        2.70 cm 1.79 cm/m   RA Area:     9.54 cm LA Vol (A2C):   30.4 ml 20.13 ml/m  RA Volume:   19.80 ml 13.11 ml/m LA Vol (A4C):   33.4 ml 22.12 ml/m LA Biplane Vol: 33.5 ml 22.18 ml/m  AORTIC VALVE                    PULMONIC VALVE AV Area (Vmax):    2.51 cm     PV Vmax:          0.68 m/s AV Area (Vmean):   2.16 cm     PV Peak grad:     1.9 mmHg AV Area (VTI):     2.47 cm     PR End Diast Vel: 0.82 msec AV Vmax:           115.00 cm/s AV Vmean:          72.500 cm/s AV VTI:            0.216 m AV Peak Grad:      5.3 mmHg AV Mean Grad:      3.0 mmHg LVOT Vmax:         127.00 cm/s LVOT Vmean:        69.100 cm/s LVOT VTI:          0.235 m LVOT/AV VTI ratio: 1.09  AORTA Ao Root diam: 2.90 cm Ao Asc diam:  3.10 cm MITRAL VALVE MV Area (PHT): 5.42 cm     SHUNTS MV Area VTI:   1.74 cm     Systemic VTI:  0.24 m MV Peak grad:  9.7 mmHg     Systemic Diam: 1.70 cm MV Mean grad:  4.0 mmHg MV Vmax:       1.56 m/s MV Vmean:      91.1 cm/s MV Decel Time: 140 msec MV E velocity: 77.10 cm/s MV A velocity: 148.00 cm/s MV E/A ratio:  0.52 Oneil Parchment MD  Electronically signed by Oneil Parchment MD Signature Date/Time: 08/07/2024/10:53:37 AM    Final    CT Angio Chest PE W and/or Wo Contrast Result Date: 08/06/2024 CLINICAL DATA:  Pulmonary embolism (PE) suspected, high prob EXAM: CT ANGIOGRAPHY CHEST WITH CONTRAST TECHNIQUE: Multidetector CT imaging of the chest was performed using the standard protocol during bolus administration of intravenous contrast. Multiplanar CT image reconstructions and MIPs were obtained to evaluate the vascular anatomy. RADIATION DOSE REDUCTION: This exam was performed according to the departmental dose-optimization program which includes automated exposure control, adjustment of the mA and/or kV according to patient size and/or use of iterative reconstruction technique. CONTRAST:  75mL OMNIPAQUE  IOHEXOL  350 MG/ML SOLN COMPARISON:  08/03/2024 FINDINGS: Cardiovascular:  Pulmonary emboli noted in the right upper lobe, pulmonary emboli noted in all lobes of the right lung. No emboli seen on the left. Evidence of right heart strain with RV to LV ratio of 1.15. No evidence of aortic aneurysm. Coronary artery and aortic atherosclerosis. Mediastinum/Nodes: No mediastinal, hilar, or axillary adenopathy. Trachea and esophagus are unremarkable. Thyroid unremarkable. Very large hiatal hernia containing much of the stomach and transverse colon. Lungs/Pleura: Large bilateral pleural effusions. Compressive atelectasis in the lower lobes bilaterally. Upper Abdomen: No acute findings Musculoskeletal: Chest wall soft tissues are unremarkable. No acute bony abnormality. Review of the MIP images confirms the above findings. IMPRESSION: Pulmonary emboli within all lobes of the right lung. CT evidence of right heart strain (RV/LV Ratio = 1.15) consistent with at least submassive (intermediate risk) PE. The presence of right heart strain has been associated with an increased risk of morbidity and mortality. Please refer to the Code PE Focused order set in EPIC.  Large hiatal hernia containing most of the stomach and transverse colon. Large bilateral pleural effusions. Compressive atelectasis in the lower lobes. Aortic Atherosclerosis (ICD10-I70.0). These results were called by telephone at the time of interpretation on 08/06/2024 at 7:32 pm to provider HAYLEY NAASZ , who verbally acknowledged these results. Electronically Signed   By: Franky Crease M.D.   On: 08/06/2024 19:36       LOS: 1 day   Joette Pebbles  [facility] Pager on [email or web address]  08/08/2024, 9:11 AM

## 2024-08-08 NOTE — Consult Note (Signed)
 Consultation Note Date: 08/08/2024   Patient Name: Mandy Adams  DOB: 09-25-1932  MRN: [id number]  Age / Sex: 89 y.o., female   PCP: Patient, No Pcp Per Referring Physician: Verdene Purchase, MD  Reason for Consultation: Establishing goals of care     Chief Complaint/History of Present Illness:   Patient is a 89 year old female with a past medical history of recent left hip fracture status post surgery, CKD stage III AA, dementia, hyperlipidemia, hypertension, leukocytosis, GERD, eczema, and aortic atherosclerosis who was admitted on 08/06/2024 from nursing facility for management of shortness of breath and was noted to be cyanotic.  Patient had been discharged to SNF for rehab the same day she was readmitted after she had undergone surgical intervention for her left hip fracture after a fall.  Since being admitted, patient has received management for newly diagnosed acute pulmonary embolism, acute respiratory failure with hypoxia, and bilateral pleural effusions.  Bowel medicine team consulted to assist with complex medical decision making.  Extensive review of EMR including recent documentation from [facility] provider and hospitalist.  Recent BMP noted creatinine has down trended to 1.03 with estimated GFR of 51.  Reviewed I's and O's and noted patient having minimal oral intake.  Personally reviewed recent CT noting bilateral pleural effusions. Discussed care with bedside RN for medical updates.  Presented to bedside to see patient.  Patient resting comfortably in the bed though easily awakened.  Patient confused.  No visitors present at bedside.  ------------------------------------------------------------------------------------------------------------- Advance Care Planning Conversation  Pertinent diagnosis: Recent left hip fracture s/p surgery, dementia, delirium, acute hypoxic respiratory failure in setting of bilateral pleural effusions, acute pulmonary embolism, debility  The patient  and/or family consented to a voluntary Advance Care Planning Conversation in person/over the phone. Individuals present for the conversation: This provider discussed care with patient's daughter and son.  Summary of the conversation:  Able to call and speak with patient's daughter, Mandy Adams.  Introduced myself as a member of the palliative medicine team my role in patient's medical journey.  Mandy noted that she and her brother would greatly appreciate meeting to discuss patient's care moving forward.  They would both be able to come in this afternoon.  Noted would plan to meet them at 1 PM to discuss pathways for medical care moving forward.  Presented to bedside at 1 PM.  Patient awake pleasantly.  Used interacting with children at bedside.  Able to introduce myself as a member of the palliative medicine team marvel patient's medical journey.  As to not overwhelm patient with her underlying confusion in setting of dementia, able to speak to children separately in private area. Spent time reviewing about patient's functional status and quality of life prior to presenting to the hospital.  Family able to update regarding patient's rapid deterioration and worsening since fall.  Discussed patient's dementia had been worsening prior to fall.  Patient was receiving care at [facility] prior to fall.  Patient had been sent to rehab though quickly worsened same day. Able discussed hopes for medical care moving forward.  Family noting that they want patient to enjoy time she has.  They do not want patient to undergo aggressive medical interventions.  With permission, able to discuss pathways for medical care moving forward.  Discussed that continuing with current pathway would focus on patient returning to rehab.  Did express concern that with patient's underlying dementia and worsening confusion and minimal oral intake, may be quickly returned back to hospital.  Also concerned  about patient's ability to  participate with physical therapy with her underlying mental status.  Family acknowledging this.  Discussed alternative pathway focusing on patient's comfort at this time.  Discussed discontinuing interventions that are no longer focused on comfort such as lab work and IV fluids.  Discussed providing medications for pain or if other symptoms arise.  Discussed that based on patient's current status, would anticipate with this pathway patient would need long-term care support at a facility with hospice layered in.  Noted should patient deteriorate, could consider inpatient hospice.  After discussion and answering questions regarding pathways, family electing to pursue transition to comfort focused care at this time.  Daughter planning to reach out to patient's previous facility to assist with coordination of long-term care placement with hospice layered in.  Noted continuing open discussions of patient does deteriorate, would then seek inpatient hospice.  All questions answered at that time.  Noted palliative medicine team will continue to follow with patient's medical journey.  Outcome of the conversations and/or documents completed:  Transition to full comfort focused care at this time.  At this time seeking long-term care placement with hospice support.  If acutely decompensates, consider inpatient hospice evaluation.  I spent 50 minutes providing separately identifiable ACP services with the patient and/or surrogate decision maker in a voluntary, in-person conversation discussing the patient's wishes and goals as detailed in the above note.  Mandy Radar, DO Palliative Medicine Provider  -------------------------------------------------------------------------------------------------------------  Discussed care with hospitalist, RN, and TOC about transition to comfort focused care at this time.  Primary Diagnoses  Present on Admission:  Essential hypertension  Stage 3 chronic kidney disease  (HCC)  Acute pulmonary embolism (HCC)  Pulmonary embolism (HCC)  Gastro-esophageal reflux disease without esophagitis  Moderate dementia (HCC)  Anemia  Acute respiratory failure with hypoxia (HCC)  Past Medical History:  Diagnosis Date   AKI (acute kidney injury) 04/30/2018   Community acquired pneumonia 04/30/2018   Dementia (HCC)    Hyperlipidemia    Hypertension    Leukocytosis 04/30/2018   Social History   Socioeconomic History   Marital status: Married    Spouse name: Not on file   Number of children: Not on file   Years of education: Not on file   Highest education level: Not on file  Occupational History   Not on file  Tobacco Use   Smoking status: Never   Smokeless tobacco: Never  Vaping Use   Vaping status: Never Used  Substance and Sexual Activity   Alcohol  use: Never   Drug use: Never   Sexual activity: Not on file  Other Topics Concern   Not on file  Social History Narrative   Not on file   Social Drivers of Health   Financial Resource Strain: Not on file  Food Insecurity: Not on file  Transportation Needs: Patient Unable To Answer (08/06/2024)   PRAPARE - Transportation    Lack of Transportation (Medical): Patient unable to answer    Lack of Transportation (Non-Medical): Patient unable to answer  Physical Activity: Not on file  Stress: Not on file  Social Connections: Patient Unable To Answer (08/06/2024)   Social Connection and Isolation Panel    Frequency of Communication with Friends and Family: Patient unable to answer    Frequency of Social Gatherings with Friends and Family: Patient unable to answer    Attends Religious Services: Patient unable to answer    Active Member of Clubs or Organizations: Patient unable to answer  Attends Banker Meetings: Patient unable to answer    Marital Status: Patient unable to answer   Family History  Problem Relation Age of Onset   Heart failure Father    Scheduled Meds:  apixaban   10 mg  Oral BID   Followed by   NOREEN ON 08/14/2024] apixaban   5 mg Oral BID   Chlorhexidine  Gluconate Cloth  6 each Topical Daily   Chlorhexidine  Gluconate Cloth  6 each Topical Daily   memantine   10 mg Oral QHS   mirtazapine   15 mg Oral QHS   mupirocin  ointment  1 Application Nasal BID   pantoprazole   20 mg Oral Daily   sertraline   50 mg Oral Daily   Continuous Infusions: PRN Meds:.acetaminophen  **OR** acetaminophen , HYDROcodone -acetaminophen , meclizine , ondansetron  **OR** ondansetron  (ZOFRAN ) IV, mouth rinse, polyethylene glycol, sodium chloride  flush No Known Allergies CBC:    Component Value Date/Time   WBC 7.7 08/08/2024 0603   HGB 9.1 (L) 08/08/2024 0603   HCT 29.7 (L) 08/08/2024 0603   PLT 244 08/08/2024 0603   MCV 92.2 08/08/2024 0603   NEUTROABS 8.9 (H) 08/06/2024 1545   LYMPHSABS 1.2 08/06/2024 1545   MONOABS 0.9 08/06/2024 1545   EOSABS 0.3 08/06/2024 1545   BASOSABS 0.1 08/06/2024 1545   Comprehensive Metabolic Panel:    Component Value Date/Time   NA 141 08/07/2024 0227   K 4.2 08/07/2024 0227   CL 113 (H) 08/07/2024 0227   CO2 20 (L) 08/07/2024 0227   BUN 29 (H) 08/07/2024 0227   CREATININE 1.20 (H) 08/07/2024 0227   GLUCOSE 90 08/07/2024 0227   CALCIUM  8.1 (L) 08/07/2024 0227   AST 65 (H) 08/07/2024 0227   ALT 6 08/07/2024 0227   ALKPHOS 73 08/07/2024 0227   BILITOT 0.5 08/07/2024 0227   PROT 5.5 (L) 08/07/2024 0227   ALBUMIN  3.2 (L) 08/07/2024 0227    Physical Exam: Vital Signs: BP 133/87 (BP Location: Right Arm)   Pulse 88   Temp 97.8 F (36.6 C) (Oral)   Resp 18   Ht 5' (1.524 m)   Wt 55.1 kg   SpO2 100%   BMI 23.72 kg/m  SpO2: SpO2: 100 % O2 Device: O2 Device: Nasal Cannula O2 Flow Rate: O2 Flow Rate (L/min): 2 L/min Intake/output summary:  Intake/Output Summary (Last 24 hours) at 08/08/2024 0757 Last data filed at 08/08/2024 0645 Gross per 24 hour  Intake 574.68 ml  Output 1500 ml  Net -925.32 ml   LBM: Last BM Date : 08/05/24 Baseline  Weight: Weight: 55.1 kg Most recent weight: Weight: 55.1 kg  General: NAD, awake, pleasantly confused, chronically ill-appearing, frail Cardiovascular: RRR Respiratory: no increased work of breathing noted, not in respiratory distress Neuro: Pleasantly confused          Palliative Performance Scale: 30%              Additional Data Reviewed: Recent Labs    08/06/24 1545 08/07/24 0227 08/08/24 0603  WBC 11.6* 11.4* 7.7  HGB 9.7* 9.1* 9.1*  PLT PLATELET CLUMPS NOTED ON SMEAR, UNABLE TO ESTIMATE 239 244  NA 140 141  --   BUN 34* 29*  --   CREATININE 1.24* 1.20*  --     Imaging: VAS US  LOWER EXTREMITY VENOUS (DVT)  Lower Venous DVT Study  Patient Name:  ZYKERIAH MATHIA  Date of Exam:   08/07/2024 Medical Rec #: [id number]         Accession #:    [id number] Date of Birth: 1932/05/04  Patient Gender: F Patient Age:   55 years Exam Location:  [facility] Procedure:      VAS US  LOWER EXTREMITY VENOUS (DVT) Referring Phys: BLEASE DOUTOVA  --------------------------------------------------------------------------------   Indications: Pulmonary embolism, SOB, and Left lower extremity pain. Patient discharged to SNF status post hip replacement 08/07/24, then readmitted same day of discharge with shortness of breath. Other Indications: Left hip replacement 08/03/24. CT of chest revealed acute PE                    with right heart strain and large hiatal hernia.  Risk Factors: Dementia. Limitations: Pain with compression maneuvers. Comparison Study: No prior study on file  Performing Technologist: Alberta Lis RVS    Examination Guidelines: A complete evaluation includes B-mode imaging, spectral Doppler, color Doppler, and power Doppler as needed of all accessible portions of each vessel. Bilateral testing is considered an integral part of a complete examination. Limited examinations for reoccurring indications may be performed as noted. The reflux portion  of the exam is performed with the patient in reverse Trendelenburg.     +-----+---------------+---------+-----------+-------------------+--------------+ RIGHTCompressibilityPhasicitySpontaneityProperties         Thrombus Aging +-----+---------------+---------+-----------+-------------------+--------------+ CFV  Full           Yes      No         Pulsatile waveforms               +-----+---------------+---------+-----------+-------------------+--------------+ SFJ  Full                                                                 +-----+---------------+---------+-----------+-------------------+--------------+        +---------+---------------+---------+-----------+---------------+--------------+ LEFT     CompressibilityPhasicitySpontaneityProperties     Thrombus Aging +---------+---------------+---------+-----------+---------------+--------------+ CFV                     Yes      No         Pulsatile      patent by                                                  waveforms      color and                                                                 Doppler        +---------+---------------+---------+-----------+---------------+--------------+ SFJ                     Yes      No         Pulsatile      patent by  waveforms      color and                                                                 Doppler        +---------+---------------+---------+-----------+---------------+--------------+ FV Prox  Full           Yes      No         Pulsatile                                                                 waveforms                     +---------+---------------+---------+-----------+---------------+--------------+ FV Mid   Full                                                              +---------+---------------+---------+-----------+---------------+--------------+ FV DistalFull           Yes      No         Pulsatile                                                                 waveforms                     +---------+---------------+---------+-----------+---------------+--------------+ PFV      Full           Yes      No                                       +---------+---------------+---------+-----------+---------------+--------------+ POP      Full           Yes      No         Pulsatile                                                                 waveforms                     +---------+---------------+---------+-----------+---------------+--------------+ PTV      Full                                                             +---------+---------------+---------+-----------+---------------+--------------+  PERO     Full                                                             +---------+---------------+---------+-----------+---------------+--------------+             Summary: RIGHT: - No evidence of common femoral vein obstruction.      LEFT:  - There is no evidence of deep vein thrombosis in the lower extremity.   - No cystic structure found in the popliteal fossa. - Ultrasound characteristics of enlarged lymph nodes noted in the groin. Interstitial edema noted throughout left lower extremity.    *See table(s) above for measurements and observations.  Electronically signed by Lonni Gaskins MD on 08/07/2024 at 1:50:33 PM.      Final   ECHOCARDIOGRAM COMPLETE    ECHOCARDIOGRAM REPORT       Patient Name:   BRAXTYN DORFF Date of Exam: 08/07/2024 Medical Rec #:  [id number]        Height:       60.0 in Accession #:    [id number]       Weight:       121.5 lb Date of Birth:  1932/04/06        BSA:          1.510 m Patient Age:    89 years         BP:           94/40 mmHg Patient  Gender: F                HR:           85 bpm. Exam Location:  Inpatient  Procedure: 2D Echo, Cardiac Doppler and Color Doppler (Both Spectral and Color            Flow Doppler were utilized during procedure).  Indications:    Pulmonary Embolus I26.09   History:        Patient has no prior history of Echocardiogram examinations.                 Risk Factors:Hypertension. H/O Hyperlipidemia, Dementia,                 Pulmonary Embolism, Shortness of breath.   Sonographer:    BERNARDA ROCKS Referring Phys: Domingo.Diones ANASTASSIA DOUTOVA  IMPRESSIONS   1. Left ventricular ejection fraction, by estimation, is 60 to 65%. The left ventricle has normal function. The left ventricle has no regional wall motion abnormalities. Left ventricular diastolic parameters are consistent with Grade I diastolic  dysfunction (impaired relaxation).  2. Pulmonary embolism. Right ventricular systolic function is mildly reduced. The right ventricular size is normal.  3. The mitral valve is normal in structure. Mild mitral valve regurgitation. No evidence of mitral stenosis.  4. The aortic valve is tricuspid. Aortic valve regurgitation is not visualized. No aortic stenosis is present.  5. The inferior vena cava is normal in size with <50% respiratory variability, suggesting right atrial pressure of 8 mmHg.  FINDINGS  Left Ventricle: Left ventricular ejection fraction, by estimation, is 60 to 65%. The left ventricle has normal function. The left ventricle has no regional wall motion abnormalities. The left ventricular internal cavity size was normal in size. There is  no left ventricular hypertrophy. Left ventricular diastolic  parameters are consistent with Grade I diastolic dysfunction (impaired relaxation).  Right Ventricle: Pulmonary embolism. The right ventricular size is normal. No increase in right ventricular wall thickness. Right ventricular systolic function is mildly reduced.  Left Atrium: Left atrial size was  normal in size.  Right Atrium: Right atrial size was normal in size.  Pericardium: There is no evidence of pericardial effusion.  Mitral Valve: The mitral valve is normal in structure. Mild mitral valve regurgitation. No evidence of mitral valve stenosis. MV peak gradient, 9.7 mmHg. The mean mitral valve gradient is 4.0 mmHg.  Tricuspid Valve: The tricuspid valve is normal in structure. Tricuspid valve regurgitation is not demonstrated. No evidence of tricuspid stenosis.  Aortic Valve: The aortic valve is tricuspid. Aortic valve regurgitation is not visualized. No aortic stenosis is present. Aortic valve mean gradient measures 3.0 mmHg. Aortic valve peak gradient measures 5.3 mmHg. Aortic valve area, by VTI measures 2.47  cm.  Pulmonic Valve: The pulmonic valve was normal in structure. Pulmonic valve regurgitation is not visualized. No evidence of pulmonic stenosis.  Aorta: The aortic root is normal in size and structure.  Venous: The inferior vena cava is normal in size with less than 50% respiratory variability, suggesting right atrial pressure of 8 mmHg.  IAS/Shunts: No atrial level shunt detected by color flow Doppler.    LEFT VENTRICLE PLAX 2D LVIDd:         3.40 cm     Diastology LVIDs:         2.40 cm     LV e' medial:    2.72 cm/s LV PW:         0.70 cm     LV E/e' medial:  28.3 LV IVS:        0.90 cm     LV e' lateral:   6.85 cm/s LVOT diam:     1.70 cm     LV E/e' lateral: 11.3 LV SV:         53 LV SV Index:   35 LVOT Area:     2.27 cm   LV Volumes (MOD) LV vol d, MOD A2C: 62.4 ml LV vol d, MOD A4C: 57.9 ml LV vol s, MOD A2C: 15.3 ml LV vol s, MOD A4C: 21.4 ml LV SV MOD A2C:     47.1 ml LV SV MOD A4C:     57.9 ml LV SV MOD BP:      43.5 ml  RIGHT VENTRICLE            IVC RV Basal diam:  2.90 cm    IVC diam: 1.80 cm RV S prime:     8.59 cm/s TAPSE (M-mode): 1.3 cm  LEFT ATRIUM             Index        RIGHT ATRIUM          Index LA diam:        2.70 cm 1.79  cm/m   RA Area:     9.54 cm LA Vol (A2C):   30.4 ml 20.13 ml/m  RA Volume:   19.80 ml 13.11 ml/m LA Vol (A4C):   33.4 ml 22.12 ml/m LA Biplane Vol: 33.5 ml 22.18 ml/m  AORTIC VALVE                    PULMONIC VALVE AV Area (Vmax):    2.51 cm     PV Vmax:  0.68 m/s AV Area (Vmean):   2.16 cm     PV Peak grad:     1.9 mmHg AV Area (VTI):     2.47 cm     PR End Diast Vel: 0.82 msec AV Vmax:           115.00 cm/s AV Vmean:          72.500 cm/s AV VTI:            0.216 m AV Peak Grad:      5.3 mmHg AV Mean Grad:      3.0 mmHg LVOT Vmax:         127.00 cm/s LVOT Vmean:        69.100 cm/s LVOT VTI:          0.235 m LVOT/AV VTI ratio: 1.09   AORTA Ao Root diam: 2.90 cm Ao Asc diam:  3.10 cm  MITRAL VALVE MV Area (PHT): 5.42 cm     SHUNTS MV Area VTI:   1.74 cm     Systemic VTI:  0.24 m MV Peak grad:  9.7 mmHg     Systemic Diam: 1.70 cm MV Mean grad:  4.0 mmHg MV Vmax:       1.56 m/s MV Vmean:      91.1 cm/s MV Decel Time: 140 msec MV E velocity: 77.10 cm/s MV A velocity: 148.00 cm/s MV E/A ratio:  0.52  Oneil Parchment MD Electronically signed by Oneil Parchment MD Signature Date/Time: 08/07/2024/10:53:37 AM      Final      I personally reviewed recent imaging.   Palliative Care Assessment and Plan Summary of Established Goals of Care and Medical Treatment Preferences    Patient is a 89 year old female with a past medical history of recent left hip fracture status post surgery, CKD stage III AA, dementia, hyperlipidemia, hypertension, leukocytosis, GERD, eczema, and aortic atherosclerosis who was admitted on 08/06/2024 from nursing facility for management of shortness of breath and was noted to be cyanotic.  Patient had been discharged to SNF for rehab the same day she was readmitted after she had undergone surgical intervention for her left hip fracture after a fall.  Since being admitted, patient has received management for newly diagnosed acute pulmonary embolism,  acute respiratory failure with hypoxia, and bilateral pleural effusions.  Bowel medicine team consulted to assist with complex medical decision making.  # Complex medical decision making/goals of care  # Complex medical decision making/goals of care  -Patient unable to participate in medical decision making secondary to medical status.  -Spoke with patient's son and daughter.  Discussed pathways for medical care moving forward including aggressive medical interventions and seeking rehab versus transition to comfort focused care and seeking hospice support.  Family electing to transition to comfort focused care at this time.  Daughter reaching out to current facility with hope can assist with coordinating getting patient to long-term care with hospice support.  Discussed should patient acutely deteriorate in the hospital, would then consider inpatient hospice evaluation.  Palliative medicine team continuing to follow along to engage in conversations as able and appropriate.  -At this time we will discontinue interventions that are no longer focused on comfort such as IV fluids, imaging, or lab work.  Will instead focus on symptom management of pain, dyspnea, and agitation in the setting of end-of-life care.    Code Status: Do not attempt resuscitation (DNR) - Comfort care  # Symptom management Patient is receiving these palliative interventions  for symptom management with an intent to improve quality of life.     -Pain/Dyspnea, acute in the setting of end-of-life care                Patient was not on medications for pain previously.                               -Start oral oxycodone  5 mg every 2 hours as needed   - Start IV Dilaudid  0.5 mg every 2 hours as needed only for breakthrough pain or shortness of breath after oral opioids                  -Anxiety/agitation, in the setting of end-of-life care                               -Start Haldol  as needed                  -Secretions, in the  setting of end-of-life care                               -Start glycopyrrolate as needed  # Psycho-social/Spiritual Support:  - Support System: Daughter, son  # Discharge Planning:  Skilled Nursing Facility with Hospice  Thank you for allowing the palliative care team to participate in the care Charolette Garret.  Mandy Radar, DO Palliative Care Provider PMT # [phone]  If patient remains symptomatic despite maximum doses, please call PMT at [phone] between 0700 and 1900. Outside of these hours, please call attending, as PMT does not have night coverage.  Billing based on MDM: High  Problems Addressed: One or more chronic illnesses with severe exacerbation, progression, or side effects of treatment.  Risks: Parenteral controlled substances

## 2024-08-09 DIAGNOSIS — J9601 Acute respiratory failure with hypoxia: Secondary | ICD-10-CM | POA: Diagnosis not present

## 2024-08-09 DIAGNOSIS — Z79899 Other long term (current) drug therapy: Secondary | ICD-10-CM | POA: Diagnosis not present

## 2024-08-09 DIAGNOSIS — Z7189 Other specified counseling: Secondary | ICD-10-CM

## 2024-08-09 DIAGNOSIS — J90 Pleural effusion, not elsewhere classified: Secondary | ICD-10-CM | POA: Diagnosis not present

## 2024-08-09 DIAGNOSIS — I2609 Other pulmonary embolism with acute cor pulmonale: Secondary | ICD-10-CM | POA: Diagnosis not present

## 2024-08-09 DIAGNOSIS — Z515 Encounter for palliative care: Secondary | ICD-10-CM | POA: Diagnosis not present

## 2024-08-09 DIAGNOSIS — F0390 Unspecified dementia without behavioral disturbance: Secondary | ICD-10-CM | POA: Diagnosis not present

## 2024-08-09 NOTE — Plan of Care (Signed)
  Problem: Clinical Measurements: Goal: Respiratory complications will improve Outcome: Progressing   Problem: Coping: Goal: Level of anxiety will decrease Outcome: Progressing   Problem: Elimination: Goal: Will not experience complications related to bowel motility Outcome: Progressing Goal: Will not experience complications related to urinary retention Outcome: Progressing   Problem: Pain Managment: Goal: General experience of comfort will improve and/or be controlled Outcome: Progressing   Problem: Safety: Goal: Ability to remain free from injury will improve Outcome: Progressing   Problem: Skin Integrity: Goal: Risk for impaired skin integrity will decrease Outcome: Progressing   Problem: Role Relationship: Goal: Family's ability to cope with current situation will improve Outcome: Progressing   Problem: Pain Management: Goal: Satisfaction with pain management regimen will improve Outcome: Progressing

## 2024-08-09 NOTE — Progress Notes (Signed)
 [facility] PROGRESS NOTE   Mandy Adams FMW:[id number] DOB: Sep 20, 1932 DOA: 08/06/2024  PCP: Patient, No Pcp Per  Brief History: 89 y.o. female with medical history significant of AKI, CAP, dementia, hyperlipidemia, hypertension, leukocytosis, GERD, eczema, aortic atherosclerosis, stage IIIa CKD who was hospitalized a few days ago after a fall resulting in left hip fracture.  She underwent surgery.  She also developed acute kidney injury due to dehydration.  Was also transfused PRBC during previous hospitalization.  She was stabilized and then discharged to SNF.  Within a few hours after arriving at SNF she started getting short of breath and was noted to be cyanotic.  She was brought back to the hospital and found to have acute pulmonary embolism  Consultants: None  Procedures: Echocardiogram.  Lower extremity Doppler    Subjective/Interval History: Patient is sitting up on the bed eating her breakfast.  Her son is at the bedside.  Patient appears to be in good spirits.  Noted to have some sundowning last night but pleasant this morning.     Assessment/Plan:  Acute pulmonary embolism/acute respiratory failure with hypoxia This is in the setting of recent hip fracture.  She was on aspirin  twice a day for DVT prophylaxis after her recent surgery. CT angiogram showed PE.  Case was discussed with pulmonology who did not recommend thrombolytics.  Patient was also seen by pulmonology. Echocardiogram shows normal LVEF.  Right ventricular function is mildly reduced. No DVT noted on Doppler studies. Patient was transition from IV heparin  to Eliquis .  Aspirin  discontinued. Has been weaned off of oxygen.  Continue to monitor closely.  Bilateral pleural effusion/possible volume overload Elevated BNP and mildly elevated troponin levels also noted.   Large bilateral pleural effusions noted on CT scan.  Could have some degree of volume overload.  Echocardiogram shows normal LVEF.  She  is hemodynamically stable. Continue with furosemide  mainly for palliative purposes.  Family does not want aggressive care.  Acute encephalopathy in the setting of dementia Likely multifactorial including acute illness as well as hospital stay.  Improved with Haldol .  Continue to monitor.  Reorient daily. Continue Namenda .  Recent left hip fracture status post surgery on 9/22 Occasional pain in the left lower extremity.  Belies as tolerated.  Chronic kidney disease stage IIIa/metabolic acidosis Had acute kidney injury during previous hospitalization but creatinine is now back to baseline.  Normocytic anemia Recent acute blood loss anemia for which she was transfused.  Hemoglobin stable.  Recently done anemia panel reviewed.  B12 level was noted to be low normal.  All nonessential medications have been discontinued.  History of essential hypertension/recent hypotension She was hypotensive during previous hospitalization which improved after blood transfusion.  She was on metoprolol recently but it was discontinued by her outpatient providers.  Blood pressures are stable.  Leukocytosis Likely reactive.  Resolved.  L1 vertebral body compression fracture Age-indeterminate L1 compression fracture was noted incidentally on recent CT scan.  She has been asymptomatic.  Sacral decubitus stage II right buttock Wound care.  Goals of Care Discussed with son. He and his sister request a palliative consult as they state that the patient would not want such aggressive care. They want to de-escalate care.   Patient was seen by palliative care.  Plan is for transition to hospice.  Disposition is not clear yet.  Continue with current medications for now.    DVT Prophylaxis: Apixaban   Code Status: DNR Family Communication: Discussed with son at bedside Disposition Plan: Disposition not clear  as patient would benefit from being under hospice services.  TOC should be able to assist  tomorrow.   Medications: Scheduled:  apixaban   10 mg Oral BID   Followed by   NOREEN ON 08/14/2024] apixaban   5 mg Oral BID   furosemide   20 mg Intravenous Daily   memantine   10 mg Oral QHS   mirtazapine   15 mg Oral QHS   mupirocin  ointment  1 Application Nasal BID   pantoprazole   20 mg Oral Daily   sertraline   50 mg Oral Daily   Continuous:   PRN:acetaminophen  **OR** acetaminophen , antiseptic oral rinse, artificial tears, glycopyrrolate **OR** glycopyrrolate **OR** glycopyrrolate, haloperidol  **OR** haloperidol  **OR** haloperidol  lactate, HYDROmorphone  (DILAUDID ) injection, meclizine , ondansetron  **OR** ondansetron  (ZOFRAN ) IV, mouth rinse, oxyCODONE  **OR** oxyCODONE , polyethylene glycol, sodium chloride  flush   Objective:  Vital Signs  Vitals:   08/08/24 0506 08/08/24 1337 08/08/24 2007 08/09/24 0438  BP: 133/87 133/72 (!) 164/85 (!) 145/78  Pulse: 88 95 (!) 109 90  Resp: 18 20 18 18   Temp: 97.8 F (36.6 C) 98.3 F (36.8 C) 99.1 F (37.3 C) 98.6 F (37 C)  TempSrc: Oral Oral Oral Oral  SpO2: 100% 100% 95% 97%  Weight:      Height:        Intake/Output Summary (Last 24 hours) at 08/09/2024 0940 Last data filed at 08/08/2024 2055 Gross per 24 hour  Intake 360 ml  Output 900 ml  Net -540 ml   Filed Weights   08/06/24 2130  Weight: 55.1 kg    General appearance: Awake alert.  In no distress.  Pleasantly confused Resp: Clear to auscultation bilaterally.  Normal effort Cardio: S1-S2 is normal regular.  No S3-S4.  No rubs murmurs or bruit GI: Abdomen is soft.  Nontender nondistended.  Bowel sounds are present normal.  No masses organomegaly  Lab Results:  Data Reviewed: I have personally reviewed following labs and reports of the imaging studies  CBC: Recent Labs  Lab 08/05/24 0343 08/05/24 1534 08/06/24 0332 08/06/24 1538 08/06/24 1545 08/07/24 0227 08/08/24 0603  WBC 10.6*  --  12.4*  --  11.6* 11.4* 7.7  NEUTROABS  --   --   --   --  8.9*  --   --    HGB 7.3*   < > 9.1* 9.9* 9.7* 9.1* 9.1*  HCT 25.0*   < > 30.3* 29.0* 31.5* 29.9* 29.7*  MCV 90.6  --  89.6  --  89.2 89.0 92.2  PLT 208  --  190  --  PLATELET CLUMPS NOTED ON SMEAR, UNABLE TO ESTIMATE 239 244   < > = values in this interval not displayed.    Basic Metabolic Panel: Recent Labs  Lab 08/03/24 0937 08/03/24 1848 08/05/24 0343 08/06/24 0332 08/06/24 1538 08/06/24 1545 08/06/24 2302 08/07/24 0227 08/08/24 0603  NA 141   < > 137 141 142 140  --  141 139  K 4.1   < > 4.1 4.5 3.9 4.0  --  4.2 4.4  CL 108   < > 109 113* 113* 113*  --  113* 110  CO2 18*   < > 16* 18*  --  18*  --  20* 19*  GLUCOSE 102*   < > 156* 117* 121* 124*  --  90 70  BUN 30*   < > 39* 39* 32* 34*  --  29* 20  CREATININE 1.23*   < > 1.67* 1.41* 1.30* 1.24*  --  1.20* 1.03*  CALCIUM   8.3*   < > 7.8* 8.3*  --  8.3*  --  8.1* 8.2*  MG 2.1  --   --   --   --   --  2.4 2.3 2.4  PHOS  --   --   --   --   --   --  1.9* 1.8* 2.5   < > = values in this interval not displayed.    GFR: Estimated Creatinine Clearance: 28.3 mL/min (A) (by C-G formula based on SCr of 1.03 mg/dL (H)).  Liver Function Tests: Recent Labs  Lab 08/03/24 0937 08/07/24 0227  AST 27 65*  ALT 11 6  ALKPHOS 75 73  BILITOT 0.5 0.5  PROT 6.0* 5.5*  ALBUMIN  3.4* 3.2*    Recent Labs  Lab 08/03/24 0937  LIPASE 14   Cardiac Enzymes: Recent Labs  Lab 08/03/24 0937 08/06/24 2302 08/07/24 0227  CKTOTAL 63 882* 932*    BNP (last 3 results) Recent Labs    08/06/24 1545  PROBNP 22,337.0*   CBG: Recent Labs  Lab 08/03/24 0919  GLUCAP 105*    Recent Results (from the past 240 hours)  Resp panel by RT-PCR (RSV, Flu A&B, Covid) Anterior Nasal Swab     Status: None   Collection Time: 08/06/24  6:45 PM   Specimen: Anterior Nasal Swab  Result Value Ref Range Status   SARS Coronavirus 2 by RT PCR NEGATIVE NEGATIVE Final    Comment: (NOTE) SARS-CoV-2 target nucleic acids are NOT DETECTED.  The SARS-CoV-2 RNA is  generally detectable in upper respiratory specimens during the acute phase of infection. The lowest concentration of SARS-CoV-2 viral copies this assay can detect is 138 copies/mL. A negative result does not preclude SARS-Cov-2 infection and should not be used as the sole basis for treatment or other patient management decisions. A negative result may occur with  improper specimen collection/handling, submission of specimen other than nasopharyngeal swab, presence of viral mutation(s) within the areas targeted by this assay, and inadequate number of viral copies(<138 copies/mL). A negative result must be combined with clinical observations, patient history, and epidemiological information. The expected result is Negative.  Fact Sheet for Patients:  BloggerCourse.com  Fact Sheet for Healthcare Providers:  SeriousBroker.it  This test is no t yet approved or cleared by the [place]  FDA and  has been authorized for detection and/or diagnosis of SARS-CoV-2 by FDA under an Emergency Use Authorization (EUA). This EUA will remain  in effect (meaning this test can be used) for the duration of the COVID-19 declaration under Section 564(b)(1) of the Act, 21 U.S.C.section 360bbb-3(b)(1), unless the authorization is terminated  or revoked sooner.       Influenza A by PCR NEGATIVE NEGATIVE Final   Influenza B by PCR NEGATIVE NEGATIVE Final    Comment: (NOTE) The Xpert Xpress SARS-CoV-2/FLU/RSV plus assay is intended as an aid in the diagnosis of influenza from Nasopharyngeal swab specimens and should not be used as a sole basis for treatment. Nasal washings and aspirates are unacceptable for Xpert Xpress SARS-CoV-2/FLU/RSV testing.  Fact Sheet for Patients: BloggerCourse.com  Fact Sheet for Healthcare Providers: SeriousBroker.it  This test is not yet approved or cleared by the [place] FDA and has been authorized for detection and/or diagnosis of SARS-CoV-2 by FDA under an Emergency Use Authorization (EUA). This EUA will remain in effect (meaning this test can be used) for the duration of the COVID-19 declaration under Section 564(b)(1) of the Act, 21 U.S.C. section 360bbb-3(b)(1), unless  the authorization is terminated or revoked.     Resp Syncytial Virus by PCR NEGATIVE NEGATIVE Final    Comment: (NOTE) Fact Sheet for Patients: BloggerCourse.com  Fact Sheet for Healthcare Providers: SeriousBroker.it  This test is not yet approved or cleared by the [place]  FDA and has been authorized for detection and/or diagnosis of SARS-CoV-2 by FDA under an Emergency Use Authorization (EUA). This EUA will remain in effect (meaning this test can be used) for the duration of the COVID-19 declaration under Section 564(b)(1) of the Act, 21 U.S.C. section 360bbb-3(b)(1), unless the authorization is terminated or revoked.  Performed at [facility], [address] [place], [place] [address]   MRSA Next Gen by PCR, Nasal     Status: Abnormal   Collection Time: 08/06/24  9:47 PM   Specimen: Nasal Mucosa; Nasal Swab  Result Value Ref Range Status   MRSA by PCR Next Gen DETECTED (A) NOT DETECTED Final    Comment: CRITICAL RESULT CALLED TO, READ BACK BY AND VERIFIED WITH: HAYES S. @ 0551 08/07/2024 MCLEAN K. (NOTE) The GeneXpert MRSA Assay (FDA approved for NASAL specimens only), is one component of a comprehensive MRSA colonization surveillance program. It is not intended to diagnose MRSA infection nor to guide or monitor treatment for MRSA infections. Test performance is not FDA approved in patients less than 13 years old. Performed at [facility], [address], [place], [place] [address]       Radiology Studies: VAS US  LOWER EXTREMITY VENOUS (DVT) Result Date:  08/07/2024  Lower Venous DVT Study Patient Name:  Mandy Adams  Date of Exam:   08/07/2024 Medical Rec #: [id number]         Accession #:    [id number] Date of Birth: November 06, 1932         Patient Gender: F Patient Age:   64 years Exam Location:  [facility] Procedure:      VAS US  LOWER EXTREMITY VENOUS (DVT) Referring Phys: BLEASE DOUTOVA --------------------------------------------------------------------------------  Indications: Pulmonary embolism, SOB, and Left lower extremity pain. Patient discharged to SNF status post hip replacement 08/07/24, then readmitted same day of discharge with shortness of breath. Other Indications: Left hip replacement 08/03/24. CT of chest revealed acute PE                    with right heart strain and large hiatal hernia. Risk Factors: Dementia. Limitations: Pain with compression maneuvers. Comparison Study: No prior study on file Performing Technologist: Alberta Lis RVS  Examination Guidelines: A complete evaluation includes B-mode imaging, spectral Doppler, color Doppler, and power Doppler as needed of all accessible portions of each vessel. Bilateral testing is considered an integral part of a complete examination. Limited examinations for reoccurring indications may be performed as noted. The reflux portion of the exam is performed with the patient in reverse Trendelenburg.  +-----+---------------+---------+-----------+-------------------+--------------+ RIGHTCompressibilityPhasicitySpontaneityProperties         Thrombus Aging +-----+---------------+---------+-----------+-------------------+--------------+ CFV  Full           Yes      No         Pulsatile waveforms               +-----+---------------+---------+-----------+-------------------+--------------+ SFJ  Full                                                                 +-----+---------------+---------+-----------+-------------------+--------------+    +---------+---------------+---------+-----------+---------------+--------------+  LEFT     CompressibilityPhasicitySpontaneityProperties     Thrombus Aging +---------+---------------+---------+-----------+---------------+--------------+ CFV                     Yes      No         Pulsatile      patent by                                                  waveforms      color and                                                                 Doppler        +---------+---------------+---------+-----------+---------------+--------------+ SFJ                     Yes      No         Pulsatile      patent by                                                  waveforms      color and                                                                 Doppler        +---------+---------------+---------+-----------+---------------+--------------+ FV Prox  Full           Yes      No         Pulsatile                                                                 waveforms                     +---------+---------------+---------+-----------+---------------+--------------+ FV Mid   Full                                                             +---------+---------------+---------+-----------+---------------+--------------+ FV DistalFull           Yes      No         Pulsatile  waveforms                     +---------+---------------+---------+-----------+---------------+--------------+ PFV      Full           Yes      No                                       +---------+---------------+---------+-----------+---------------+--------------+ POP      Full           Yes      No         Pulsatile                                                                 waveforms                     +---------+---------------+---------+-----------+---------------+--------------+ PTV       Full                                                             +---------+---------------+---------+-----------+---------------+--------------+ PERO     Full                                                             +---------+---------------+---------+-----------+---------------+--------------+     Summary: RIGHT: - No evidence of common femoral vein obstruction.   LEFT: - There is no evidence of deep vein thrombosis in the lower extremity.  - No cystic structure found in the popliteal fossa. - Ultrasound characteristics of enlarged lymph nodes noted in the groin. Interstitial edema noted throughout left lower extremity.  *See table(s) above for measurements and observations. Electronically signed by Lonni Gaskins MD on 08/07/2024 at 1:50:33 PM.    Final        LOS: 2 days   Joette Pebbles  [facility] Pager on [email or web address]  08/09/2024, 9:40 AM

## 2024-08-09 NOTE — Plan of Care (Signed)

## 2024-08-09 NOTE — TOC Initial Note (Signed)
 Transition of Care [facility]) - Initial/Assessment Note    Patient Details  Name: Mandy Adams MRN: [id number] Date of Birth: Dec 21, 1931  [facility] CM/SW Contact:    Sonda Manuella Quill, RN Phone Number: 08/09/2024, 3:11 PM  Clinical Narrative:                 [facility] consult for d/c planning; pt d/c'd to [facility] place 08/06/24; spoke w/ pt's son Mandy Adams [phone]); he also gave POC Mandy Adams (dtr) [phone]; he said pt was previously from [facility] [place]; she was briefly at [facility] but emergently returned to hospital the same day; he said pt uses facility providers; Mr Adams verified insurance; he denied pt experiencing SDOH risks; pt has walker; she does not receive HH services, or home oxygen; pt has glasses, dentures (upper/lower) and HAs; Mr Adams said family would like for pt to d/c w/ [facility] hospice; he also said  pt would d/c back to [facility] w/ hospice if facility cannot provide care; if not family would like for pt to d/c to [facility] for long-term care; referral for home hospice given to [facility], Hospital Liaison for [facility]; she will contact pt's family; oncoming IPCM will follow up w/ [facility] in AM.  Expected Discharge Plan: Skilled Nursing Facility Barriers to Discharge: Continued Medical Work up   Patient Goals and CMS Choice Patient states their goals for this hospitalization and ongoing recovery are:: to be determined CMS Medicare.gov Compare Post Acute Care list provided to:: Patient Represenative (must comment) Daryll Adams (son))   [employer] ownership interest in [facility].provided to:: Adult Children    Expected Discharge Plan and Services   Discharge Planning Services: CM Consult   Living arrangements for the past 2 months: Assisted Living Facility                 DME Arranged: N/A DME Agency: NA       HH Arranged: NA HH Agency: NA        Prior Living Arrangements/Services Living arrangements  for the past 2 months: Assisted Living Facility Lives with:: Facility Resident Patient language and need for interpreter reviewed:: Yes Do you feel safe going back to the place where you live?: Yes      Need for Family Participation in Patient Care: Yes (Comment) Care giver support system in place?: Yes (comment) Current home services: DME (walker, shower chair) Criminal Activity/Legal Involvement Pertinent to Current Situation/Hospitalization: No - Comment as needed  Activities of Daily Living   ADL Screening (condition at time of admission) Independently performs ADLs?: No Does the patient have a NEW difficulty with bathing/dressing/toileting/self-feeding that is expected to last >3 days?: Yes (Initiates electronic notice to provider for possible OT consult) Does the patient have a NEW difficulty with getting in/out of bed, walking, or climbing stairs that is expected to last >3 days?: Yes (Initiates electronic notice to provider for possible PT consult) Does the patient have a NEW difficulty with communication that is expected to last >3 days?: No Is the patient deaf or have difficulty hearing?: Yes Does the patient have difficulty seeing, even when wearing glasses/contacts?: No Does the patient have difficulty concentrating, remembering, or making decisions?: Yes  Permission Sought/Granted Permission sought to share information with : Case Manager Permission granted to share information with : Yes, Verbal Permission Granted  Share Information with NAME: Case Manager     Permission granted to share info w Relationship: Mandy Adams( son) [phone] /  Mandy Adams ([phone]     Emotional Assessment Appearance:: Other (Comment Required (unable to assess) Attitude/Demeanor/Rapport: Unable to Assess Affect (typically observed): Unable to Assess Orientation: :  (unable to assess) Alcohol  / Substance Use: Not Applicable Psych Involvement: No (comment)  Admission diagnosis:   Pulmonary embolism (HCC) [I26.99] Acute pulmonary embolism with acute cor pulmonale, unspecified pulmonary embolism type (HCC) [I26.09] Acute hypoxic respiratory failure (HCC) [J96.01] Patient Active Problem List   Diagnosis Date Noted   Palliative care encounter 08/08/2024   Counseling and coordination of care 08/08/2024   ACP (advance care planning) 08/08/2024   Debility 08/08/2024   Decreased oral intake 08/08/2024   DNR (do not resuscitate) 08/08/2024   High risk medication use 08/08/2024   Medication management 08/08/2024   Pain 08/08/2024   Dementia (HCC) 08/08/2024   Acute hypoxic respiratory failure (HCC) 08/08/2024   Acute pulmonary embolism (HCC) 08/06/2024   Pulmonary embolism (HCC) 08/06/2024   Anemia 08/06/2024   Acute respiratory failure with hypoxia (HCC) 08/06/2024   Closed subcapital fracture of neck of femur, left, initial encounter (HCC) 08/03/2024   Atrophic vaginitis 08/03/2024   Adjustment disorder 08/03/2024   Gastro-esophageal reflux disease without esophagitis 08/03/2024   Essential hypertension 08/03/2024   Eczema 08/03/2024   Hardening of the aorta (main artery of the heart) 08/03/2024   Moderate dementia (HCC) 08/03/2024   Osteoarthritis 08/03/2024   Pure hypercholesterolemia 08/03/2024   Pressure injury of buttock, stage 2 (HCC) 08/03/2024   Stage 3 chronic kidney disease (HCC) 08/03/2024   Mild protein malnutrition 08/03/2024   Lactic acidosis 08/03/2024   Hypokalemia 04/30/2018   Community acquired pneumonia 04/30/2018   Leukocytosis 04/30/2018   AKI (acute kidney injury) 04/30/2018   Diaphragmatic hernia without obstruction and without gangrene    PCP:  Patient, No Pcp Per Pharmacy:   [id number] - [place], [place] - [address] [place]  [address] Phone: [phone] Fax: [phone]     Social Drivers of Health (SDOH) Social History: SDOH Screenings   Food Insecurity: No Food Insecurity (08/09/2024)   Housing: Low Risk  (08/09/2024)  Transportation Needs: No Transportation Needs (08/09/2024)  Utilities: Not At Risk (08/09/2024)  Social Connections: Patient Unable To Answer (08/06/2024)  Tobacco Use: Low Risk  (08/03/2024)   SDOH Interventions: Food Insecurity Interventions: Intervention Not Indicated, Inpatient TOC Housing Interventions: Intervention Not Indicated, Inpatient TOC Transportation Interventions: Intervention Not Indicated, Inpatient TOC Utilities Interventions: Intervention Not Indicated, Inpatient TOC   Readmission Risk Interventions    08/09/2024    3:03 PM 08/04/2024    1:35 PM  Readmission Risk Prevention Plan  Transportation Screening Complete Complete  PCP or Specialist Appt within 5-7 Days  Complete  Home Care Screening  Complete  Medication Review (RN CM)  Complete  Medication Review (RN Care Manager) Complete   PCP or Specialist appointment within 3-5 days of discharge Complete   HRI or Home Care Consult Complete   SW Recovery Care/Counseling Consult Complete   Palliative Care Screening Complete   Skilled Nursing Facility Complete

## 2024-08-09 NOTE — Progress Notes (Signed)
 Daily Progress Note   Patient Name: Natara Monfort       Date: 08/09/2024 DOB: [birth year]  Age: 89 y.o. MRN#: [id number] Attending Physician: Verdene Purchase, MD Primary Care Physician: Patient, No Pcp Per Admit Date: 08/06/2024 Length of Stay: 2 days  Reason for Consultation/Follow-up: Establishing goals of care  Subjective:   CC: Patient sleeping comfortably.  Spoke with son at bedside.  Following up regarding complex medical decision making.  Subjective:  Reviewed EMR including recent documentation from hospitalist.  At time of EMR review patient still taking oral medications.  Has not required as needed medications for symptom management. Discussed care with RN for updates.  Presented to bedside to see patient.  Patient was sleeping comfortably so did not awaken.  Patient's son was present at bedside.  Son updated that patient was able to eat some [place] this morning and that made her incredibly happy.  Acknowledged and supported quality of life and enjoyment for patient.  Son noted that his sister had reached out to [facility] about SNF level long-term care.  He also noted that his sister heard from [facility] that they may be able to provide essentially SNF level of care at patient's ALF facility with [facility] hospice layered in.  We discussed this and spent time answering questions as able.  Encouraged all questions answered to family satisfaction by her to screen to make sure that they can support the level of care patient would need moving forward for end-of-life care with hospice support.  Son acknowledged this and noted they would follow-up regarding this.  At this time continuing comfort focused measures while patient in the hospital.  All questions answered at that time.  Noted bowel medicine team continue following patient's medical journey.  Objective:   Vital Signs:  BP (!) 145/78 (BP Location: Right Arm)   Pulse 90   Temp 98.6 F (37 C) (Oral)   Resp 18    Ht 5' (1.524 m)   Wt 55.1 kg   SpO2 97%   BMI 23.72 kg/m   Physical Exam: General: Sleeping, cachectic, frail, chronically ill-appearing Cardiovascular: RRR Respiratory: no increased work of breathing noted, not in respiratory distress Neuro: Sleeping comfortably, normally pleasantly confused when awake  Assessment & Plan:   Assessment: Patient is a 89 year old female with a past medical history of recent left hip fracture status post surgery, CKD stage III AA, dementia, hyperlipidemia, hypertension, leukocytosis, GERD, eczema, and aortic atherosclerosis who was admitted on 08/06/2024 from nursing facility for management of shortness of breath and was noted to be cyanotic.  Patient had been discharged to SNF for rehab the same day she was readmitted after she had undergone surgical intervention for her left hip fracture after a fall.  Since being admitted, patient has received management for newly diagnosed acute pulmonary embolism, acute respiratory failure with hypoxia, and bilateral pleural effusions.  Bowel medicine team consulted to assist with complex medical decision making.   Recommendations/Plan: # Complex medical decision making/goals of care                -Patient unable to participate in medical decision making secondary to medical status.                -Spoke with patient's son at bedside as detailed above in HPI.  Had already discussed care with patient's son and daughter extensively on 08/08/2024 and patient was transition to full comfort focused care at that time.  Seeking patient getting long-term care at a facility  with hospice support.  Daughter working to determine if patient would return to ALF with appropriate support and hospice versus needing SNF level of care transfer for long-term care with hospice.  Had already discussed if patient medically deteriorates, could consider inpatient hospice.  Palliative medicine team continuing to follow along with patient's medical  journey.                - Have already discontinued interventions that are no longer focused on comfort such as IV fluids, imaging, or lab work.  Focusing on symptom management of pain, dyspnea, and agitation in the setting of end-of-life care.                  Code Status: Do not attempt resuscitation (DNR) - Comfort care   # Symptom management Patient is receiving these palliative interventions for symptom management with an intent to improve quality of life.                   -Pain/Dyspnea, acute in the setting of end-of-life care                Patient was not on medications for pain previously.                               - Continue oral oxycodone  5 mg every 2 hours as needed                               - Continue IV Dilaudid  0.5 mg every 2 hours as needed only for breakthrough pain or shortness of breath after oral opioids                  -Anxiety/agitation, in the setting of end-of-life care                               - Continue Haldol  as needed                  -Secretions, in the setting of end-of-life care                               - Continue glycopyrrolate as needed   # Psycho-social/Spiritual Support:  - Support System: Daughter, son   # Discharge Planning:  Skilled Nursing Facility with Hospice  Discussed with: RN, patient's son  Thank you for allowing the palliative care team to participate in the care Charolette Garret.  Tinnie Radar, DO Palliative Care Provider PMT # [phone]  If patient remains symptomatic despite maximum doses, please call PMT at [phone] between 0700 and 1900. Outside of these hours, please call attending, as PMT does not have night coverage.  Personally spent 40 minutes in patient care including extensive chart review (labs, imaging, progress/consult notes, vital signs), medically appropraite exam, discussed with treatment team, education to patient, family, and staff, documenting clinical information, medication review and  management, coordination of care, and available advanced directive documents.

## 2024-08-09 NOTE — Progress Notes (Signed)
 [facility] LIAISON NOTE   Received request from Manuella Hoit, Transitions of Care (TOC), for hospice services at home after discharge. Spoke with patient's son, [name], to initiate education related to hospice philosophy, services, and team approach to care. Patient/family verbalized understanding of information given. Patient currently lives at [facility].  Family is contacting the facility to see if patient is able to return there for EOL care with hospice.  If she is not able to return, they would like to see if [facility] would be an option.  Either way, they would like hospice care moving forward.  DME needs discussed.    Patient has the following equipment in the home:  rolling walker  Patient/family requests the following equipment for delivery:  hospital bed and over bed table. (Depending on what type of facility she discharges to.)  The address has been verified and is correct in the chart.  Please send signed and completed DNR home with patient/family if applicable.   Please provide prescriptions at discharge as needed to ensure ongoing symptom management.  [facility] information and contact numbers given to [place].  Above information shared with Manuella Hoit, [facility] and hospital medical care team.  Please call with any hospice related questions or concerns.  Thank you for the opportunity to participate in this patient's care.  Saddie HILARIO Na, MA, BSN, RN, FNE Nurse Liaison [phone]

## 2024-08-10 ENCOUNTER — Other Ambulatory Visit: Payer: Self-pay

## 2024-08-10 ENCOUNTER — Encounter (HOSPITAL_COMMUNITY): Payer: Self-pay | Admitting: Internal Medicine

## 2024-08-10 ENCOUNTER — Other Ambulatory Visit (HOSPITAL_COMMUNITY): Payer: Self-pay

## 2024-08-10 DIAGNOSIS — I2609 Other pulmonary embolism with acute cor pulmonale: Secondary | ICD-10-CM | POA: Diagnosis not present

## 2024-08-10 MED ORDER — GLYCOPYRROLATE 1 MG PO TABS
1.0000 mg | ORAL_TABLET | ORAL | 0 refills | Status: AC | PRN
  Filled 2024-08-10: qty 30, 5d supply, fill #0

## 2024-08-10 MED ORDER — OXYCODONE HCL 20 MG/ML PO CONC
5.0000 mg | ORAL | 0 refills | Status: DC | PRN
  Filled 2024-08-10: qty 30, 9d supply, fill #0

## 2024-08-10 MED ORDER — HALOPERIDOL 1 MG PO TABS
0.5000 mg | ORAL_TABLET | ORAL | 0 refills | Status: AC | PRN
  Filled 2024-08-10: qty 15, 5d supply, fill #0

## 2024-08-10 MED ORDER — APIXABAN 5 MG PO TABS
5.0000 mg | ORAL_TABLET | Freq: Two times a day (BID) | ORAL | 1 refills | Status: AC
  Filled 2024-08-10: qty 60, 30d supply, fill #0

## 2024-08-10 MED ORDER — MORPHINE SULFATE (CONCENTRATE) 20 MG/ML PO SOLN
5.0000 mg | ORAL | 0 refills | Status: AC | PRN
  Filled 2024-08-10: qty 30, 7d supply, fill #0

## 2024-08-10 MED ORDER — APIXABAN (ELIQUIS) VTE STARTER PACK (10MG AND 5MG)
ORAL_TABLET | ORAL | 0 refills | Status: AC
  Filled 2024-08-10: qty 74, 30d supply, fill #0

## 2024-08-10 NOTE — Progress Notes (Signed)
   08/10/24 1335  Spiritual Encounters  Type of Visit Attempt (pt unavailable)   Per referral through palliative care and Elia Pee, I attempted to visit Ms. Jamil. Will follow up; please page for any needs.  Leanza Shepperson L. Delores HERO.Div [phone]

## 2024-08-10 NOTE — Progress Notes (Signed)
 Daily Progress Note   Patient Name: Mandy Adams       Date: 08/10/2024 DOB: 1932/08/20  Age: 89 y.o. MRN#: [id number] Attending Physician: Verdene Purchase, MD Primary Care Physician: Patient, No Pcp Per Admit Date: 08/06/2024 Length of Stay: 3 days  Reason for Consultation/Follow-up: Establishing goals of care  Subjective:   CC: Patient sleeping comfortably. Following up regarding complex medical decision making.  Subjective:  Reviewed EMR including recent documentation from hospitalist.  Discussed care with bedside RN for updates.  Patient's son had been present at bedside earlier in the day.  When presenting to bedside to see the patient, patient was sleeping comfortably so did not awaken.  No family present at bedside.  Able to discuss care with hospitalist, [facility], [facility] liaison, and RN.  TOC able to reach out to patient's son.  Plan is for patient to return to [facility] ALF where she will receive additional 27/4 care to allow for comfort focused care at the end of life with hospice layered in.  [facility] just needs hospital bed for patient which is being ordered.  Patient will plan to discharge back to [facility] with [facility] hospice once this is coordinated.  Objective:   Vital Signs:  BP 129/67 (BP Location: Right Arm)   Pulse 79   Temp (!) 97.5 F (36.4 C) (Oral)   Resp 18   Ht 5' (1.524 m)   Wt 55.1 kg   SpO2 92%   BMI 23.72 kg/m   Physical Exam: General: Sleeping, cachectic, frail, chronically ill-appearing Cardiovascular: RRR Respiratory: no increased work of breathing noted, not in respiratory distress Neuro: Sleeping comfortably, normally pleasantly confused when awake  Assessment & Plan:   Assessment: Patient is a 89 year old female with a past medical history of recent left hip fracture status post surgery, CKD stage III AA, dementia, hyperlipidemia, hypertension, leukocytosis, GERD, eczema, and aortic atherosclerosis who was admitted on 08/06/2024  from nursing facility for management of shortness of breath and was noted to be cyanotic.  Patient had been discharged to SNF for rehab the same day she was readmitted after she had undergone surgical intervention for her left hip fracture after a fall.  Since being admitted, patient has received management for newly diagnosed acute pulmonary embolism, acute respiratory failure with hypoxia, and bilateral pleural effusions.  Bowel medicine team consulted to assist with complex medical decision making.   Recommendations/Plan: # Complex medical decision making/goals of care                -Patient unable to participate in medical decision making secondary to medical status.                - Had already discussed care with patient's son and daughter extensively on 08/08/2024 and patient was transitioned to full comfort focused care at that time.  Family has decided for patient to return to her ALF at [facility] with additional support to allow for care support at end-of-life.  [facility] hospice to follow as well.  TOC assisting with coordination of care and discharge planning.  Palliative medicine team continuing to follow along with patient's medical journey.                - Have already discontinued interventions that are no longer focused on comfort such as IV fluids, imaging, or lab work.  Focusing on symptom management of pain, dyspnea, and agitation in the setting of end-of-life care.  Code Status: Do not attempt resuscitation (DNR) - Comfort care   # Symptom management Patient is receiving these palliative interventions for symptom management with an intent to improve quality of life.                   -Pain/Dyspnea, acute in the setting of end-of-life care                Patient was not on medications for pain previously.                               - Continue oral oxycodone  5 mg every 2 hours as needed                               - Continue IV Dilaudid  0.5 mg every 2 hours as  needed only for breakthrough pain or shortness of breath after oral opioids                  -Anxiety/agitation, in the setting of end-of-life care                               - Continue Haldol  as needed                  -Secretions, in the setting of end-of-life care                               - Continue glycopyrrolate as needed   # Psycho-social/Spiritual Support:  - Support System: Daughter, son   # Discharge Planning: [facility] working for patient to return to [facility] ALF (where patient came from) with additional support and hospice layered and to continue focusing on comfort care at end-of-life  Discussed with: Hospitalist, TOC, [facility] liaison, RN  Thank you for allowing the palliative care team to participate in the care Charolette Garret.  Tinnie Radar, DO Palliative Care Provider PMT # [phone]  If patient remains symptomatic despite maximum doses, please call PMT at [phone] between 0700 and 1900. Outside of these hours, please call attending, as PMT does not have night coverage.

## 2024-08-10 NOTE — TOC Progression Note (Addendum)
 Transition of Care [facility]) - Progression Note    Patient Details  Name: Mandy Adams MRN: [id number] Date of Birth: 1932-06-20  [facility] CM/SW Contact  Heather DELENA Saltness, LCSW Phone Number: 08/10/2024, 12:15 PM  Clinical Narrative:     ADDENDUM  12:23 PM - CSW spoke with Director, Cathrine [phone], at [facility] via phone call. Imand reports pt can return to [facility] with hospice services through [facility]. Imand reports hospital bed will need to be delivered to facility prior to pt's return. CSW sent discharge summary and FL2 to [facility] via fax at [phone]. CSW spoke with pt's son, [name], via phone call, to discuss discharge plan. [name] agreeable to discharge plan. TOC will continue to follow.  12:13 PM - CSW attempted to speak with [facility] via phone call at main number, [phone], regarding pt's discharge plan. No answer, voicemail left. TOC will continue to follow.   Expected Discharge Plan: Skilled Nursing Facility Barriers to Discharge: Continued Medical Work up   Expected Discharge Plan and Services   Discharge Planning Services: CM Consult   Living arrangements for the past 2 months: Assisted Living Facility                 DME Arranged: N/A DME Agency: NA       HH Arranged: NA HH Agency: NA         Social Drivers of Health (SDOH) Interventions SDOH Screenings   Food Insecurity: No Food Insecurity (08/09/2024)  Housing: Low Risk  (08/09/2024)  Transportation Needs: No Transportation Needs (08/09/2024)  Utilities: Not At Risk (08/09/2024)  Social Connections: Patient Unable To Answer (08/06/2024)  Tobacco Use: Low Risk  (08/10/2024)    Readmission Risk Interventions    08/09/2024    3:03 PM 08/04/2024    1:35 PM  Readmission Risk Prevention Plan  Transportation Screening Complete Complete  PCP or Specialist Appt within 5-7 Days  Complete  Home Care Screening  Complete  Medication Review (RN CM)   Complete  Medication Review (RN Care Manager) Complete   PCP or Specialist appointment within 3-5 days of discharge Complete   HRI or Home Care Consult Complete   SW Recovery Care/Counseling Consult Complete   Palliative Care Screening Complete   Skilled Nursing Facility Complete     Signed: Heather Saltness, MSW, LCSW Clinical Social Worker Inpatient Care Management 08/10/2024 12:16 PM

## 2024-08-10 NOTE — Progress Notes (Signed)
 This nurse tried calling Mandy Adams at [facility] two times. No answer. I left Mandy Adams a message with the unit call back number both times.

## 2024-08-10 NOTE — TOC Transition Note (Addendum)
 Transition of Care [facility]) - Discharge Note   Patient Details  Name: Mandy Adams MRN: [id number] Date of Birth: July 14, 1932  Transition of Care [facility]) CM/SW Contact:  Heather DELENA Saltness, LCSW Phone Number: 08/10/2024, 2:21 PM   Clinical Narrative:    Pt discharging back to [facility] today. Pt discharging with hospice services through [facility]. D/C packet with DNR and signed prescriptions placed in pt's chart at RN station. RN to call report to [employer], Interior and spatial designer at [facility], at [phone]. PT[facility] to transport pt to facility. [facility] called at 3:47 PM. [facility] scheduled to pick pt up at 5 PM. Pt's son, [name], [phone], made aware and is in agreement with discharge plan. No further TOC needs at this time.   Final next level of care: Long Term Nursing Home Barriers to Discharge: Barriers Resolved   Patient Goals and CMS Choice Patient states their goals for this hospitalization and ongoing recovery are:: To return to [facility].gov Compare Post Acute Care list provided to:: Patient Represenative (must comment) Choice offered to / list presented to : [facility] POA / Guardian [place] ownership interest in [facility].provided to:: Adult Children    Discharge Placement              Patient chooses bed at: [facility] Patient to be transferred to facility by: [facility] Name of family member notified: Pt's son/POA, [name] Patient and family notified of of transfer: 08/10/24  Discharge Plan and Services Additional resources added to the After Visit Summary for  [facility]   Discharge Planning Services: CM Consult            DME Arranged: N/A DME Agency: NA       HH Arranged: NA HH Agency: NA        Social Drivers of Health (SDOH) Interventions SDOH Screenings   Food Insecurity: No Food Insecurity (08/09/2024)  Housing: Low Risk  (08/09/2024)  Transportation Needs: No Transportation Needs (08/09/2024)  Utilities: Not At Risk  (08/09/2024)  Social Connections: Patient Unable To Answer (08/06/2024)  Tobacco Use: Low Risk  (08/10/2024)     Readmission Risk Interventions    08/09/2024    3:03 PM 08/04/2024    1:35 PM  Readmission Risk Prevention Plan  Transportation Screening Complete Complete  PCP or Specialist Appt within 5-7 Days  Complete  Home Care Screening  Complete  Medication Review (RN CM)  Complete  Medication Review (RN Care Manager) Complete   PCP or Specialist appointment within 3-5 days of discharge Complete   HRI or Home Care Consult Complete   SW Recovery Care/Counseling Consult Complete   Palliative Care Screening Complete   Skilled Nursing Facility Complete      Signed: Heather Saltness, MSW, LCSW Clinical Social Worker Inpatient Care Management 08/10/2024 3:49 PM

## 2024-08-10 NOTE — Progress Notes (Signed)
 [id number]  [facility] Liaison Note  Received a referral for hospice services at facility after discharge from [facility]. Spoke with daughter Olam, on the telephone to initiate education related to hospice philosophy, services and team approach to care. Family verbalized understanding of information given.  Per discussion, the plan is for discharge to [facility] via EMS/[facility] with hospice on Monday, 9.29.  Per family, a hospital bed will need to be delivered prior to discharge. [name], daughter, [phone] is the family contact.  Please send signed and completed DNR home with the patient.  Please provide prescriptions at discharge as needed to ensure ongoing symptom management.  [facility] information and contact numbers given to [place].  Above information shared with Transitions of Care Manager.  Please call with any questions or concerns.  Thank you for the opportunity to participate in this patient's care.  Inocente Jacobs, BSN, RN [employer] [phone]

## 2024-08-10 NOTE — Discharge Summary (Addendum)
 [facility]  Physician Discharge Summary   Patient ID: Mandy Adams MRN: [id number] DOB/AGE: 17-Dec-1931 89 y.o.  Admit date: 08/06/2024 Discharge date:   08/10/2024   PCP: Patient, No Pcp Per  DISCHARGE DIAGNOSES:    Acute Pulmonary embolism (HCC)   Gastro-esophageal reflux disease without esophagitis   Essential hypertension   Moderate dementia (HCC)   Stage 3 chronic kidney disease (HCC)   Anemia   Acute respiratory failure with hypoxia (HCC)   RECOMMENDATIONS FOR OUTPATIENT FOLLOW UP: Patient transitioning to hospice   Home Health: None Equipment/Devices: None  CODE STATUS: DNR  DISCHARGE CONDITION: fair  Diet recommendation: Comfort feeds as tolerated  INITIAL HISTORY: 89 y.o. female with medical history significant of AKI, CAP, dementia, hyperlipidemia, hypertension, leukocytosis, GERD, eczema, aortic atherosclerosis, stage IIIa CKD who was hospitalized a few days ago after a fall resulting in left hip fracture.  She underwent surgery.  She also developed acute kidney injury due to dehydration.  Was also transfused PRBC during previous hospitalization.  She was stabilized and then discharged to SNF.  Within a few hours after arriving at SNF she started getting short of breath and was noted to be cyanotic.  She was brought back to the hospital and found to have acute pulmonary embolism   Consultants: Pulmonology.  Palliative care   Procedures: Echocardiogram.  Lower extremity Doppler  HOSPITAL COURSE:   Acute pulmonary embolism/acute respiratory failure with hypoxia This is in the setting of recent hip fracture.  She was on aspirin  twice a day for DVT prophylaxis after her recent surgery. CT angiogram showed PE.  Case was discussed with pulmonology who did not recommend thrombolytics.  Patient was also seen by pulmonology. Echocardiogram shows normal LVEF.  Right ventricular function is mildly reduced. No DVT noted on Doppler studies. Patient was  transition from IV heparin  to Eliquis .  Aspirin  discontinued. Has been weaned off of oxygen.     Bilateral pleural effusion/possible volume overload Elevated BNP and mildly elevated troponin levels also noted.   Large bilateral pleural effusions noted on CT scan.  Could have some degree of volume overload.  Echocardiogram shows normal LVEF.  She is hemodynamically stable. She was given IV furosemide  mainly for palliative purposes.   Acute encephalopathy in the setting of dementia Likely multifactorial including acute illness as well as hospital stay.  Improved with Haldol .  Continue Namenda .   Recent left hip fracture status post surgery on 9/22 Occasional pain in the left lower extremity.  Mobility as tolerated. Sutures need to come out 2 weeks from surgery which was on 08/03/2024.  Surgery was done by Dr. Jerri with [facility].   Chronic kidney disease stage IIIa/metabolic acidosis Had acute kidney injury during previous hospitalization but creatinine is now back to baseline.   Normocytic anemia Recent acute blood loss anemia for which she was transfused.  Hemoglobin stable.  Recently done anemia panel reviewed.  B12 level was noted to be low normal.  All nonessential medications have been discontinued.   History of essential hypertension/recent hypotension She was hypotensive during previous hospitalization which improved after blood transfusion.  She was on metoprolol recently but it was discontinued by her outpatient providers.  Blood pressures are stable.   Leukocytosis Likely reactive.  Resolved.   L1 vertebral body compression fracture Age-indeterminate L1 compression fracture was noted incidentally on recent CT scan.  She has been asymptomatic.   Sacral decubitus stage II right buttock Wound care.   Goals of Care Discussed with son. He and  his sister request a palliative consult as they state that the patient would not want such aggressive care. They want to de-escalate care.    Patient was seen by palliative care.  Plan is for transition to hospice.  Disposition is not clear yet.     Patient is stable.  Family is working on placement along with assistance from [facility].  Okay for discharge whenever disposition is clear.   PERTINENT LABS:  The results of significant diagnostics from this hospitalization (including imaging, microbiology, ancillary and laboratory) are listed below for reference.    Microbiology: Recent Results (from the past 240 hours)  Resp panel by RT-PCR (RSV, Flu A&B, Covid) Anterior Nasal Swab     Status: None   Collection Time: 08/06/24  6:45 PM   Specimen: Anterior Nasal Swab  Result Value Ref Range Status   SARS Coronavirus 2 by RT PCR NEGATIVE NEGATIVE Final    Comment: (NOTE) SARS-CoV-2 target nucleic acids are NOT DETECTED.  The SARS-CoV-2 RNA is generally detectable in upper respiratory specimens during the acute phase of infection. The lowest concentration of SARS-CoV-2 viral copies this assay can detect is 138 copies/mL. A negative result does not preclude SARS-Cov-2 infection and should not be used as the sole basis for treatment or other patient management decisions. A negative result may occur with  improper specimen collection/handling, submission of specimen other than nasopharyngeal swab, presence of viral mutation(s) within the areas targeted by this assay, and inadequate number of viral copies(<138 copies/mL). A negative result must be combined with clinical observations, patient history, and epidemiological information. The expected result is Negative.  Fact Sheet for Patients:  BloggerCourse.com  Fact Sheet for Healthcare Providers:  SeriousBroker.it  This test is no t yet approved or cleared by the [place]  FDA and  has been authorized for detection and/or diagnosis of SARS-CoV-2 by FDA under an Emergency Use Authorization (EUA). This EUA will remain  in  effect (meaning this test can be used) for the duration of the COVID-19 declaration under Section 564(b)(1) of the Act, 21 U.S.C.section 360bbb-3(b)(1), unless the authorization is terminated  or revoked sooner.       Influenza A by PCR NEGATIVE NEGATIVE Final   Influenza B by PCR NEGATIVE NEGATIVE Final    Comment: (NOTE) The Xpert Xpress SARS-CoV-2/FLU/RSV plus assay is intended as an aid in the diagnosis of influenza from Nasopharyngeal swab specimens and should not be used as a sole basis for treatment. Nasal washings and aspirates are unacceptable for Xpert Xpress SARS-CoV-2/FLU/RSV testing.  Fact Sheet for Patients: BloggerCourse.com  Fact Sheet for Healthcare Providers: SeriousBroker.it  This test is not yet approved or cleared by the [place]  FDA and has been authorized for detection and/or diagnosis of SARS-CoV-2 by FDA under an Emergency Use Authorization (EUA). This EUA will remain in effect (meaning this test can be used) for the duration of the COVID-19 declaration under Section 564(b)(1) of the Act, 21 U.S.C. section 360bbb-3(b)(1), unless the authorization is terminated or revoked.     Resp Syncytial Virus by PCR NEGATIVE NEGATIVE Final    Comment: (NOTE) Fact Sheet for Patients: BloggerCourse.com  Fact Sheet for Healthcare Providers: SeriousBroker.it  This test is not yet approved or cleared by the [place]  FDA and has been authorized for detection and/or diagnosis of SARS-CoV-2 by FDA under an Emergency Use Authorization (EUA). This EUA will remain in effect (meaning this test can be used) for the duration of the COVID-19 declaration under Section 564(b)(1) of the Act,  21 U.S.C. section 360bbb-3(b)(1), unless the authorization is terminated or revoked.  Performed at [facility], [address], [place], [place] [address]    MRSA Next Gen by PCR, Nasal     Status: Abnormal   Collection Time: 08/06/24  9:47 PM   Specimen: Nasal Mucosa; Nasal Swab  Result Value Ref Range Status   MRSA by PCR Next Gen DETECTED (A) NOT DETECTED Final    Comment: CRITICAL RESULT CALLED TO, READ BACK BY AND VERIFIED WITH: HAYES S. @ 0551 08/07/2024 MCLEAN K. (NOTE) The GeneXpert MRSA Assay (FDA approved for NASAL specimens only), is one component of a comprehensive MRSA colonization surveillance program. It is not intended to diagnose MRSA infection nor to guide or monitor treatment for MRSA infections. Test performance is not FDA approved in patients less than 42 years old. Performed at [facility], [address], [place], [place] [address]      Labs:   Basic Metabolic Panel: Recent Labs  Lab 08/05/24 0343 08/06/24 0332 08/06/24 1538 08/06/24 1545 08/06/24 2302 08/07/24 0227 08/08/24 0603  NA 137 141 142 140  --  141 139  K 4.1 4.5 3.9 4.0  --  4.2 4.4  CL 109 113* 113* 113*  --  113* 110  CO2 16* 18*  --  18*  --  20* 19*  GLUCOSE 156* 117* 121* 124*  --  90 70  BUN 39* 39* 32* 34*  --  29* 20  CREATININE 1.67* 1.41* 1.30* 1.24*  --  1.20* 1.03*  CALCIUM  7.8* 8.3*  --  8.3*  --  8.1* 8.2*  MG  --   --   --   --  2.4 2.3 2.4  PHOS  --   --   --   --  1.9* 1.8* 2.5   Liver Function Tests: Recent Labs  Lab 08/07/24 0227  AST 65*  ALT 6  ALKPHOS 73  BILITOT 0.5  PROT 5.5*  ALBUMIN  3.2*    CBC: Recent Labs  Lab 08/05/24 0343 08/05/24 1534 08/06/24 0332 08/06/24 1538 08/06/24 1545 08/07/24 0227 08/08/24 0603  WBC 10.6*  --  12.4*  --  11.6* 11.4* 7.7  NEUTROABS  --   --   --   --  8.9*  --   --   HGB 7.3*   < > 9.1* 9.9* 9.7* 9.1* 9.1*  HCT 25.0*   < > 30.3* 29.0* 31.5* 29.9* 29.7*  MCV 90.6  --  89.6  --  89.2 89.0 92.2  PLT 208  --  190  --  PLATELET CLUMPS NOTED ON SMEAR, UNABLE TO ESTIMATE 239 244   < > = values in this interval not displayed.   Cardiac  Enzymes: Recent Labs  Lab 08/06/24 2302 08/07/24 0227  CKTOTAL 882* 932*    ProBNP (last 3 results) Recent Labs    08/06/24 1545  PROBNP 22,337.0*    IMAGING STUDIES VAS US  LOWER EXTREMITY VENOUS (DVT) Result Date: 08/07/2024  Lower Venous DVT Study Patient Name:  TEANA LINDAHL  Date of Exam:   08/07/2024 Medical Rec #: [id number]         Accession #:    [id number] Date of Birth: [birth year]         Patient Gender: F Patient Age:   44 years Exam Location:  [facility] Procedure:      VAS US  LOWER EXTREMITY VENOUS (DVT) Referring Phys: BLEASE DOUTOVA --------------------------------------------------------------------------------  Indications: Pulmonary embolism, SOB, and Left  lower extremity pain. Patient discharged to SNF status post hip replacement 08/07/24, then readmitted same day of discharge with shortness of breath. Other Indications: Left hip replacement 08/03/24. CT of chest revealed acute PE                    with right heart strain and large hiatal hernia. Risk Factors: Dementia. Limitations: Pain with compression maneuvers. Comparison Study: No prior study on file Performing Technologist: Alberta Lis RVS  Examination Guidelines: A complete evaluation includes B-mode imaging, spectral Doppler, color Doppler, and power Doppler as needed of all accessible portions of each vessel. Bilateral testing is considered an integral part of a complete examination. Limited examinations for reoccurring indications may be performed as noted. The reflux portion of the exam is performed with the patient in reverse Trendelenburg.  +-----+---------------+---------+-----------+-------------------+--------------+ RIGHTCompressibilityPhasicitySpontaneityProperties         Thrombus Aging +-----+---------------+---------+-----------+-------------------+--------------+ CFV  Full           Yes      No         Pulsatile waveforms                +-----+---------------+---------+-----------+-------------------+--------------+ SFJ  Full                                                                 +-----+---------------+---------+-----------+-------------------+--------------+   +---------+---------------+---------+-----------+---------------+--------------+ LEFT     CompressibilityPhasicitySpontaneityProperties     Thrombus Aging +---------+---------------+---------+-----------+---------------+--------------+ CFV                     Yes      No         Pulsatile      patent by                                                  waveforms      color and                                                                 Doppler        +---------+---------------+---------+-----------+---------------+--------------+ SFJ                     Yes      No         Pulsatile      patent by                                                  waveforms      color and  Doppler        +---------+---------------+---------+-----------+---------------+--------------+ FV Prox  Full           Yes      No         Pulsatile                                                                 waveforms                     +---------+---------------+---------+-----------+---------------+--------------+ FV Mid   Full                                                             +---------+---------------+---------+-----------+---------------+--------------+ FV DistalFull           Yes      No         Pulsatile                                                                 waveforms                     +---------+---------------+---------+-----------+---------------+--------------+ PFV      Full           Yes      No                                       +---------+---------------+---------+-----------+---------------+--------------+  POP      Full           Yes      No         Pulsatile                                                                 waveforms                     +---------+---------------+---------+-----------+---------------+--------------+ PTV      Full                                                             +---------+---------------+---------+-----------+---------------+--------------+ PERO     Full                                                             +---------+---------------+---------+-----------+---------------+--------------+  Summary: RIGHT: - No evidence of common femoral vein obstruction.   LEFT: - There is no evidence of deep vein thrombosis in the lower extremity.  - No cystic structure found in the popliteal fossa. - Ultrasound characteristics of enlarged lymph nodes noted in the groin. Interstitial edema noted throughout left lower extremity.  *See table(s) above for measurements and observations. Electronically signed by Lonni Gaskins MD on 08/07/2024 at 1:50:33 PM.    Final    ECHOCARDIOGRAM COMPLETE Result Date: 08/07/2024    ECHOCARDIOGRAM REPORT   Patient Name:   Mandy Adams Date of Exam: 08/07/2024 Medical Rec #:  [id number]        Height:       60.0 in Accession #:    [id number]       Weight:       121.5 lb Date of Birth:  1932-09-03        BSA:          1.510 m Patient Age:    89 years         BP:           94/40 mmHg Patient Gender: F                HR:           85 bpm. Exam Location:  Inpatient Procedure: 2D Echo, Cardiac Doppler and Color Doppler (Both Spectral and Color            Flow Doppler were utilized during procedure). Indications:    Pulmonary Embolus I26.09  History:        Patient has no prior history of Echocardiogram examinations.                 Risk Factors:Hypertension. H/O Hyperlipidemia, Dementia,                 Pulmonary Embolism, Shortness of breath.  Sonographer:    BERNARDA ROCKS Referring Phys: Domingo.Diones ANASTASSIA DOUTOVA  IMPRESSIONS  1. Left ventricular ejection fraction, by estimation, is 60 to 65%. The left ventricle has normal function. The left ventricle has no regional wall motion abnormalities. Left ventricular diastolic parameters are consistent with Grade I diastolic dysfunction (impaired relaxation).  2. Pulmonary embolism. Right ventricular systolic function is mildly reduced. The right ventricular size is normal.  3. The mitral valve is normal in structure. Mild mitral valve regurgitation. No evidence of mitral stenosis.  4. The aortic valve is tricuspid. Aortic valve regurgitation is not visualized. No aortic stenosis is present.  5. The inferior vena cava is normal in size with <50% respiratory variability, suggesting right atrial pressure of 8 mmHg. FINDINGS  Left Ventricle: Left ventricular ejection fraction, by estimation, is 60 to 65%. The left ventricle has normal function. The left ventricle has no regional wall motion abnormalities. The left ventricular internal cavity size was normal in size. There is  no left ventricular hypertrophy. Left ventricular diastolic parameters are consistent with Grade I diastolic dysfunction (impaired relaxation). Right Ventricle: Pulmonary embolism. The right ventricular size is normal. No increase in right ventricular wall thickness. Right ventricular systolic function is mildly reduced. Left Atrium: Left atrial size was normal in size. Right Atrium: Right atrial size was normal in size. Pericardium: There is no evidence of pericardial effusion. Mitral Valve: The mitral valve is normal in structure. Mild mitral valve regurgitation. No evidence of mitral valve stenosis. MV peak gradient, 9.7 mmHg. The mean mitral valve gradient is 4.0 mmHg. Tricuspid Valve: The tricuspid valve  is normal in structure. Tricuspid valve regurgitation is not demonstrated. No evidence of tricuspid stenosis. Aortic Valve: The aortic valve is tricuspid. Aortic valve regurgitation is not visualized. No  aortic stenosis is present. Aortic valve mean gradient measures 3.0 mmHg. Aortic valve peak gradient measures 5.3 mmHg. Aortic valve area, by VTI measures 2.47 cm. Pulmonic Valve: The pulmonic valve was normal in structure. Pulmonic valve regurgitation is not visualized. No evidence of pulmonic stenosis. Aorta: The aortic root is normal in size and structure. Venous: The inferior vena cava is normal in size with less than 50% respiratory variability, suggesting right atrial pressure of 8 mmHg. IAS/Shunts: No atrial level shunt detected by color flow Doppler.  LEFT VENTRICLE PLAX 2D LVIDd:         3.40 cm     Diastology LVIDs:         2.40 cm     LV e' medial:    2.72 cm/s LV PW:         0.70 cm     LV E/e' medial:  28.3 LV IVS:        0.90 cm     LV e' lateral:   6.85 cm/s LVOT diam:     1.70 cm     LV E/e' lateral: 11.3 LV SV:         53 LV SV Index:   35 LVOT Area:     2.27 cm  LV Volumes (MOD) LV vol d, MOD A2C: 62.4 ml LV vol d, MOD A4C: 57.9 ml LV vol s, MOD A2C: 15.3 ml LV vol s, MOD A4C: 21.4 ml LV SV MOD A2C:     47.1 ml LV SV MOD A4C:     57.9 ml LV SV MOD BP:      43.5 ml RIGHT VENTRICLE            IVC RV Basal diam:  2.90 cm    IVC diam: 1.80 cm RV S prime:     8.59 cm/s TAPSE (M-mode): 1.3 cm LEFT ATRIUM             Index        RIGHT ATRIUM          Index LA diam:        2.70 cm 1.79 cm/m   RA Area:     9.54 cm LA Vol (A2C):   30.4 ml 20.13 ml/m  RA Volume:   19.80 ml 13.11 ml/m LA Vol (A4C):   33.4 ml 22.12 ml/m LA Biplane Vol: 33.5 ml 22.18 ml/m  AORTIC VALVE                    PULMONIC VALVE AV Area (Vmax):    2.51 cm     PV Vmax:          0.68 m/s AV Area (Vmean):   2.16 cm     PV Peak grad:     1.9 mmHg AV Area (VTI):     2.47 cm     PR End Diast Vel: 0.82 msec AV Vmax:           115.00 cm/s AV Vmean:          72.500 cm/s AV VTI:            0.216 m AV Peak Grad:      5.3 mmHg AV Mean Grad:      3.0 mmHg LVOT Vmax:  127.00 cm/s LVOT Vmean:        69.100 cm/s LVOT VTI:           0.235 m LVOT/AV VTI ratio: 1.09  AORTA Ao Root diam: 2.90 cm Ao Asc diam:  3.10 cm MITRAL VALVE MV Area (PHT): 5.42 cm     SHUNTS MV Area VTI:   1.74 cm     Systemic VTI:  0.24 m MV Peak grad:  9.7 mmHg     Systemic Diam: 1.70 cm MV Mean grad:  4.0 mmHg MV Vmax:       1.56 m/s MV Vmean:      91.1 cm/s MV Decel Time: 140 msec MV E velocity: 77.10 cm/s MV A velocity: 148.00 cm/s MV E/A ratio:  0.52 Oneil Parchment MD Electronically signed by Oneil Parchment MD Signature Date/Time: 08/07/2024/10:53:37 AM    Final    CT Angio Chest PE W and/or Wo Contrast Result Date: 08/06/2024 CLINICAL DATA:  Pulmonary embolism (PE) suspected, high prob EXAM: CT ANGIOGRAPHY CHEST WITH CONTRAST TECHNIQUE: Multidetector CT imaging of the chest was performed using the standard protocol during bolus administration of intravenous contrast. Multiplanar CT image reconstructions and MIPs were obtained to evaluate the vascular anatomy. RADIATION DOSE REDUCTION: This exam was performed according to the departmental dose-optimization program which includes automated exposure control, adjustment of the mA and/or kV according to patient size and/or use of iterative reconstruction technique. CONTRAST:  75mL OMNIPAQUE  IOHEXOL  350 MG/ML SOLN COMPARISON:  08/03/2024 FINDINGS: Cardiovascular: Pulmonary emboli noted in the right upper lobe, pulmonary emboli noted in all lobes of the right lung. No emboli seen on the left. Evidence of right heart strain with RV to LV ratio of 1.15. No evidence of aortic aneurysm. Coronary artery and aortic atherosclerosis. Mediastinum/Nodes: No mediastinal, hilar, or axillary adenopathy. Trachea and esophagus are unremarkable. Thyroid unremarkable. Very large hiatal hernia containing much of the stomach and transverse colon. Lungs/Pleura: Large bilateral pleural effusions. Compressive atelectasis in the lower lobes bilaterally. Upper Abdomen: No acute findings Musculoskeletal: Chest wall soft tissues are unremarkable. No  acute bony abnormality. Review of the MIP images confirms the above findings. IMPRESSION: Pulmonary emboli within all lobes of the right lung. CT evidence of right heart strain (RV/LV Ratio = 1.15) consistent with at least submassive (intermediate risk) PE. The presence of right heart strain has been associated with an increased risk of morbidity and mortality. Please refer to the Code PE Focused order set in EPIC. Large hiatal hernia containing most of the stomach and transverse colon. Large bilateral pleural effusions. Compressive atelectasis in the lower lobes. Aortic Atherosclerosis (ICD10-I70.0). These results were called by telephone at the time of interpretation on 08/06/2024 at 7:32 pm to provider HAYLEY NAASZ , who verbally acknowledged these results. Electronically Signed   By: Franky Crease M.D.   On: 08/06/2024 19:36   DG Pelvis Portable Result Date: 08/03/2024 EXAM: 1 or 2 VIEW(S) XRAY OF THE PELVIS 08/03/2024 04:54:00 PM COMPARISON: Earlier today. CLINICAL HISTORY: Deficient knowledge of open reduction and internal (ORIF) fixation of hip [id number]. Status post left arthoplasty. FINDINGS: BONES AND JOINTS: Left hip arthroplasty in place. No periprosthetic lucency or fracture. SOFT TISSUES: Subcutaneous soft tissue edema and emphysema consistent with postsurgical changes. IMPRESSION: 1. Left hip arthroplasty without immediate post-operative complication. Electronically signed by: Andrea Gasman MD 08/03/2024 06:08 PM EDT RP Workstation: [id number]   DG HIP UNILAT WITH PELVIS 1V LEFT Result Date: 08/03/2024 EXAM: 1 VIEW XRAY OF THE LEFT HIP 08/03/2024 03:50:00 PM COMPARISON: 08/03/24  CLINICAL HISTORY: [id number] Surgery, elective [id number]. FINDINGS: BONES AND JOINTS: Seven fluoroscopic spot views of the pelvis and left hip submitted from the operating room. Sequential images during left hip arthroplasty. Fluoro time: 3 sec, Fluoro dose: .2068 mGy. IMPRESSION: 1. Procedural fluoroscopy during left hip  arthroplasty . Electronically signed by: Andrea Gasman MD 08/03/2024 06:06 PM EDT RP Workstation: [id number]   DG C-Arm 1-60 Min-No Report Result Date: 08/03/2024 Fluoroscopy was utilized by the requesting physician.  No radiographic interpretation.   DG C-Arm 1-60 Min-No Report Result Date: 08/03/2024 Fluoroscopy was utilized by the requesting physician.  No radiographic interpretation.   CT HEAD WO CONTRAST Result Date: 08/03/2024 CLINICAL DATA:  Head trauma, fall, injury EXAM: CT HEAD WITHOUT CONTRAST TECHNIQUE: Contiguous axial images were obtained from the base of the skull through the vertex without intravenous contrast. RADIATION DOSE REDUCTION: This exam was performed according to the departmental dose-optimization program which includes automated exposure control, adjustment of the mA and/or kV according to patient size and/or use of iterative reconstruction technique. COMPARISON:  August 02, 2020 MRI FINDINGS: Brain: Nonspecific periventricular and subcortical white matter hypodensities, likely secondary to chronic microvascular disease. No acute intracranial hemorrhage. Diffuse cerebral atrophy. No midline shift. Basal cisterns are patent. Vascular: Unremarkable. Skull: No acute findings. Sinuses/Orbits: Opacification of the right sphenoid sinus. Other: None. IMPRESSION: No acute intracranial pathology. Chronic white matter changes and diffuse cerebral atrophy. Opacification of the right sphenoid sinus. Electronically Signed   By: Michaeline Blanch M.D.   On: 08/03/2024 12:07   CT CHEST ABDOMEN PELVIS WO CONTRAST Result Date: 08/03/2024 CLINICAL DATA:  Blunt poly trauma, fall, injury EXAM: CT CHEST, ABDOMEN AND PELVIS WITHOUT CONTRAST TECHNIQUE: Multidetector CT imaging of the chest, abdomen and pelvis was performed following the standard protocol without IV contrast. RADIATION DOSE REDUCTION: This exam was performed according to the departmental dose-optimization program which includes  automated exposure control, adjustment of the mA and/or kV according to patient size and/or use of iterative reconstruction technique. COMPARISON:  Same day additional trauma studies and priors FINDINGS: Limited evaluation of the solid organs and vasculature due to lack of IV contrast. CT CHEST FINDINGS Cardiovascular: Normal caliber aorta. Scattered aortic calcifications. No pericardial effusion. Mediastinum/Nodes: Large left-sided diaphragmatic hernia containing a majority of the stomach and multiple loops of large bowel with chronic organo-axial rotation of the stomach and mass effect on the adjacent heart and lungs. No lymphadenopathy. Lungs/Pleura: Similar appearance of biapical pleuroparenchymal scarring and calcified sub pleural plaques. Chronic left lower lobe atelectasis/consolidation related to large left diaphragmatic hernia. Right basilar atelectasis. No pleural effusions or pneumothorax. Musculoskeletal: Age-indeterminate L1 vertebral body compression fracture. CT ABDOMEN PELVIS FINDINGS Hepatobiliary: Unremarkable. Pancreas: Unremarkable. Spleen: Unremarkable. Adrenals/Urinary Tract: Adrenal glands are unremarkable. No hydronephrosis or nephrolithiasis. Stomach/Bowel: No evidence of bowel obstruction or inflammation. Colonic diverticulosis. Large left-sided diaphragmatic hernia as described above. Vascular/Lymphatic: Normal caliber abdominal aorta. Reproductive: Status post hysterectomy. No adnexal masses. Other: No free air or free fluid. Musculoskeletal: Age-indeterminate L1 vertebral body compression fracture. Mildly displaced and angulated left femoral neck fracture. Multilevel degenerative vertebral changes. IMPRESSION: Limited evaluation of the solid organs and vasculature without IV contrast. 1.  Redemonstration of left femoral neck fracture. 2.  Age indeterminate L1 vertebral body compression fracture. 3. Unchanged large, chronic left-sided diaphragmatic hernia with organoaxial rotation of the  stomach and mass effect on the adjacent lung and mediastinum. No evidence of bowel obstruction. Electronically Signed   By: Michaeline Blanch M.D.   On: 08/03/2024 12:03   CT  CERVICAL SPINE WO CONTRAST Result Date: 08/03/2024 CLINICAL DATA:  Poly trauma, blood. Unwitnessed fall. History of hypertension and dementia. EXAM: CT CERVICAL SPINE WITHOUT CONTRAST TECHNIQUE: Multidetector CT imaging of the cervical spine was performed without intravenous contrast. Multiplanar CT image reconstructions were also generated. RADIATION DOSE REDUCTION: This exam was performed according to the departmental dose-optimization program which includes automated exposure control, adjustment of the mA and/or kV according to patient size and/or use of iterative reconstruction technique. COMPARISON:  Chest CT 04/30/2018 FINDINGS: Alignment: 3 mm of anterolisthesis at C3-4 appears degenerative. Skull base and vertebrae: No evidence of acute fracture or traumatic subluxation. There is evidence for interbody and at least partial interfacetal ankylosis at the C2-3 and C4-5 levels. Mild asymmetric widening of the left C3-4 facet joint. Soft tissues and spinal canal: No prevertebral fluid or swelling. No visible canal hematoma. Disc levels: Multilevel spondylosis with disc space narrowing, uncinate spurring and facet hypertrophy. As above, interbody and at least partial interfacetal ankylosis at the C2-3 and C4-5 levels. Degenerative appearing anterolisthesis at C3-4 with resulting mild spinal stenosis and mild to moderate foraminal narrowing bilaterally. Upper chest: Chest CT findings dictated separately. Other: Bilateral carotid atherosclerosis. IMPRESSION: 1. No evidence of acute cervical spine fracture, traumatic subluxation or static signs of instability. 2. Multilevel cervical spondylosis as described with degenerative anterolisthesis at C3-4. 3. 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 Electronically Signed    By: Elsie Perone M.D.   On: 08/03/2024 11:43   DG Hip Unilat W or Wo Pelvis 2-3 Views Left Result Date: 08/03/2024 CLINICAL DATA:  Fall, left leg pain. EXAM: DG HIP (WITH OR WITHOUT PELVIS) 2-3V LEFT COMPARISON:  None Available. FINDINGS: Displaced left femoral neck fracture with superior displacement of the distal fracture fragment and varus angulation. No additional evidence of an acute fracture. Degenerative changes in the lower lumbosacral spine are incidentally imaged. IMPRESSION: Left femoral neck fracture. Electronically Signed   By: Newell Eke M.D.   On: 08/03/2024 10:24   DG Chest Port 1 View Result Date: 08/03/2024 CLINICAL DATA:  Fall, left leg pain. EXAM: PORTABLE CHEST 1 VIEW COMPARISON:  01/02/2024 and CT chest 04/30/2018. FINDINGS: Trachea is midline. Heart is at the upper limits of normal in size to mildly enlarged, stable. Thoracic aorta is calcified. Very large hiatal hernia in the lower left hemithorax. Lungs are clear. No pleural fluid. IMPRESSION: 1. No acute findings. 2. Large hiatal hernia. Electronically Signed   By: Newell Eke M.D.   On: 08/03/2024 10:24    DISCHARGE EXAMINATION: Vitals:   08/09/24 0438 08/09/24 1239 08/09/24 1944 08/10/24 0502  BP: (!) 145/78 (!) 123/58 134/83 129/67  Pulse: 90 95 92 79  Resp: 18 18 18 18   Temp: 98.6 F (37 C) 98.2 F (36.8 C) 98.4 F (36.9 C) (!) 97.5 F (36.4 C)  TempSrc: Oral Oral Oral Oral  SpO2: 97% 96% 97% 92%  Weight:      Height:       General appearance: Awake alert.  In no distress Resp: Clear to auscultation bilaterally.  Normal effort Cardio: S1-S2 is normal regular.  No S3-S4.  No rubs murmurs or bruit GI: Abdomen is soft.  Nontender nondistended.  Bowel sounds are present normal.  No masses organomegaly  DISPOSITION: To be determined  Discharge Instructions     Diet - low sodium heart healthy   Complete by: As directed    Discharge instructions   Complete by: As directed  Patient has been  transitioned to hospice  You were cared for by a hospitalist during your hospital stay. If you have any questions about your discharge medications or the care you received while you were in the hospital after you are discharged, you can call the unit and asked to speak with the hospitalist on call if the hospitalist that took care of you is not available. Once you are discharged, your primary care physician will handle any further medical issues. Please note that NO REFILLS for any discharge medications will be authorized once you are discharged, as it is imperative that you return to your primary care physician (or establish a relationship with a primary care physician if you do not have one) for your aftercare needs so that they can reassess your need for medications and monitor your lab values. If you do not have a primary care physician, you can call [phone] for a physician referral.   Discharge wound care:   Complete by: As directed    Reinforce dressing over the hip.  Needs suture removal in 2 weeks from surgery which was on 08/03/2024.  Please reach out to Dr. Jerri with orthopedics regarding this.   Increase activity slowly   Complete by: As directed          Allergies as of 08/10/2024   No Known Allergies      Medication List     STOP taking these medications    acetaminophen  325 MG tablet Commonly known as: TYLENOL    aspirin  EC 325 MG tablet   cyanocobalamin  1000 MCG tablet   feeding supplement Liqd   HYDROcodone -acetaminophen  5-325 MG tablet Commonly known as: NORCO/VICODIN   methocarbamol  500 MG tablet Commonly known as: ROBAXIN    VITAMIN D PO       TAKE these medications    Apixaban  Starter Pack (10mg  and 5mg ) Commonly known as: ELIQUIS  STARTER PACK Take as directed on package: start with two-5mg  tablets twice daily for 7 days. On day 8, switch to one-5mg  tablet twice daily.   apixaban  5 MG Tabs tablet Commonly known as: ELIQUIS  Take 1 tablet (5 mg total)  by mouth 2 (two) times daily. Please start only after you have completed the starter pack. Start taking on: September 09, 2024   cetirizine 10 MG tablet Commonly known as: ZYRTEC Take 10 mg by mouth daily.   docusate sodium  100 MG capsule Commonly known as: COLACE Take 1 capsule (100 mg total) by mouth 2 (two) times daily.   Flonase Allergy Relief 50 MCG/ACT nasal spray Generic drug: fluticasone Place 1 spray into both nostrils daily.   glycopyrrolate 1 MG tablet Commonly known as: ROBINUL Take 1 tablet (1 mg total) by mouth every 4 (four) hours as needed (excessive secretions).   haloperidol  1 MG tablet Commonly known as: HALDOL  Take 0.5 tablets (0.5 mg total) by mouth every 4 (four) hours as needed for agitation (or delirium).   meclizine  25 MG tablet Commonly known as: ANTIVERT  Take 25 mg by mouth every 8 (eight) hours as needed (Vertigo).   memantine  10 MG tablet Commonly known as: NAMENDA  Take 1 tablet (10 mg total) by mouth 2 (two) times daily. What changed: when to take this   mineral oil-hydrophilic petrolatum ointment Apply 1 Application topically in the morning and at bedtime.   mirtazapine  15 MG tablet Commonly known as: REMERON  Take 15 mg by mouth at bedtime.   morphine  20 MG/ML concentrated solution Commonly known as: ROXANOL Take 0.25 mLs (5 mg total)  by mouth every 2 (two) hours as needed for severe pain (pain score 7-10).   pantoprazole  20 MG tablet Commonly known as: PROTONIX  Take 20 mg by mouth daily.   polyethylene glycol 17 g packet Commonly known as: MIRALAX  / GLYCOLAX  Take 17 g by mouth daily as needed for mild constipation.   sertraline  50 MG tablet Commonly known as: ZOLOFT  Take 50 mg by mouth daily.               Discharge Care Instructions  (From admission, onward)           Start     Ordered   08/10/24 0000  Discharge wound care:       Comments: Reinforce dressing over the hip.  Needs suture removal in 2 weeks from  surgery which was on 08/03/2024.  Please reach out to Dr. Jerri with orthopedics regarding this.   08/10/24 1040              Follow-up Information     Jerri Kay HERO, MD Follow up in 1 week(s).   Specialty: Orthopedic Surgery Why: post hospitalization follow up Contact information: [address]  [place] [address] [phone]                 TOTAL DISCHARGE TIME: 35 minutes  Jaci Desanto Verdene  [facility] Pager on [email or web address]  08/10/2024, 11:15 AM

## 2024-08-10 NOTE — [place] FL2 (Signed)
 [place]  MEDICAID FL2 LEVEL OF CARE FORM     IDENTIFICATION  Patient Name: Mandy Adams Birthdate: 06/30/1932 Sex: female Admission Date (Current Location): 08/06/2024  [facility] and [place] Number:  Producer, television/film/video and Address:  [facility],  [address], [place] [address]      Provider Number: [id number]  Attending Physician Name and Address:  Verdene Purchase, MD  Relative Name and Phone Number:  [facility]) [phone]    Current Level of Care: Domiciliary (Rest home) Recommended Level of Care: Assisted Living Facility Prior Approval Number:    Date Approved/Denied:   PASRR Number:    Discharge Plan: Domiciliary (Rest home)    Current Diagnoses: Patient Active Problem List   Diagnosis Date Noted   Goals of care, counseling/discussion 08/09/2024   Palliative care encounter 08/08/2024   Counseling and coordination of care 08/08/2024   ACP (advance care planning) 08/08/2024   Debility 08/08/2024   Decreased oral intake 08/08/2024   DNR (do not resuscitate) 08/08/2024   High risk medication use 08/08/2024   Medication management 08/08/2024   Pain 08/08/2024   Dementia (HCC) 08/08/2024   Acute hypoxic respiratory failure (HCC) 08/08/2024   Acute pulmonary embolism (HCC) 08/06/2024   Pulmonary embolism (HCC) 08/06/2024   Anemia 08/06/2024   Acute respiratory failure with hypoxia (HCC) 08/06/2024   Closed subcapital fracture of neck of femur, left, initial encounter (HCC) 08/03/2024   Atrophic vaginitis 08/03/2024   Adjustment disorder 08/03/2024   Gastro-esophageal reflux disease without esophagitis 08/03/2024   Essential hypertension 08/03/2024   Eczema 08/03/2024   Hardening of the aorta (main artery of the heart) 08/03/2024   Moderate dementia (HCC) 08/03/2024   Osteoarthritis 08/03/2024   Pure hypercholesterolemia 08/03/2024   Pressure injury of buttock, stage 2 (HCC) 08/03/2024   Stage 3 chronic kidney disease (HCC)  08/03/2024   Mild protein malnutrition 08/03/2024   Lactic acidosis 08/03/2024   Hypokalemia 04/30/2018   Community acquired pneumonia 04/30/2018   Leukocytosis 04/30/2018   AKI (acute kidney injury) 04/30/2018   Diaphragmatic hernia without obstruction and without gangrene     Orientation RESPIRATION BLADDER Height & Weight     Self, Situation  Normal Incontinent Weight: 121 lb 7.6 oz (55.1 kg) Height:  5' (152.4 cm)  BEHAVIORAL SYMPTOMS/MOOD NEUROLOGICAL BOWEL NUTRITION STATUS      Incontinent Diet (Low Sodium Heart Healthy)  AMBULATORY STATUS COMMUNICATION OF NEEDS Skin   Extensive Assist Verbally Other (Comment) (Other (Comment) Buttocks Left, Pressure Injury Buttocks Right Stage 2, and Surgical Closed Surgical Incision Hip Left)                       Personal Care Assistance Level of Assistance    Bathing Assistance: Limited assistance   Dressing Assistance: Limited assistance     Functional Limitations Info    Sight Info: Adequate Hearing Info: Impaired Speech Info: Adequate    SPECIAL CARE FACTORS FREQUENCY                       Contractures Contractures Info: Not present    Additional Factors Info  Code Status, Allergies Code Status Info: DNR Allergies Info: NKA           Current Medications (08/10/2024):  This is the current hospital active medication list Current Facility-Administered Medications  Medication Dose Route Frequency Provider Last Rate Last Admin   acetaminophen  (TYLENOL ) tablet 650 mg  650 mg Oral Q6H PRN Doutova, Anastassia,  MD   650 mg at 08/10/24 9048   Or   acetaminophen  (TYLENOL ) suppository 650 mg  650 mg Rectal Q6H PRN Doutova, Anastassia, MD       antiseptic oral rinse (BIOTENE) solution 15 mL  15 mL Topical PRN Mims, Lauren W, DO       apixaban  (ELIQUIS ) tablet 10 mg  10 mg Oral BID Krishnan, Gokul, MD   10 mg at 08/10/24 9056   Followed by   [START ON 08/14/2024] apixaban  (ELIQUIS ) tablet 5 mg  5 mg Oral BID  Verdene Purchase, MD       artificial tears ophthalmic solution 1 drop  1 drop Both Eyes QID PRN Mims, Lauren W, DO       furosemide  (LASIX ) injection 20 mg  20 mg Intravenous Daily Krishnan, Gokul, MD   20 mg at 08/10/24 0943   glycopyrrolate (ROBINUL) tablet 1 mg  1 mg Oral Q4H PRN Clayton Tinnie ORN, DO       Or   glycopyrrolate (ROBINUL) injection 0.2 mg  0.2 mg Subcutaneous Q4H PRN Mims, Lauren W, DO       Or   glycopyrrolate (ROBINUL) injection 0.2 mg  0.2 mg Intravenous Q4H PRN Mims, Lauren W, DO       haloperidol  (HALDOL ) tablet 0.5 mg  0.5 mg Oral Q4H PRN Mims, Lauren W, DO       Or   haloperidol  (HALDOL ) 2 MG/ML solution 0.5 mg  0.5 mg Sublingual Q4H PRN Mims, Lauren W, DO       Or   haloperidol  lactate (HALDOL ) injection 0.5 mg  0.5 mg Intravenous Q4H PRN Mims, Lauren W, DO       HYDROmorphone  (DILAUDID ) injection 0.5 mg  0.5 mg Intravenous Q2H PRN Mims, Lauren W, DO   0.5 mg at 08/10/24 0136   meclizine  (ANTIVERT ) tablet 25 mg  25 mg Oral Q8H PRN Doutova, Anastassia, MD       memantine  (NAMENDA ) tablet 10 mg  10 mg Oral QHS Doutova, Anastassia, MD   10 mg at 08/09/24 2110   mirtazapine  (REMERON ) tablet 15 mg  15 mg Oral QHS Doutova, Anastassia, MD   15 mg at 08/09/24 2110   mupirocin  ointment (BACTROBAN ) 2 % 1 Application  1 Application Nasal BID Doutova, Anastassia, MD   1 Application at 08/10/24 9047   ondansetron  (ZOFRAN ) tablet 4 mg  4 mg Oral Q6H PRN Doutova, Anastassia, MD       Or   ondansetron  (ZOFRAN ) injection 4 mg  4 mg Intravenous Q6H PRN Doutova, Anastassia, MD   4 mg at 08/07/24 1039   Oral care mouth rinse  15 mL Mouth Rinse PRN Doutova, Anastassia, MD       oxyCODONE  (ROXICODONE  INTENSOL) 20 MG/ML concentrated solution 5 mg  5 mg Oral Q2H PRN Mims, Lauren W, DO       Or   oxyCODONE  (ROXICODONE  INTENSOL) 20 MG/ML concentrated solution 5 mg  5 mg Sublingual Q2H PRN Mims, Lauren W, DO       pantoprazole  (PROTONIX ) EC tablet 20 mg  20 mg Oral Daily Doutova, Anastassia, MD    20 mg at 08/10/24 0942   polyethylene glycol (MIRALAX  / GLYCOLAX ) packet 17 g  17 g Oral Daily PRN Doutova, Anastassia, MD       sertraline  (ZOLOFT ) tablet 50 mg  50 mg Oral Daily Doutova, Anastassia, MD   50 mg at 08/10/24 0942   sodium chloride  flush (NS) 0.9 % injection 10-40 mL  10-40  mL Intracatheter PRN Doutova, Anastassia, MD         Discharge Medications: Apixaban  Starter Pack (10mg  and 5mg ) Commonly known as: ELIQUIS  STARTER PACK Take as directed on package: start with two-5mg  tablets twice daily for 7 days. On day 8, switch to one-5mg  tablet twice daily.    apixaban  5 MG Tabs tablet Commonly known as: ELIQUIS  Take 1 tablet (5 mg total) by mouth 2 (two) times daily. Please start only after you have completed the starter pack. Start taking on: September 09, 2024    cetirizine 10 MG tablet Commonly known as: ZYRTEC Take 10 mg by mouth daily.    docusate sodium  100 MG capsule Commonly known as: COLACE Take 1 capsule (100 mg total) by mouth 2 (two) times daily.    Flonase Allergy Relief 50 MCG/ACT nasal spray Generic drug: fluticasone Place 1 spray into both nostrils daily.    glycopyrrolate 1 MG tablet Commonly known as: ROBINUL Take 1 tablet (1 mg total) by mouth every 4 (four) hours as needed (excessive secretions).    haloperidol  1 MG tablet Commonly known as: HALDOL  Take 0.5 tablets (0.5 mg total) by mouth every 4 (four) hours as needed for agitation (or delirium).    meclizine  25 MG tablet Commonly known as: ANTIVERT  Take 25 mg by mouth every 8 (eight) hours as needed (Vertigo).    memantine  10 MG tablet Commonly known as: NAMENDA  Take 1 tablet (10 mg total) by mouth 2 (two) times daily. What changed: when to take this    mineral oil-hydrophilic petrolatum ointment Apply 1 Application topically in the morning and at bedtime.    mirtazapine  15 MG tablet Commonly known as: REMERON  Take 15 mg by mouth at bedtime.    morphine  20 MG/ML concentrated  solution Commonly known as: ROXANOL Take 0.25 mLs (5 mg total) by mouth every 2 (two) hours as needed for severe pain (pain score 7-10).    pantoprazole  20 MG tablet Commonly known as: PROTONIX  Take 20 mg by mouth daily.    polyethylene glycol 17 g packet Commonly known as: MIRALAX  / GLYCOLAX  Take 17 g by mouth daily as needed for mild constipation.    sertraline  50 MG tablet Commonly known as: ZOLOFT  Take 50 mg by mouth daily.    Relevant Imaging Results:  Relevant Lab Results:   Additional Information SSN: [id number]  Heather DELENA Saltness, LCSW

## 2024-09-12 DEATH — deceased
